# Patient Record
Sex: Female | Born: 1950
Health system: Southern US, Community
[De-identification: ages and names within clinical notes are randomized; demographics above are authoritative.]

## PROBLEM LIST (undated history)

## (undated) DIAGNOSIS — B029 Zoster without complications: Secondary | ICD-10-CM

## (undated) DIAGNOSIS — Z9889 Other specified postprocedural states: Secondary | ICD-10-CM

## (undated) DIAGNOSIS — K2210 Ulcer of esophagus without bleeding: Secondary | ICD-10-CM

## (undated) DIAGNOSIS — E785 Hyperlipidemia, unspecified: Secondary | ICD-10-CM

## (undated) DIAGNOSIS — I10 Essential (primary) hypertension: Secondary | ICD-10-CM

## (undated) DIAGNOSIS — E039 Hypothyroidism, unspecified: Secondary | ICD-10-CM

## (undated) DIAGNOSIS — I5189 Other ill-defined heart diseases: Secondary | ICD-10-CM

## (undated) DIAGNOSIS — C4490 Unspecified malignant neoplasm of skin, unspecified: Secondary | ICD-10-CM

## (undated) DIAGNOSIS — I2510 Atherosclerotic heart disease of native coronary artery without angina pectoris: Secondary | ICD-10-CM

## (undated) DIAGNOSIS — E119 Type 2 diabetes mellitus without complications: Secondary | ICD-10-CM

## (undated) DIAGNOSIS — M1990 Unspecified osteoarthritis, unspecified site: Secondary | ICD-10-CM

## (undated) DIAGNOSIS — T8859XA Other complications of anesthesia, initial encounter: Secondary | ICD-10-CM

## (undated) DIAGNOSIS — I700 Atherosclerosis of aorta: Secondary | ICD-10-CM

## (undated) HISTORY — DX: Ulcer of esophagus without bleeding: K22.10

## (undated) HISTORY — DX: Zoster without complications: B02.9

## (undated) HISTORY — DX: Essential (primary) hypertension: I10

## (undated) HISTORY — DX: Unspecified malignant neoplasm of skin, unspecified: C44.90

## (undated) HISTORY — PX: ABDOMINAL HYSTERECTOMY: SHX81

## (undated) HISTORY — DX: Hyperlipidemia, unspecified: E78.5

---

## 1998-07-28 DIAGNOSIS — K2210 Ulcer of esophagus without bleeding: Secondary | ICD-10-CM

## 1998-07-28 HISTORY — DX: Ulcer of esophagus without bleeding: K22.10

## 1999-07-29 DIAGNOSIS — B029 Zoster without complications: Secondary | ICD-10-CM

## 1999-07-29 HISTORY — DX: Zoster without complications: B02.9

## 2005-01-23 ENCOUNTER — Ambulatory Visit: Payer: Self-pay | Admitting: Unknown Physician Specialty

## 2006-01-01 ENCOUNTER — Ambulatory Visit: Payer: Self-pay | Admitting: General Surgery

## 2006-01-21 ENCOUNTER — Ambulatory Visit: Payer: Self-pay | Admitting: Unknown Physician Specialty

## 2007-03-18 ENCOUNTER — Ambulatory Visit: Payer: Self-pay | Admitting: Unknown Physician Specialty

## 2008-03-30 ENCOUNTER — Ambulatory Visit: Payer: Self-pay | Admitting: Unknown Physician Specialty

## 2009-05-09 ENCOUNTER — Ambulatory Visit: Payer: Self-pay | Admitting: Unknown Physician Specialty

## 2010-07-12 ENCOUNTER — Ambulatory Visit: Payer: Self-pay | Admitting: Unknown Physician Specialty

## 2010-07-17 ENCOUNTER — Ambulatory Visit: Payer: Self-pay | Admitting: Unknown Physician Specialty

## 2010-07-23 ENCOUNTER — Ambulatory Visit: Payer: Self-pay | Admitting: Unknown Physician Specialty

## 2011-01-28 ENCOUNTER — Ambulatory Visit: Payer: Self-pay | Admitting: Internal Medicine

## 2011-05-15 ENCOUNTER — Other Ambulatory Visit: Payer: Self-pay | Admitting: Internal Medicine

## 2011-05-15 MED ORDER — LISINOPRIL 10 MG PO TABS: 10.0000 mg | ORAL_TABLET | Freq: Three times a day (TID) | ORAL | Status: DC

## 2011-05-15 NOTE — Telephone Encounter (Signed)
Patient has signed medical release at [facility], she states [facility] has sent refill request

## 2011-05-16 ENCOUNTER — Other Ambulatory Visit: Payer: Self-pay | Admitting: Internal Medicine

## 2011-05-16 MED ORDER — LISINOPRIL 30 MG PO TABS: 30.0000 mg | ORAL_TABLET | Freq: Every day | ORAL | Status: DC

## 2011-08-01 ENCOUNTER — Encounter: Payer: Self-pay | Admitting: Internal Medicine

## 2011-08-05 ENCOUNTER — Encounter: Payer: Self-pay | Admitting: Internal Medicine

## 2011-08-15 ENCOUNTER — Ambulatory Visit (INDEPENDENT_AMBULATORY_CARE_PROVIDER_SITE_OTHER): Payer: BC Managed Care – PPO | Admitting: Internal Medicine

## 2011-08-15 ENCOUNTER — Other Ambulatory Visit (HOSPITAL_COMMUNITY)
Admission: RE | Admit: 2011-08-15 | Discharge: 2011-08-15 | Disposition: A | Discharge status: Home / Self Care | Payer: BC Managed Care – PPO | Source: Ambulatory Visit | Attending: Internal Medicine | Admitting: Internal Medicine

## 2011-08-15 ENCOUNTER — Encounter: Payer: Self-pay | Admitting: Internal Medicine

## 2011-08-15 DIAGNOSIS — K649 Unspecified hemorrhoids: Secondary | ICD-10-CM

## 2011-08-15 DIAGNOSIS — I10 Essential (primary) hypertension: Secondary | ICD-10-CM | POA: Insufficient documentation

## 2011-08-15 DIAGNOSIS — Z0000 Encounter for general adult medical examination without abnormal findings: Secondary | ICD-10-CM | POA: Insufficient documentation

## 2011-08-15 DIAGNOSIS — Z01419 Encounter for gynecological examination (general) (routine) without abnormal findings: Secondary | ICD-10-CM | POA: Insufficient documentation

## 2011-08-15 DIAGNOSIS — R87810 Cervical high risk human papillomavirus (HPV) DNA test positive: Secondary | ICD-10-CM | POA: Insufficient documentation

## 2011-08-15 DIAGNOSIS — A630 Anogenital (venereal) warts: Secondary | ICD-10-CM | POA: Insufficient documentation

## 2011-08-15 DIAGNOSIS — Z23 Encounter for immunization: Secondary | ICD-10-CM

## 2011-08-15 LAB — COMPREHENSIVE METABOLIC PANEL
AST: 19 U/L (ref 0–37)
Alkaline Phosphatase: 52 U/L (ref 39–117)
BUN: 17 mg/dL (ref 6–23)
Glucose, Bld: 106 mg/dL — ABNORMAL HIGH (ref 70–99)
Potassium: 3.8 mEq/L (ref 3.5–5.3)
Sodium: 139 mEq/L (ref 135–145)
Total Bilirubin: 0.3 mg/dL (ref 0.3–1.2)

## 2011-08-15 MED ORDER — IMIQUIMOD 5 % EX CREA: TOPICAL_CREAM | CUTANEOUS | Status: DC

## 2011-08-15 MED ORDER — LISINOPRIL 30 MG PO TABS: 30.0000 mg | ORAL_TABLET | Freq: Every day | ORAL | Status: DC

## 2011-08-15 NOTE — Progress Notes (Signed)
Subjective:    Patient ID: Alicia Ramos, female    DOB: May 24, 1951, 61 y.o.   MRN: [id number]  HPI 61 year old female with a history of hypertension presents for her annual exam. She reports that she's been doing well. She notes that over the last several months there were 2 instances in which her blood pressure was slightly elevated. In the first instance, she had fallen and injured her back and was in significant pain. She notes that during that time her blood pressure was elevated greater than 140/90. She also notes an instance in which she developed a painful hemorrhoid. She also reports that her blood pressure was elevated during that event. Aside from this, her blood pressure has been well-controlled. She denies any chest pain, palpitations, headache, or shortness of breath. She reports full compliance with her medication.  Last year, she underwent laser ablation of some genital warts. She notes a new lesion in her right groin which she is concerned may be a recurrent wart. This lesion is not painful.  She also notes that a couple of months ago she developed an area to the left of her rectum which was bulging. She assumed that this area was hemorrhoid and started applying Vaseline to the area. She reports significant improvement in the pain from this lesion with the use of Vaseline. She reports that the lesion has gotten gradually smaller. She denies any rectal bleeding. She denies any constipation or diarrhea. She denies any abdominal pain.  Outpatient Encounter Prescriptions as of 08/15/2011  Medication Sig Dispense Refill  . cholecalciferol (VITAMIN D) 1000 UNITS tablet Take 2,000 Units by mouth daily.      Marland Kitchen lisinopril (PRINIVIL,ZESTRIL) 30 MG tablet Take 1 tablet (30 mg total) by mouth daily.  30 tablet  11  . Multiple Vitamins-Minerals (MULTIVITAMIN WITH MINERALS) tablet Take 1 tablet by mouth daily.      . imiquimod (ALDARA) 5 % cream Apply topically 3 (three) times a week.  12 each  0     Review of Systems  Constitutional: Negative for fever, chills, appetite change, fatigue and unexpected weight change.  HENT: Negative for ear pain, congestion, sore throat, trouble swallowing, neck pain, voice change and sinus pressure.   Eyes: Negative for visual disturbance.  Respiratory: Negative for cough, shortness of breath, wheezing and stridor.   Cardiovascular: Negative for chest pain, palpitations and leg swelling.  Gastrointestinal: Positive for rectal pain. Negative for nausea, vomiting, abdominal pain, diarrhea, constipation, blood in stool, abdominal distention and anal bleeding.  Genitourinary: Positive for genital sores. Negative for dysuria and flank pain.  Musculoskeletal: Negative for myalgias, arthralgias and gait problem.  Skin: Negative for color change and rash.  Neurological: Negative for dizziness and headaches.  Hematological: Negative for adenopathy. Does not bruise/bleed easily.  Psychiatric/Behavioral: Negative for suicidal ideas, sleep disturbance and dysphoric mood. The patient is not nervous/anxious.    BP 138/72  Pulse 99  Temp(Src) 97.9 F (36.6 C) (Oral)  Ht 5\' 9"  (1.753 m)  Wt 195 lb (88.451 kg)  BMI 28.80 kg/m2  SpO2 99%    Objective:   Physical Exam  Constitutional: She is oriented to person, place, and time. She appears well-developed and well-nourished. No distress.  HENT:  Head: Normocephalic and atraumatic.  Right Ear: External ear normal.  Left Ear: External ear normal.  Nose: Nose normal.  Mouth/Throat: Oropharynx is clear and moist. No oropharyngeal exudate.  Eyes: Conjunctivae are normal. Pupils are equal, round, and reactive to light. Right eye exhibits no  discharge. Left eye exhibits no discharge. No scleral icterus.  Neck: Normal range of motion. Neck supple. No tracheal deviation present. No thyromegaly present.  Cardiovascular: Normal rate, regular rhythm, normal heart sounds and intact distal pulses.  Exam reveals no gallop  and no friction rub.   No murmur heard. Pulmonary/Chest: Effort normal and breath sounds normal. No respiratory distress. She has no wheezes. She has no rales. She exhibits no tenderness. Right breast exhibits no inverted nipple, no mass, no nipple discharge, no skin change and no tenderness. Left breast exhibits no inverted nipple, no mass, no nipple discharge, no skin change and no tenderness. Breasts are symmetrical.  Abdominal: Soft. Bowel sounds are normal. She exhibits no distension and no mass. There is no tenderness. There is no rebound and no guarding.  Genitourinary: Rectum normal and vagina normal.    No breast swelling, tenderness, discharge or bleeding. Pelvic exam was performed with patient prone. There is no rash, tenderness or lesion on the right labia. There is no rash, tenderness or lesion on the left labia. Right adnexum displays no mass, no tenderness and no fullness. Left adnexum displays no mass, no tenderness and no fullness. No erythema or tenderness around the vagina. No vaginal discharge found.       Cervix is surgically absent. There is vaginal atrophy  Musculoskeletal: Normal range of motion. She exhibits no edema and no tenderness.  Lymphadenopathy:    She has no cervical adenopathy.  Neurological: She is alert and oriented to person, place, and time. No cranial nerve deficit. She exhibits normal muscle tone. Coordination normal.  Skin: Skin is warm and dry. No rash noted. She is not diaphoretic. No erythema. No pallor.  Psychiatric: She has a normal mood and affect. Her behavior is normal. Judgment and thought content normal.          Assessment & Plan:

## 2011-08-15 NOTE — Assessment & Plan Note (Signed)
Pap sent today and is pending. Mammogram is ordered. Influenza vaccine given today.

## 2011-08-15 NOTE — Assessment & Plan Note (Signed)
Patient noted to have hemorrhoid on exam. She has been applying Vaseline to the area and reports significant improvement in her pain. We'll continue to monitor for now. We discussed potentially using Analpram if no improvement.

## 2011-08-15 NOTE — Assessment & Plan Note (Signed)
Blood pressure is well-controlled today. We discussed goal of less than 140/90 with ideal blood pressure of 120/80. Patient will continue to monitor blood pressure at home. She will continue lisinopril. We will check renal function with labs today. She will followup in 6 months.

## 2011-08-15 NOTE — Assessment & Plan Note (Signed)
Patient with a history of genital warts status post laser ablation. She has a new lesion in her right groin. Will try applying Aldara cream. We discussed using this cream 3 times weekly and leaving the cream on for 6-10 hours at a time. She will then washed the area off with warm water. She will call if any problems develop. She will also call if no improvement in the lesion over the next couple of months.

## 2011-08-19 ENCOUNTER — Ambulatory Visit: Payer: Self-pay | Admitting: Internal Medicine

## 2011-08-20 ENCOUNTER — Ambulatory Visit: Payer: Self-pay | Admitting: Internal Medicine

## 2011-08-20 ENCOUNTER — Telehealth: Payer: Self-pay | Admitting: Internal Medicine

## 2011-08-20 NOTE — Telephone Encounter (Signed)
Mammogram and [place] left breast most consistent with inflammatory lymph nodes. Recommended repeat mammogram in 01/2012.

## 2011-08-21 NOTE — Telephone Encounter (Signed)
Mychart message sent.

## 2011-08-25 ENCOUNTER — Telehealth: Payer: Self-pay | Admitting: *Deleted

## 2011-08-25 NOTE — Telephone Encounter (Signed)
Message copied by Vernie Murders on Mon Aug 25, 2011  8:14 AM ------      Message from: [place], Victorino Dike A      Created: Fri Aug 22, 2011  2:58 PM       PAP was normal but HPV was positive. I would like to repeat PAP at visit in 01/2012

## 2011-09-16 ENCOUNTER — Other Ambulatory Visit: Payer: Self-pay | Admitting: Internal Medicine

## 2011-09-30 ENCOUNTER — Encounter: Payer: Self-pay | Admitting: Internal Medicine

## 2011-10-01 ENCOUNTER — Encounter: Payer: Self-pay | Admitting: Internal Medicine

## 2011-12-15 ENCOUNTER — Other Ambulatory Visit: Payer: Self-pay | Admitting: Internal Medicine

## 2011-12-23 ENCOUNTER — Telehealth: Payer: Self-pay | Admitting: Internal Medicine

## 2011-12-23 DIAGNOSIS — R928 Other abnormal and inconclusive findings on diagnostic imaging of breast: Secondary | ICD-10-CM

## 2011-12-23 NOTE — Telephone Encounter (Signed)
Order placed in EMR/SLS

## 2011-12-23 NOTE — Telephone Encounter (Signed)
Alicia Ramos called needing verbal ok to get [name] to come in for 6 month follow up uni left diagnostic

## 2011-12-23 NOTE — Telephone Encounter (Signed)
Fine to order 

## 2012-01-15 ENCOUNTER — Other Ambulatory Visit: Payer: Self-pay | Admitting: Internal Medicine

## 2012-01-20 ENCOUNTER — Telehealth: Payer: Self-pay | Admitting: Internal Medicine

## 2012-01-20 DIAGNOSIS — R928 Other abnormal and inconclusive findings on diagnostic imaging of breast: Secondary | ICD-10-CM

## 2012-01-20 NOTE — Telephone Encounter (Signed)
Its time for pt to have her Uni left diagnostic mammogram need order

## 2012-01-22 NOTE — Telephone Encounter (Signed)
Patient has an appointment on 02/20/12 at 8:30 and she is aware of the appointment.

## 2012-02-12 ENCOUNTER — Ambulatory Visit: Payer: BC Managed Care – PPO | Admitting: Internal Medicine

## 2012-03-09 ENCOUNTER — Ambulatory Visit: Payer: Self-pay

## 2012-03-09 ENCOUNTER — Ambulatory Visit (INDEPENDENT_AMBULATORY_CARE_PROVIDER_SITE_OTHER): Payer: Self-pay | Admitting: Internal Medicine

## 2012-03-09 ENCOUNTER — Encounter: Payer: Self-pay | Admitting: Internal Medicine

## 2012-03-09 VITALS — BP 140/80 | HR 73 | Temp 98.2°F | Ht 69.0 in | Wt 195.2 lb

## 2012-03-09 DIAGNOSIS — B977 Papillomavirus as the cause of diseases classified elsewhere: Secondary | ICD-10-CM

## 2012-03-09 DIAGNOSIS — I10 Essential (primary) hypertension: Secondary | ICD-10-CM

## 2012-03-09 DIAGNOSIS — IMO0002 Reserved for concepts with insufficient information to code with codable children: Secondary | ICD-10-CM | POA: Insufficient documentation

## 2012-03-09 DIAGNOSIS — R8789 Other abnormal findings in specimens from female genital organs: Secondary | ICD-10-CM

## 2012-03-09 DIAGNOSIS — A630 Anogenital (venereal) warts: Secondary | ICD-10-CM

## 2012-03-09 LAB — COMPREHENSIVE METABOLIC PANEL
AST: 22 U/L (ref 0–37)
Albumin: 4.5 g/dL (ref 3.5–5.2)
Alkaline Phosphatase: 61 U/L (ref 39–117)
Glucose, Bld: 89 mg/dL (ref 70–99)
Potassium: 4.1 mEq/L (ref 3.5–5.1)
Sodium: 137 mEq/L (ref 135–145)
Total Protein: 7.7 g/dL (ref 6.0–8.3)

## 2012-03-09 MED ORDER — LISINOPRIL 40 MG PO TABS: 40.0000 mg | ORAL_TABLET | Freq: Every day | ORAL | Status: DC

## 2012-03-09 NOTE — Assessment & Plan Note (Signed)
Pap performed in January 2013 showed normal cytology but was HPV positive. Discussed option of repeating Pap today or waiting until followup visit in January 2014. Patient would prefer to wait. Will plan to repeat Pap at that time.

## 2012-03-09 NOTE — Assessment & Plan Note (Signed)
Blood pressure has been slightly elevated. Will increase dose of lisinopril to 40 mg daily. Patient will monitor blood pressure and call if consistently greater than 140/90. Will check renal function with labs today.

## 2012-03-09 NOTE — Assessment & Plan Note (Signed)
Persistent genital warts despite treatment with Aldara. Recommended referral to GYN for laser removal. Patient would like to hold off for now. Will readdress that visit in 6 months.

## 2012-03-09 NOTE — Progress Notes (Signed)
Subjective:    Patient ID: Alicia Ramos, female    DOB: 11-Nov-1950, 61 y.o.   MRN: [id number]  HPI 61 year old female with history of hypertension presents for followup. She reports she is generally doing well. She notes that her blood pressures at home have been mostly around 140/90. She denies any headache, chest pain, palpitations. She reports full compliance with her medication.  In regards to her history of genital warts, she reports that reports have not resolved with use of Aldara. However, she is not interested in referral for laser removal at this time.   Outpatient Encounter Prescriptions as of 03/09/2012  Medication Sig Dispense Refill  . cholecalciferol (VITAMIN D) 1000 UNITS tablet Take 2,000 Units by mouth daily.      Marland Kitchen lisinopril (PRINIVIL,ZESTRIL) 40 MG tablet Take 1 tablet (40 mg total) by mouth daily.  30 tablet  11  . Multiple Vitamins-Minerals (MULTIVITAMIN WITH MINERALS) tablet Take 1 tablet by mouth daily.      Marland Kitchen DISCONTD: lisinopril (PRINIVIL,ZESTRIL) 30 MG tablet Take 1 tablet (30 mg total) by mouth daily.  30 tablet  11  . DISCONTD: imiquimod (ALDARA) 5 % cream APPLY TOPICALLY 3 TIMES PER WEEK AS     DIRECTED  12 each  2   BP 140/80  Pulse 73  Temp 98.2 F (36.8 C) (Oral)  Ht 5\' 9"  (1.753 m)  Wt 195 lb 4 oz (88.565 kg)  BMI 28.83 kg/m2  SpO2 94%   Review of Systems  Constitutional: Negative for fever, chills, appetite change, fatigue and unexpected weight change.  HENT: Negative for ear pain, congestion, sore throat, trouble swallowing, neck pain, voice change and sinus pressure.   Eyes: Negative for visual disturbance.  Respiratory: Negative for cough, shortness of breath, wheezing and stridor.   Cardiovascular: Negative for chest pain, palpitations and leg swelling.  Gastrointestinal: Negative for nausea, vomiting, abdominal pain, diarrhea, constipation, blood in stool, abdominal distention and anal bleeding.  Genitourinary: Positive for genital sores.  Negative for dysuria and flank pain.  Musculoskeletal: Negative for myalgias, arthralgias and gait problem.  Skin: Negative for color change and rash.  Neurological: Negative for dizziness and headaches.  Hematological: Negative for adenopathy. Does not bruise/bleed easily.  Psychiatric/Behavioral: Negative for suicidal ideas, disturbed wake/sleep cycle and dysphoric mood. The patient is not nervous/anxious.        Objective:   Physical Exam  Constitutional: She is oriented to person, place, and time. She appears well-developed and well-nourished. No distress.  HENT:  Head: Normocephalic and atraumatic.  Right Ear: External ear normal.  Left Ear: External ear normal.  Nose: Nose normal.  Mouth/Throat: Oropharynx is clear and moist. No oropharyngeal exudate.  Eyes: Conjunctivae are normal. Pupils are equal, round, and reactive to light. Right eye exhibits no discharge. Left eye exhibits no discharge. No scleral icterus.  Neck: Normal range of motion. Neck supple. No tracheal deviation present. No thyromegaly present.  Cardiovascular: Normal rate, regular rhythm, normal heart sounds and intact distal pulses.  Exam reveals no gallop and no friction rub.   No murmur heard. Pulmonary/Chest: Effort normal and breath sounds normal. No respiratory distress. She has no wheezes. She has no rales. She exhibits no tenderness.  Musculoskeletal: Normal range of motion. She exhibits no edema and no tenderness.  Lymphadenopathy:    She has no cervical adenopathy.  Neurological: She is alert and oriented to person, place, and time. No cranial nerve deficit. She exhibits normal muscle tone. Coordination normal.  Skin: Skin is warm and  dry. No rash noted. She is not diaphoretic. No erythema. No pallor.  Psychiatric: She has a normal mood and affect. Her behavior is normal. Judgment and thought content normal.          Assessment & Plan:

## 2012-03-09 NOTE — Patient Instructions (Signed)
Supplements which might help with hot flashes - Black Cohash, Soy, I-cool.

## 2012-09-04 LAB — HM MAMMOGRAPHY

## 2012-09-12 ENCOUNTER — Other Ambulatory Visit: Payer: Self-pay

## 2012-09-15 ENCOUNTER — Ambulatory Visit: Payer: Self-pay

## 2013-03-04 ENCOUNTER — Ambulatory Visit (INDEPENDENT_AMBULATORY_CARE_PROVIDER_SITE_OTHER): Payer: Self-pay | Admitting: Internal Medicine

## 2013-03-04 ENCOUNTER — Encounter: Payer: Self-pay | Admitting: Internal Medicine

## 2013-03-04 VITALS — BP 120/80 | HR 73 | Temp 98.3°F | Wt 193.0 lb

## 2013-03-04 DIAGNOSIS — I10 Essential (primary) hypertension: Secondary | ICD-10-CM

## 2013-03-04 DIAGNOSIS — R8789 Other abnormal findings in specimens from female genital organs: Secondary | ICD-10-CM

## 2013-03-04 DIAGNOSIS — B977 Papillomavirus as the cause of diseases classified elsewhere: Secondary | ICD-10-CM

## 2013-03-04 DIAGNOSIS — IMO0002 Reserved for concepts with insufficient information to code with codable children: Secondary | ICD-10-CM

## 2013-03-04 LAB — MICROALBUMIN / CREATININE URINE RATIO
Creatinine,U: 117 mg/dL
Microalb Creat Ratio: 11.4 mg/g (ref 0.0–30.0)
Microalb, Ur: 13.3 mg/dL — ABNORMAL HIGH (ref 0.0–1.9)

## 2013-03-04 LAB — COMPREHENSIVE METABOLIC PANEL
AST: 25 U/L (ref 0–37)
Alkaline Phosphatase: 62 U/L (ref 39–117)
BUN: 16 mg/dL (ref 6–23)
Creatinine, Ser: 1 mg/dL (ref 0.4–1.2)
Glucose, Bld: 108 mg/dL — ABNORMAL HIGH (ref 70–99)
Potassium: 4.9 mEq/L (ref 3.5–5.1)
Total Bilirubin: 0.8 mg/dL (ref 0.3–1.2)

## 2013-03-04 MED ORDER — LISINOPRIL 40 MG PO TABS: 40.0000 mg | ORAL_TABLET | Freq: Every day | ORAL | Status: DC

## 2013-03-04 NOTE — Progress Notes (Signed)
Subjective:    Patient ID: Alicia Ramos, female    DOB: 1950/11/26, 62 y.o.   MRN: [id number]  HPI 62 year old female with history of hypertension and abnormal Pap smear presents for followup. She reports she has been lost to followup because she lost her job and did not have health insurance. She has been compliant with her lisinopril. She has not been checking her blood pressure at home. She denies any headache, chest pain, palpitations. She has been feeling well. She is exercising by walking daily. She is trying to follow a healthy diet.  In regards to Pap smear she did not followup with GYN. She reports she has been seen at [facility] as part of a [facility] care program. She underwent mammogram which was reportedly normal. She reports that they have offered to complete her Pap smear there. This has not yet been done.  Outpatient Prescriptions Prior to Visit  Medication Sig Dispense Refill  . cholecalciferol (VITAMIN D) 1000 UNITS tablet Take 2,000 Units by mouth daily.      . Multiple Vitamins-Minerals (MULTIVITAMIN WITH MINERALS) tablet Take 1 tablet by mouth daily.      Marland Kitchen lisinopril (PRINIVIL,ZESTRIL) 40 MG tablet Take 1 tablet (40 mg total) by mouth daily.  30 tablet  11   No facility-administered medications prior to visit.    Review of Systems  Constitutional: Negative for fever, chills, appetite change, fatigue and unexpected weight change.  HENT: Negative for ear pain, congestion, sore throat, trouble swallowing, neck pain, voice change and sinus pressure.   Eyes: Negative for visual disturbance.  Respiratory: Negative for cough, shortness of breath, wheezing and stridor.   Cardiovascular: Negative for chest pain, palpitations and leg swelling.  Gastrointestinal: Negative for nausea, vomiting, abdominal pain, diarrhea, constipation, blood in stool, abdominal distention and anal bleeding.  Genitourinary: Negative for dysuria and flank pain.  Musculoskeletal:  Negative for myalgias, arthralgias and gait problem.  Skin: Negative for color change and rash.  Neurological: Negative for dizziness and headaches.  Hematological: Negative for adenopathy. Does not bruise/bleed easily.  Psychiatric/Behavioral: Negative for suicidal ideas, sleep disturbance and dysphoric mood. The patient is not nervous/anxious.        Objective:   Physical Exam  Constitutional: She is oriented to person, place, and time. She appears well-developed and well-nourished. No distress.  HENT:  Head: Normocephalic and atraumatic.  Right Ear: External ear normal.  Left Ear: External ear normal.  Nose: Nose normal.  Mouth/Throat: Oropharynx is clear and moist. No oropharyngeal exudate.  Eyes: Conjunctivae are normal. Pupils are equal, round, and reactive to light. Right eye exhibits no discharge. Left eye exhibits no discharge. No scleral icterus.  Neck: Normal range of motion. Neck supple. No tracheal deviation present. No thyromegaly present.  Cardiovascular: Normal rate, regular rhythm, normal heart sounds and intact distal pulses.  Exam reveals no gallop and no friction rub.   No murmur heard. Pulmonary/Chest: Effort normal and breath sounds normal. No accessory muscle usage. Not tachypneic. No respiratory distress. She has no decreased breath sounds. She has no wheezes. She has no rhonchi. She has no rales. She exhibits no tenderness.  Musculoskeletal: Normal range of motion. She exhibits no edema and no tenderness.  Lymphadenopathy:    She has no cervical adenopathy.  Neurological: She is alert and oriented to person, place, and time. No cranial nerve deficit. She exhibits normal muscle tone. Coordination normal.  Skin: Skin is warm and dry. No rash noted. She is not diaphoretic. No erythema.  No pallor.  Psychiatric: She has a normal mood and affect. Her behavior is normal. Judgment and thought content normal.          Assessment & Plan:

## 2013-03-04 NOTE — Assessment & Plan Note (Signed)
BP Readings from Last 3 Encounters:  03/04/13 120/80  03/09/12 140/80  08/15/11 138/72   Blood pressure well-controlled on lisinopril. Will check renal function and urine micro-albumen with labs today.

## 2013-03-04 NOTE — Assessment & Plan Note (Signed)
Pap smear from January 2013 was normal but HPV was positive. We discussed the importance of repeating Pap smear to be sure that she has cleared HPV infection. She would like to hold off on this for now asshe does not have health insurance coverage. Encouraged her to either schedule Pap through the [facility] care program at [facility] or to establish [facility] care through [place]. Alternatively she could have Pap smear performed at the health department. She would prefer to set up [facility] care through [facility] and we will schedule tentative physical exam with Pap for 4 weeks from now.

## 2013-05-10 ENCOUNTER — Encounter: Payer: Self-pay | Admitting: Internal Medicine

## 2013-05-10 ENCOUNTER — Ambulatory Visit (INDEPENDENT_AMBULATORY_CARE_PROVIDER_SITE_OTHER): Payer: Self-pay | Admitting: Internal Medicine

## 2013-05-10 VITALS — BP 120/76 | HR 80 | Temp 97.8°F | Resp 12 | Wt 197.5 lb

## 2013-05-10 DIAGNOSIS — R05 Cough: Secondary | ICD-10-CM | POA: Insufficient documentation

## 2013-05-10 DIAGNOSIS — R059 Cough, unspecified: Secondary | ICD-10-CM

## 2013-05-10 MED ORDER — BENZONATATE 100 MG PO CAPS: 100.0000 mg | ORAL_CAPSULE | Freq: Three times a day (TID) | ORAL | Status: DC | PRN

## 2013-05-10 MED ORDER — ZOSTER VACCINE LIVE 19400 UNT/0.65ML ~~LOC~~ SOLR: 0.6500 mL | Freq: Once | SUBCUTANEOUS | Status: DC

## 2013-05-10 MED ORDER — PNEUMOCOCCAL 7-VAL CONJ VACC 16 MCG/0.5ML IM SUSP: 0.5000 mL | Freq: Once | INTRAMUSCULAR | Status: DC

## 2013-05-10 MED ORDER — TETANUS-DIPHTH-ACELL PERTUSSIS 5-2.5-18.5 LF-MCG/0.5 IM SUSP: 0.5000 mL | Freq: Once | INTRAMUSCULAR | Status: DC

## 2013-05-10 NOTE — Patient Instructions (Signed)
Your cough may be coming from a viral infection and  post nasal drip (PND).  PND  can be treated with benadryl,  Allegra, Zyrtec and claritin. Benadryl is the most effective  but it is also the most sedating,  So try taking it at night and use one of the others in the daytime  "DM" stands for dextromethorphan which is a cough suppressant.  The best/strongest available OTC cough suppressant is Delsym.  If the cough does not improve  With these measures you can take the tessalon cough capsule I gave you an rx for   Please call yus back in 10 days if the cough has not resolved.   Consider using simply Saline or Neil's sinus rinse to flush your sinuses twice daily  During the cold and flu season to prevent sinus infections    I recommend that you have the following vaccines this year (or at some point);  Flu,  TDaP,  Prevnar, and Shingles    I have given you prescriptions for thses because they will be cheaper at the health Dept

## 2013-05-10 NOTE — Progress Notes (Signed)
Patient ID: Alicia Ramos, female   DOB: [birth year], 62 y.o.   MRN: [id number]   Patient Active Problem List   Diagnosis Date Noted  . Cough 05/10/2013  . Abnormal cervical Pap smear with positive HPV DNA test 03/09/2012  . Hypertension 08/15/2011  . Genital warts 08/15/2011  . Hemorrhoid 08/15/2011    Subjective:  CC:   Chief Complaint  Patient presents with  . Cough    x 2 weeks    HPI:   Alicia Hawkinsis a 62 y.o. female who presents with cough x 2 weeks started  with a tickle,  Then sore throat,  Then lost her voice.   Hasn't felt bad,  just aggravating.  Last night was the first night she slept without coughing.  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 She is not insured currently.    Using Ricola cough drops and hot tea    Past Medical History  Diagnosis Date  . HTN (hypertension)   . Hyperlipidemia     Tried statin but had severe myalgia  . Esophageal ulcer 2000    Required blood transfusion  . Shingles 2001    Left chest  . Vaginal delivery     x1    Past Surgical History  Procedure Laterality Date  . Abdominal hysterectomy         The following portions of the patient's history were reviewed and updated as appropriate: Allergies, current medications, and problem list.    Review of Systems:   12 Pt  review of systems was negative except those addressed in the HPI,     History   Social History  . Marital Status: Single    Spouse Name: N/A    Number of Children: N/A  . Years of Education: N/A   Occupational History  . Not on file.   Social History Main Topics  . Smoking status: Former Smoker    Quit date: 08/01/2003  . Smokeless tobacco: Not on file  . Alcohol Use: Yes     Comment: Occasional  . Drug Use: Not on file  . Sexual Activity: Not Currently   Other Topics Concern  . Not on file   Social History Narrative   Regular Exercise -  Yes   Lives in  [place] w/son          Objective:  Filed Vitals:   05/10/13 0756  BP: 120/76  Pulse: 80  Temp: 97.8 F (36.6 C)  Resp: 12     General appearance: alert, cooperative and appears stated age Ears: normal TM's and external ear canals both ears Throat: lips, mucosa, and tongue normal; teeth and gums normal Neck: no adenopathy, no carotid bruit, supple, symmetrical, trachea midline and thyroid not enlarged, symmetric, no tenderness/mass/nodules Back: symmetric, no curvature. ROM normal. No CVA tenderness. Lungs: clear to auscultation bilaterally Heart: regular rate and rhythm, S1, S2 normal, no murmur, click, rub or gallop Abdomen: soft, non-tender; bowel sounds normal; no masses,  no organomegaly Pulses: 2+ and symmetric Skin: Skin color, texture, turgor normal. No rashes or lesions Lymph nodes: Cervical, supraclavicular, and axillary nodes normal.  Assessment and Plan:  Cough Discussed the possible etiologies of persistent cough including but not limited to post nasal drip,  Allergic rhinitis,  Asthma,  GERD and chronic cyclic cough due to persistent irritation.  She is taking an ACE Inhibitor.  Suggested treating for allergic rhinitis,  PND  and GERD to break cycle and reevaluation in a few weeks.    Updated Medication List Outpatient Encounter Prescriptions as of 05/10/2013  Medication Sig Dispense Refill  . cholecalciferol (VITAMIN D) 1000 UNITS tablet Take 2,000 Units by mouth daily.      Marland Kitchen lisinopril (PRINIVIL,ZESTRIL) 40 MG tablet Take 1 tablet (40 mg total) by mouth daily.  30 tablet  11  . Multiple Vitamins-Minerals (MULTIVITAMIN WITH MINERALS) tablet Take 1 tablet by mouth daily.      . benzonatate (TESSALON) 100 MG capsule Take 1 capsule (100 mg total) by mouth 3 (three) times daily as needed for cough.  30 capsule  0  . pneumococcal 7-valent conjugate (PREVNAR) 16 MCG/0.5ML injection Inject 0.5 mLs into the muscle once.  0.5 mL  0  . TDaP (BOOSTRIX) 5-2.5-18.5  LF-MCG/0.5 injection Inject 0.5 mLs into the muscle once.  0.5 mL  0  . zoster vaccine live, PF, (ZOSTAVAX) 16109 UNT/0.65ML injection Inject 19,400 Units into the skin once.  1 each  0   No facility-administered encounter medications on file as of 05/10/2013.     No orders of the defined types were placed in this encounter.    No Follow-up on file.      Marland Kitchen

## 2013-05-11 ENCOUNTER — Encounter: Payer: Self-pay | Admitting: Internal Medicine

## 2013-05-11 NOTE — Assessment & Plan Note (Signed)
Discussed the possible etiologies of persistent cough including but not limited to post nasal drip,  Allergic rhinitis,  Asthma,  GERD and chronic cyclic cough due to persistent irritation.  She is taking an ACE Inhibitor.  Suggested treating for allergic rhinitis,  PND and GERD to break cycle and reevaluation in a few weeks.

## 2013-06-02 ENCOUNTER — Other Ambulatory Visit: Payer: Self-pay

## 2013-07-14 ENCOUNTER — Encounter: Payer: Self-pay | Admitting: Internal Medicine

## 2013-07-14 ENCOUNTER — Ambulatory Visit (INDEPENDENT_AMBULATORY_CARE_PROVIDER_SITE_OTHER): Payer: Self-pay | Admitting: Internal Medicine

## 2013-07-14 ENCOUNTER — Encounter: Payer: Self-pay | Admitting: *Deleted

## 2013-07-14 ENCOUNTER — Other Ambulatory Visit (HOSPITAL_COMMUNITY)
Admission: RE | Admit: 2013-07-14 | Discharge: 2013-07-14 | Disposition: A | Discharge status: Home / Self Care | Payer: Self-pay | Source: Ambulatory Visit | Attending: Internal Medicine | Admitting: Internal Medicine

## 2013-07-14 VITALS — BP 124/80 | HR 75 | Temp 97.8°F | Ht 68.0 in | Wt 198.0 lb

## 2013-07-14 DIAGNOSIS — Z0000 Encounter for general adult medical examination without abnormal findings: Secondary | ICD-10-CM

## 2013-07-14 DIAGNOSIS — Z01419 Encounter for gynecological examination (general) (routine) without abnormal findings: Secondary | ICD-10-CM | POA: Insufficient documentation

## 2013-07-14 DIAGNOSIS — I10 Essential (primary) hypertension: Secondary | ICD-10-CM

## 2013-07-14 DIAGNOSIS — Z1151 Encounter for screening for human papillomavirus (HPV): Secondary | ICD-10-CM | POA: Insufficient documentation

## 2013-07-14 LAB — COMPREHENSIVE METABOLIC PANEL
ALT: 20 U/L (ref 0–35)
AST: 18 U/L (ref 0–37)
Albumin: 4.5 g/dL (ref 3.5–5.2)
Alkaline Phosphatase: 62 U/L (ref 39–117)
Calcium: 9.5 mg/dL (ref 8.4–10.5)
Chloride: 101 mEq/L (ref 96–112)
Potassium: 4.4 mEq/L (ref 3.5–5.1)
Sodium: 136 mEq/L (ref 135–145)

## 2013-07-14 NOTE — Progress Notes (Signed)
Pre-visit discussion using our clinic review tool. No additional management support is needed unless otherwise documented below in the visit note.  

## 2013-07-15 DIAGNOSIS — Z0000 Encounter for general adult medical examination without abnormal findings: Secondary | ICD-10-CM | POA: Insufficient documentation

## 2013-07-15 NOTE — Progress Notes (Signed)
Subjective:    Patient ID: Alicia Ramos, female    DOB: 02/23/1951, 62 y.o.   MRN: [id number]  HPI 62 year old female with history of hypertension and hyperlipidemia presents for annual exam. She reports that she is generally feeling well. She continues to be without health insurance. She is planning to wait until she turns 64 to start Medicare. For this reason, she has delayed routine lab work and repeat Pap smear. She did have a mammogram performed earlier this year through a charity program at [facility] and reports this was normal.  Outpatient Encounter Prescriptions as of 07/14/2013  Medication Sig  . cholecalciferol (VITAMIN D) 1000 UNITS tablet Take 2,000 Units by mouth daily.  Marland Kitchen lisinopril (PRINIVIL,ZESTRIL) 40 MG tablet Take 1 tablet (40 mg total) by mouth daily.  . Multiple Vitamins-Minerals (MULTIVITAMIN WITH MINERALS) tablet Take 1 tablet by mouth daily.  . Omega-3 Fatty Acids (FISH OIL) 1000 MG CAPS Take by mouth.   BP 124/80  Pulse 75  Temp(Src) 97.8 F (36.6 C) (Oral)  Ht 5\' 8"  (1.727 m)  Wt 198 lb (89.812 kg)  BMI 30.11 kg/m2  SpO2 99%  Review of Systems  Constitutional: Negative for fever, chills, appetite change, fatigue and unexpected weight change.  HENT: Negative for congestion, ear pain, sinus pressure, sore throat, trouble swallowing and voice change.   Eyes: Negative for visual disturbance.  Respiratory: Negative for cough, shortness of breath, wheezing and stridor.   Cardiovascular: Negative for chest pain, palpitations and leg swelling.  Gastrointestinal: Negative for nausea, vomiting, abdominal pain, diarrhea, constipation, blood in stool, abdominal distention and anal bleeding.  Genitourinary: Negative for dysuria and flank pain.  Musculoskeletal: Negative for arthralgias, gait problem, myalgias and neck pain.  Skin: Negative for color change and rash.  Neurological: Negative for dizziness and headaches.  Hematological: Negative  for adenopathy. Does not bruise/bleed easily.  Psychiatric/Behavioral: Negative for suicidal ideas, sleep disturbance and dysphoric mood. The patient is not nervous/anxious.        Objective:   Physical Exam  Constitutional: She is oriented to person, place, and time. She appears well-developed and well-nourished. No distress.  HENT:  Head: Normocephalic and atraumatic.  Right Ear: External ear normal.  Left Ear: External ear normal.  Nose: Nose normal.  Mouth/Throat: Oropharynx is clear and moist. No oropharyngeal exudate.  Eyes: Conjunctivae are normal. Pupils are equal, round, and reactive to light. Right eye exhibits no discharge. Left eye exhibits no discharge. No scleral icterus.  Neck: Normal range of motion. Neck supple. No tracheal deviation present. No thyromegaly present.  Cardiovascular: Normal rate, regular rhythm, normal heart sounds and intact distal pulses.  Exam reveals no gallop and no friction rub.   No murmur heard. Pulmonary/Chest: Effort normal and breath sounds normal. No respiratory distress. She has no wheezes. She has no rales. She exhibits no tenderness.  Abdominal: Soft. Bowel sounds are normal. She exhibits no distension and no mass. There is no tenderness. There is no rebound and no guarding.  Genitourinary: Rectum normal, vagina normal and uterus normal. No breast swelling, tenderness, discharge or bleeding. Pelvic exam was performed with patient supine. There is no rash, tenderness or lesion on the right labia. There is no rash, tenderness or lesion on the left labia. Uterus is not enlarged and not tender. Cervix exhibits no motion tenderness, no discharge and no friability. Right adnexum displays no mass, no tenderness and no fullness. Left adnexum displays no mass, no tenderness and no fullness. No erythema or  tenderness around the vagina. No vaginal discharge found.  Musculoskeletal: Normal range of motion. She exhibits no edema and no tenderness.    Lymphadenopathy:    She has no cervical adenopathy.  Neurological: She is alert and oriented to person, place, and time. No cranial nerve deficit. She exhibits normal muscle tone. Coordination normal.  Skin: Skin is warm and dry. No rash noted. She is not diaphoretic. No erythema. No pallor.  Psychiatric: She has a normal mood and affect. Her behavior is normal. Judgment and thought content normal.          Assessment & Plan:

## 2013-07-15 NOTE — Assessment & Plan Note (Signed)
General medical exam including breast and pelvic exam normal today. Pap is pending. Will request recent mammogram report. Colonoscopy is up-to-date. Immunizations are up-to-date. Kidney, liver function normal on labs today. Holding off on additional lab testing given that patient does not have insurance coverage. Encouraged her to apply for [facility] care. Encouraged healthy diet and regular exercise. Follow up 6-12 months and prn.

## 2013-07-15 NOTE — Assessment & Plan Note (Signed)
BP Readings from Last 3 Encounters:  07/14/13 124/80  05/10/13 120/76  03/04/13 120/80   BP well controlled on lisinopril. Renal function normal on labs today. Continue current medication.

## 2013-07-19 ENCOUNTER — Encounter: Payer: Self-pay | Admitting: *Deleted

## 2013-07-25 LAB — HM PAP SMEAR: HM Pap smear: NEGATIVE

## 2013-09-05 ENCOUNTER — Ambulatory Visit: Payer: Self-pay

## 2014-03-08 ENCOUNTER — Other Ambulatory Visit: Payer: Self-pay | Admitting: Internal Medicine

## 2014-03-15 ENCOUNTER — Telehealth: Payer: Self-pay | Admitting: Internal Medicine

## 2014-03-15 ENCOUNTER — Ambulatory Visit (INDEPENDENT_AMBULATORY_CARE_PROVIDER_SITE_OTHER): Payer: BC Managed Care – PPO | Admitting: Internal Medicine

## 2014-03-15 ENCOUNTER — Encounter: Payer: Self-pay | Admitting: Internal Medicine

## 2014-03-15 VITALS — BP 112/64 | HR 77 | Temp 97.9°F | Ht 68.0 in | Wt 201.8 lb

## 2014-03-15 DIAGNOSIS — E669 Obesity, unspecified: Secondary | ICD-10-CM

## 2014-03-15 DIAGNOSIS — I10 Essential (primary) hypertension: Secondary | ICD-10-CM

## 2014-03-15 MED ORDER — LOSARTAN POTASSIUM 50 MG PO TABS: 50.0000 mg | ORAL_TABLET | Freq: Every day | ORAL | Status: DC

## 2014-03-15 NOTE — Progress Notes (Signed)
Subjective:    Patient ID: Alicia Ramos, female    DOB: 08/19/1950, 63 y.o.   MRN: [id number]  HPI 63YO female presents for follow up.  HTN - Compliant with medication.Continues to have annoying cough. Questions if related to lisinopril. Cough is dry annoying.  Concerned about weight gain. Not following any exercise or diet program, but has started swimming.  Review of Systems  Constitutional: Negative for fever, chills, appetite change, fatigue and unexpected weight change.  Eyes: Negative for visual disturbance.  Respiratory: Positive for cough. Negative for shortness of breath.   Cardiovascular: Negative for chest pain and leg swelling.  Gastrointestinal: Negative for abdominal pain.  Skin: Negative for color change and rash.  Hematological: Negative for adenopathy. Does not bruise/bleed easily.  Psychiatric/Behavioral: Negative for dysphoric mood. The patient is not nervous/anxious.        Objective:    BP 112/64  Pulse 77  Temp(Src) 97.9 F (36.6 C) (Oral)  Ht _0  (1.727 m)  Wt 201 lb 12 oz (91.513 kg)  BMI 30.68 kg/m2  SpO2 98% Physical Exam  Constitutional: She is oriented to person, place, and time. She appears well-developed and well-nourished. No distress.  HENT:  Head: Normocephalic and atraumatic.  Right Ear: External ear normal.  Left Ear: External ear normal.  Nose: Nose normal.  Mouth/Throat: Oropharynx is clear and moist. No oropharyngeal exudate.  Eyes: Conjunctivae are normal. Pupils are equal, round, and reactive to light. Right eye exhibits no discharge. Left eye exhibits no discharge. No scleral icterus.  Neck: Normal range of motion. Neck supple. No tracheal deviation present. No thyromegaly present.  Cardiovascular: Normal rate, regular rhythm, normal heart sounds and intact distal pulses.  Exam reveals no gallop and no friction rub.   No murmur heard. Pulmonary/Chest: Effort normal and breath sounds normal. No accessory muscle usage. Not  tachypneic. No respiratory distress. She has no decreased breath sounds. She has no wheezes. She has no rhonchi. She has no rales. She exhibits no tenderness.  Musculoskeletal: Normal range of motion. She exhibits no edema and no tenderness.  Lymphadenopathy:    She has no cervical adenopathy.  Neurological: She is alert and oriented to person, place, and time. No cranial nerve deficit. She exhibits normal muscle tone. Coordination normal.  Skin: Skin is warm and dry. No rash noted. She is not diaphoretic. No erythema. No pallor.  Psychiatric: She has a normal mood and affect. Her behavior is normal. Judgment and thought content normal.          Assessment & Plan:   Problem List Items Addressed This Visit     Unprioritized   Hypertension - Primary      BP Readings from Last 3 Encounters:  03/15/14 112/64  07/14/13 124/80  05/10/13 120/76   BP well controlled, however having cough with Lisinopril. Will change to Losartan 76m daily. Check renal function with labs next week.    Relevant Medications      losartan (COZAAR) tablet   Other Relevant Orders      Comp Met (CMET)   Obesity (BMI 30-39.9)      Wt Readings from Last 3 Encounters:  03/15/14 201 lb 12 oz (91.513 kg)  07/14/13 198 lb (89.812 kg)  05/10/13 197 lb 8 oz (89.585 kg)   Body mass index is 30.68 kg/(m^2). Encouraged healthy diet and regular exercise with goal of weight loss.        Return in about 6 months (around 09/15/2014) for Physical.

## 2014-03-15 NOTE — Patient Instructions (Signed)
Stop Lisinopril.  Start Losartan 50mg  daily.  Labs next week to check potassium and kidney function.  Follow up in 4 weeks.

## 2014-03-15 NOTE — Telephone Encounter (Signed)
Relevant patient education assigned to patient using Emmi. ° °

## 2014-03-15 NOTE — Assessment & Plan Note (Signed)
BP Readings from Last 3 Encounters:  03/15/14 112/64  07/14/13 124/80  05/10/13 120/76   BP well controlled, however having cough with Lisinopril. Will change to Losartan 50mg  daily. Check renal function with labs next week.

## 2014-03-15 NOTE — Progress Notes (Signed)
Pre visit review using our clinic review tool, if applicable. No additional management support is needed unless otherwise documented below in the visit note. 

## 2014-03-15 NOTE — Assessment & Plan Note (Signed)
Wt Readings from Last 3 Encounters:  03/15/14 201 lb 12 oz (91.513 kg)  07/14/13 198 lb (89.812 kg)  05/10/13 197 lb 8 oz (89.585 kg)   Body mass index is 30.68 kg/(m^2). Encouraged healthy diet and regular exercise with goal of weight loss.

## 2014-03-22 ENCOUNTER — Other Ambulatory Visit: Payer: Self-pay | Admitting: *Deleted

## 2014-03-22 ENCOUNTER — Other Ambulatory Visit (INDEPENDENT_AMBULATORY_CARE_PROVIDER_SITE_OTHER): Payer: BC Managed Care – PPO

## 2014-03-22 DIAGNOSIS — I10 Essential (primary) hypertension: Secondary | ICD-10-CM

## 2014-03-22 DIAGNOSIS — R899 Unspecified abnormal finding in specimens from other organs, systems and tissues: Secondary | ICD-10-CM

## 2014-03-22 LAB — COMPREHENSIVE METABOLIC PANEL
ALK PHOS: 63 U/L (ref 39–117)
ALT: 19 U/L (ref 0–35)
AST: 18 U/L (ref 0–37)
Albumin: 4.3 g/dL (ref 3.5–5.2)
BILIRUBIN TOTAL: 0.4 mg/dL (ref 0.2–1.2)
BUN: 16 mg/dL (ref 6–23)
CO2: 29 mEq/L (ref 19–32)
CREATININE: 0.8 mg/dL (ref 0.4–1.2)
Calcium: 9.4 mg/dL (ref 8.4–10.5)
Chloride: 98 mEq/L (ref 96–112)
GFR: 75.6 mL/min (ref >60.00)
GLUCOSE: 98 mg/dL (ref 70–99)
Potassium: 5.1 mEq/L (ref 3.5–5.1)
Sodium: 133 mEq/L — ABNORMAL LOW (ref 135–145)
TOTAL PROTEIN: 7.6 g/dL (ref 6.0–8.3)

## 2014-03-24 ENCOUNTER — Other Ambulatory Visit (INDEPENDENT_AMBULATORY_CARE_PROVIDER_SITE_OTHER): Payer: BC Managed Care – PPO

## 2014-03-24 DIAGNOSIS — R6889 Other general symptoms and signs: Secondary | ICD-10-CM

## 2014-03-24 DIAGNOSIS — R899 Unspecified abnormal finding in specimens from other organs, systems and tissues: Secondary | ICD-10-CM

## 2014-03-24 LAB — BASIC METABOLIC PANEL
BUN: 15 mg/dL (ref 6–23)
CALCIUM: 9.8 mg/dL (ref 8.4–10.5)
CHLORIDE: 99 meq/L (ref 96–112)
CO2: 27 meq/L (ref 19–32)
Creatinine, Ser: 0.9 mg/dL (ref 0.4–1.2)
GFR: 66.1 mL/min (ref >60.00)
Glucose, Bld: 103 mg/dL — ABNORMAL HIGH (ref 70–99)
POTASSIUM: 5.2 meq/L — AB (ref 3.5–5.1)
SODIUM: 136 meq/L (ref 135–145)

## 2014-03-28 ENCOUNTER — Telehealth: Payer: Self-pay | Admitting: *Deleted

## 2014-03-28 NOTE — Telephone Encounter (Signed)
We cannot change back to Lisinopril if she had a cough with this medication. We should consider increasing dose of Losartan if BP poorly controlled. What has her BP been running?

## 2014-03-28 NOTE — Telephone Encounter (Signed)
Let's set up a follow up appointment to decide about changing medication versus trying a higher dose of Losartan

## 2014-03-28 NOTE — Telephone Encounter (Signed)
Pt states that her BP today at lunch was 134/84.  Pt states that "blood pressure is not as good on the losartan as it was on the lisinopril, it's not super high, but the constant headaches is what I can not deal with".  Pt states that she did not have headaches when she was on the higher dose of losartan but she had to reduce the dose due to labs.

## 2014-03-28 NOTE — Telephone Encounter (Signed)
Pt states that Losartan is not controlling her BP, she would rather deal with the cough that she had with Lisinopril. Pt is requesting to be changed back to Lisinopril.

## 2014-03-29 NOTE — Telephone Encounter (Signed)
Notified pt. 

## 2014-03-30 ENCOUNTER — Ambulatory Visit (INDEPENDENT_AMBULATORY_CARE_PROVIDER_SITE_OTHER): Payer: BC Managed Care – PPO | Admitting: Internal Medicine

## 2014-03-30 ENCOUNTER — Ambulatory Visit: Payer: BC Managed Care – PPO | Admitting: Internal Medicine

## 2014-03-30 ENCOUNTER — Encounter: Payer: Self-pay | Admitting: Internal Medicine

## 2014-03-30 VITALS — BP 130/76 | HR 71 | Temp 97.7°F | Ht 68.0 in | Wt 198.2 lb

## 2014-03-30 DIAGNOSIS — I10 Essential (primary) hypertension: Secondary | ICD-10-CM

## 2014-03-30 MED ORDER — METOPROLOL SUCCINATE ER 25 MG PO TB24: 25.0000 mg | ORAL_TABLET | Freq: Every day | ORAL | Status: DC

## 2014-03-30 NOTE — Patient Instructions (Addendum)
Start Metoprolol 25mg  daily.  Email or call with blood pressure readings.  Follow up in 4 weeks or sooner as needed.

## 2014-03-30 NOTE — Assessment & Plan Note (Signed)
BP Readings from Last 3 Encounters:  03/30/14 130/76  03/15/14 112/64  07/14/13 124/80   BP well controlled on Losartan but having hyperkalemia and headache on this medication. Will change to Metoprolol 25mg  daily. She will email with BP readings and follow up in 4 weeks for recheck.

## 2014-03-30 NOTE — Progress Notes (Signed)
Pre visit review using our clinic review tool, if applicable. No additional management support is needed unless otherwise documented below in the visit note. 

## 2014-03-30 NOTE — Progress Notes (Signed)
   Subjective:    Patient ID: Alicia Ramos, female    DOB: [birth year], 63 y.o.   MRN: [id number]  HPI 63YO female presents for follow up. Feeling well in general. Notes almost daily mild headaches since starting her Losartan. Not taking anything for headache pain. Had to decrease dose on Losartan because of hyperkalemia. Cough has completely resolved after stopping Lisinopril. Started exercise program, walking 3 miles daily and going to water aerobics. Also starting Yoga.   Review of Systems  Constitutional: Negative for fever, chills, appetite change, fatigue and unexpected weight change.  Eyes: Negative for visual disturbance.  Respiratory: Negative for shortness of breath.   Cardiovascular: Negative for chest pain and leg swelling.  Gastrointestinal: Negative for abdominal pain.  Skin: Negative for color change and rash.  Neurological: Positive for headaches.  Hematological: Negative for adenopathy. Does not bruise/bleed easily.  Psychiatric/Behavioral: Negative for dysphoric mood. The patient is not nervous/anxious.        Objective:    BP 130/76  Pulse 71  Temp(Src) 97.7 F (36.5 C) (Oral)  Ht 5\' 8"  (1.727 m)  Wt 198 lb 4 oz (89.926 kg)  BMI 30.15 kg/m2  SpO2 97% Physical Exam  Constitutional: She is oriented to person, place, and time. She appears well-developed and well-nourished. No distress.  HENT:  Head: Normocephalic and atraumatic.  Right Ear: External ear normal.  Left Ear: External ear normal.  Nose: Nose normal.  Mouth/Throat: Oropharynx is clear and moist.  Eyes: Conjunctivae are normal. Pupils are equal, round, and reactive to light. Right eye exhibits no discharge. Left eye exhibits no discharge. No scleral icterus.  Neck: Normal range of motion. Neck supple. No tracheal deviation present. No thyromegaly present.  Cardiovascular: Normal rate, regular rhythm, normal heart sounds and intact distal pulses.  Exam reveals no gallop and no friction rub.   No  murmur heard. Pulmonary/Chest: Effort normal and breath sounds normal. No accessory muscle usage. Not tachypneic. No respiratory distress. She has no decreased breath sounds. She has no wheezes. She has no rhonchi. She has no rales. She exhibits no tenderness.  Musculoskeletal: Normal range of motion. She exhibits no edema and no tenderness.  Lymphadenopathy:    She has no cervical adenopathy.  Neurological: She is alert and oriented to person, place, and time. No cranial nerve deficit. She exhibits normal muscle tone. Coordination normal.  Skin: Skin is warm and dry. No rash noted. She is not diaphoretic. No erythema. No pallor.  Psychiatric: She has a normal mood and affect. Her behavior is normal. Judgment and thought content normal.          Assessment & Plan:   Problem List Items Addressed This Visit     Unprioritized   Hypertension - Primary      BP Readings from Last 3 Encounters:  03/30/14 130/76  03/15/14 112/64  07/14/13 124/80   BP well controlled on Losartan but having hyperkalemia and headache on this medication. Will change to Metoprolol 25mg  daily. She will email with BP readings and follow up in 4 weeks for recheck.    Relevant Medications      metoprolol succinate (TOPROL-XL) 24 hr tablet       Return in about 4 weeks (around 04/27/2014) for Recheck of Blood Pressure.

## 2014-03-31 ENCOUNTER — Other Ambulatory Visit: Payer: BC Managed Care – PPO

## 2014-04-12 ENCOUNTER — Ambulatory Visit: Payer: BC Managed Care – PPO | Admitting: Internal Medicine

## 2014-05-04 ENCOUNTER — Ambulatory Visit (INDEPENDENT_AMBULATORY_CARE_PROVIDER_SITE_OTHER): Payer: BC Managed Care – PPO | Admitting: Internal Medicine

## 2014-05-04 ENCOUNTER — Encounter: Payer: Self-pay | Admitting: Internal Medicine

## 2014-05-04 VITALS — BP 120/78 | HR 76 | Temp 97.5°F | Ht 68.0 in | Wt 196.2 lb

## 2014-05-04 DIAGNOSIS — E669 Obesity, unspecified: Secondary | ICD-10-CM

## 2014-05-04 DIAGNOSIS — I10 Essential (primary) hypertension: Secondary | ICD-10-CM

## 2014-05-04 NOTE — Assessment & Plan Note (Signed)
Wt Readings from Last 3 Encounters:  05/04/14 196 lb 4 oz (89.018 kg)  03/30/14 198 lb 4 oz (89.926 kg)  03/15/14 201 lb 12 oz (91.513 kg)   Body mass index is 29.85 kg/(m^2). Encouraged continued effort at healthy diet and exercise with goal of weight loss.

## 2014-05-04 NOTE — Assessment & Plan Note (Signed)
BP Readings from Last 3 Encounters:  05/04/14 120/78  03/30/14 130/76  03/15/14 112/64   BP well controlled on Metoprolol. Will continue. Follow up in 08/2014 as scheduled.

## 2014-05-04 NOTE — Patient Instructions (Signed)
Continue to monitor blood pressure at home.  Please email or call if blood pressure consistently over 140/90.

## 2014-05-04 NOTE — Progress Notes (Signed)
   Subjective:    Patient ID: Alicia Ramos, female    DOB: May 22, 1951, 63 y.o.   MRN: [id number]  HPI 63YO female presents for follow up.  HTN - BP running mostly 120-130s/60-70s. Tolerating medication well. No further headaches. Feeling well.  Has been exercising by walking and doing yoga. Lost 2lb. Following healthier diet with less sugar recently.  Review of Systems  Constitutional: Negative for fever, chills, appetite change, fatigue and unexpected weight change.  Eyes: Negative for visual disturbance.  Respiratory: Negative for shortness of breath.   Cardiovascular: Negative for chest pain and leg swelling.  Gastrointestinal: Negative for abdominal pain.  Skin: Negative for color change and rash.  Neurological: Negative for headaches.  Hematological: Negative for adenopathy. Does not bruise/bleed easily.  Psychiatric/Behavioral: Negative for dysphoric mood. The patient is not nervous/anxious.        Objective:    BP 120/78  Pulse 76  Temp(Src) 97.5 F (36.4 C) (Oral)  Ht 5\' 8"  (1.727 m)  Wt 196 lb 4 oz (89.018 kg)  BMI 29.85 kg/m2  SpO2 97% Physical Exam  Constitutional: She is oriented to person, place, and time. She appears well-developed and well-nourished. No distress.  HENT:  Head: Normocephalic and atraumatic.  Right Ear: External ear normal.  Left Ear: External ear normal.  Nose: Nose normal.  Mouth/Throat: Oropharynx is clear and moist. No oropharyngeal exudate.  Eyes: Conjunctivae are normal. Pupils are equal, round, and reactive to light. Right eye exhibits no discharge. Left eye exhibits no discharge. No scleral icterus.  Neck: Normal range of motion. Neck supple. No tracheal deviation present. No thyromegaly present.  Cardiovascular: Normal rate, regular rhythm, normal heart sounds and intact distal pulses.  Exam reveals no gallop and no friction rub.   No murmur heard. Pulmonary/Chest: Effort normal and breath sounds normal. No accessory muscle usage.  Not tachypneic. No respiratory distress. She has no decreased breath sounds. She has no wheezes. She has no rhonchi. She has no rales. She exhibits no tenderness.  Musculoskeletal: Normal range of motion. She exhibits no edema and no tenderness.  Lymphadenopathy:    She has no cervical adenopathy.  Neurological: She is alert and oriented to person, place, and time. No cranial nerve deficit. She exhibits normal muscle tone. Coordination normal.  Skin: Skin is warm and dry. No rash noted. She is not diaphoretic. No erythema. No pallor.  Psychiatric: She has a normal mood and affect. Her behavior is normal. Judgment and thought content normal.          Assessment & Plan:   Problem List Items Addressed This Visit     Unprioritized   Hypertension - Primary      BP Readings from Last 3 Encounters:  05/04/14 120/78  03/30/14 130/76  03/15/14 112/64   BP well controlled on Metoprolol. Will continue. Follow up in 08/2014 as scheduled.    Obesity (BMI 30-39.9)      Wt Readings from Last 3 Encounters:  05/04/14 196 lb 4 oz (89.018 kg)  03/30/14 198 lb 4 oz (89.926 kg)  03/15/14 201 lb 12 oz (91.513 kg)   Body mass index is 29.85 kg/(m^2). Encouraged continued effort at healthy diet and exercise with goal of weight loss.        Return if symptoms worsen or fail to improve.

## 2014-05-04 NOTE — Progress Notes (Signed)
Pre visit review using our clinic review tool, if applicable. No additional management support is needed unless otherwise documented below in the visit note. 

## 2014-06-26 ENCOUNTER — Telehealth: Payer: Self-pay

## 2014-06-26 NOTE — Telephone Encounter (Signed)
Lets have her come to the office for a nurse BP recheck as this is higher than previous.

## 2014-06-26 NOTE — Telephone Encounter (Signed)
Left vm for pt to return my call.  

## 2014-06-26 NOTE — Telephone Encounter (Signed)
Please see below phone note  

## 2014-06-26 NOTE — Telephone Encounter (Signed)
The patient called and wanted to report her blood pressure is 155/93   Callback - [phone]

## 2014-06-27 ENCOUNTER — Ambulatory Visit (INDEPENDENT_AMBULATORY_CARE_PROVIDER_SITE_OTHER): Payer: BC Managed Care – PPO | Admitting: *Deleted

## 2014-06-27 VITALS — BP 136/70 | HR 76

## 2014-06-27 DIAGNOSIS — I10 Essential (primary) hypertension: Secondary | ICD-10-CM

## 2014-06-27 NOTE — Progress Notes (Signed)
I would recommend she increase her Metoprolol to 50mg  daily for better BP control. We should recheck BP in a visit in 1-2 weeks.

## 2014-06-27 NOTE — Progress Notes (Signed)
Pt presents for BP check. Brought readings with her, placed in Walker's yellow folder for review. BP here 136/70. Pt states has occasional headaches when BP elevated. Verified taking Metoprolol 25 mg daily. Dr. Gilford Rile notified.

## 2014-06-28 ENCOUNTER — Other Ambulatory Visit: Payer: Self-pay | Admitting: *Deleted

## 2014-06-28 MED ORDER — METOPROLOL SUCCINATE ER 50 MG PO TB24: 50.0000 mg | ORAL_TABLET | Freq: Every day | ORAL | Status: DC

## 2014-07-11 ENCOUNTER — Ambulatory Visit (INDEPENDENT_AMBULATORY_CARE_PROVIDER_SITE_OTHER): Payer: BC Managed Care – PPO | Admitting: Internal Medicine

## 2014-07-11 ENCOUNTER — Encounter: Payer: Self-pay | Admitting: Internal Medicine

## 2014-07-11 VITALS — BP 130/77 | HR 72 | Temp 97.9°F | Ht 68.0 in | Wt 198.0 lb

## 2014-07-11 DIAGNOSIS — I10 Essential (primary) hypertension: Secondary | ICD-10-CM

## 2014-07-11 MED ORDER — METOPROLOL SUCCINATE ER 50 MG PO TB24: 50.0000 mg | ORAL_TABLET | Freq: Every day | ORAL | Status: DC

## 2014-07-11 NOTE — Progress Notes (Signed)
Pre visit review using our clinic review tool, if applicable. No additional management support is needed unless otherwise documented below in the visit note. 

## 2014-07-11 NOTE — Progress Notes (Signed)
   Subjective:    Patient ID: Alicia Ramos, female    DOB: 1950/12/15, 63 y.o.   MRN: [id number]  HPI 63YO female presents for follow up.  HTN - BP at home typically 120-140s/80s. Compliant with medications. No headache, chest pain, palpitations.  Past medical, surgical, family and social history per today's encounter.  Review of Systems  Constitutional: Negative for fever, chills, appetite change, fatigue and unexpected weight change.  Eyes: Negative for visual disturbance.  Respiratory: Negative for shortness of breath.   Cardiovascular: Negative for chest pain, palpitations and leg swelling.  Gastrointestinal: Negative for abdominal pain.  Skin: Negative for color change and rash.  Neurological: Negative for headaches.  Hematological: Negative for adenopathy. Does not bruise/bleed easily.  Psychiatric/Behavioral: Negative for dysphoric mood. The patient is not nervous/anxious.        Objective:    BP 130/77 mmHg  Pulse 72  Temp(Src) 97.9 F (36.6 C) (Oral)  Ht 5\' 8"  (1.727 m)  Wt 198 lb (89.812 kg)  BMI 30.11 kg/m2  SpO2 96% Physical Exam  Constitutional: She is oriented to person, place, and time. She appears well-developed and well-nourished. No distress.  HENT:  Head: Normocephalic and atraumatic.  Right Ear: External ear normal.  Left Ear: External ear normal.  Nose: Nose normal.  Mouth/Throat: Oropharynx is clear and moist. No oropharyngeal exudate.  Eyes: Conjunctivae are normal. Pupils are equal, round, and reactive to light. Right eye exhibits no discharge. Left eye exhibits no discharge. No scleral icterus.  Neck: Normal range of motion. Neck supple. No tracheal deviation present. No thyromegaly present.  Cardiovascular: Normal rate, regular rhythm, normal heart sounds and intact distal pulses.  Exam reveals no gallop and no friction rub.   No murmur heard. Pulmonary/Chest: Effort normal and breath sounds normal. No accessory muscle usage. No tachypnea. No  respiratory distress. She has no decreased breath sounds. She has no wheezes. She has no rhonchi. She has no rales. She exhibits no tenderness.  Musculoskeletal: Normal range of motion. She exhibits no edema or tenderness.  Lymphadenopathy:    She has no cervical adenopathy.  Neurological: She is alert and oriented to person, place, and time. No cranial nerve deficit. She exhibits normal muscle tone. Coordination normal.  Skin: Skin is warm and dry. No rash noted. She is not diaphoretic. No erythema. No pallor.  Psychiatric: She has a normal mood and affect. Her behavior is normal. Judgment and thought content normal.          Assessment & Plan:   Problem List Items Addressed This Visit      Unprioritized   Hypertension - Primary    BP Readings from Last 3 Encounters:  07/11/14 130/77  06/27/14 136/70  05/04/14 120/78   BP well controlled on current medication. Will continue. Follow up in 08/2014 as scheduled for physical    Relevant Medications      metoprolol succinate (TOPROL-XL) 24 hr tablet       No Follow-up on file.

## 2014-07-11 NOTE — Assessment & Plan Note (Signed)
BP Readings from Last 3 Encounters:  07/11/14 130/77  06/27/14 136/70  05/04/14 120/78   BP well controlled on current medication. Will continue. Follow up in 08/2014 as scheduled for physical

## 2014-07-11 NOTE — Patient Instructions (Signed)
Follow up for physical in February.

## 2014-09-18 ENCOUNTER — Encounter: Payer: Self-pay | Admitting: Internal Medicine

## 2014-09-18 ENCOUNTER — Ambulatory Visit (INDEPENDENT_AMBULATORY_CARE_PROVIDER_SITE_OTHER): Payer: 59 | Admitting: Internal Medicine

## 2014-09-18 VITALS — BP 143/82 | HR 81 | Temp 98.2°F | Ht 67.75 in | Wt 201.1 lb

## 2014-09-18 DIAGNOSIS — I10 Essential (primary) hypertension: Secondary | ICD-10-CM

## 2014-09-18 DIAGNOSIS — Z0000 Encounter for general adult medical examination without abnormal findings: Secondary | ICD-10-CM

## 2014-09-18 DIAGNOSIS — B977 Papillomavirus as the cause of diseases classified elsewhere: Secondary | ICD-10-CM

## 2014-09-18 DIAGNOSIS — R8789 Other abnormal findings in specimens from female genital organs: Secondary | ICD-10-CM

## 2014-09-18 DIAGNOSIS — E669 Obesity, unspecified: Secondary | ICD-10-CM | POA: Diagnosis not present

## 2014-09-18 DIAGNOSIS — IMO0002 Reserved for concepts with insufficient information to code with codable children: Secondary | ICD-10-CM

## 2014-09-18 DIAGNOSIS — A630 Anogenital (venereal) warts: Secondary | ICD-10-CM

## 2014-09-18 LAB — MICROALBUMIN / CREATININE URINE RATIO
CREATININE, U: 52.6 mg/dL
MICROALB/CREAT RATIO: 1.3 mg/g (ref 0.0–30.0)

## 2014-09-18 LAB — COMPREHENSIVE METABOLIC PANEL
ALBUMIN: 4.6 g/dL (ref 3.5–5.2)
ALK PHOS: 70 U/L (ref 39–117)
ALT: 20 U/L (ref 0–35)
AST: 18 U/L (ref 0–37)
BILIRUBIN TOTAL: 0.4 mg/dL (ref 0.2–1.2)
BUN: 16 mg/dL (ref 6–23)
CO2: 26 mEq/L (ref 19–32)
Calcium: 10 mg/dL (ref 8.4–10.5)
Chloride: 101 mEq/L (ref 96–112)
Creatinine, Ser: 0.88 mg/dL (ref 0.40–1.20)
GFR: 68.6 mL/min (ref >60.00)
GLUCOSE: 101 mg/dL — AB (ref 70–99)
Potassium: 4.9 mEq/L (ref 3.5–5.1)
Sodium: 139 mEq/L (ref 135–145)
Total Protein: 7.8 g/dL (ref 6.0–8.3)

## 2014-09-18 LAB — CBC WITH DIFFERENTIAL/PLATELET
BASOS ABS: 0 10*3/uL (ref 0.0–0.1)
BASOS PCT: 0.4 % (ref 0.0–3.0)
EOS PCT: 1.3 % (ref 0.0–5.0)
Eosinophils Absolute: 0.1 10*3/uL (ref 0.0–0.7)
HEMATOCRIT: 41.3 % (ref 36.0–46.0)
HEMOGLOBIN: 14.1 g/dL (ref 12.0–15.0)
LYMPHS ABS: 2 10*3/uL (ref 0.7–4.0)
Lymphocytes Relative: 33.7 % (ref 12.0–46.0)
MCHC: 34.1 g/dL (ref 30.0–36.0)
MCV: 89.3 fl (ref 78.0–100.0)
MONO ABS: 0.4 10*3/uL (ref 0.1–1.0)
Monocytes Relative: 6.5 % (ref 3.0–12.0)
NEUTROS ABS: 3.5 10*3/uL (ref 1.4–7.7)
NEUTROS PCT: 58.1 % (ref 43.0–77.0)
Platelets: 248 10*3/uL (ref 150.0–400.0)
RBC: 4.63 Mil/uL (ref 3.87–5.11)
RDW: 13.1 % (ref 11.5–15.5)
WBC: 5.9 10*3/uL (ref 4.0–10.5)

## 2014-09-18 LAB — LIPID PANEL
CHOLESTEROL: 275 mg/dL — AB (ref 0–200)
HDL: 54.2 mg/dL (ref >39.00)
NONHDL: 220.8
TRIGLYCERIDES: 375 mg/dL — AB (ref 0.0–149.0)
Total CHOL/HDL Ratio: 5
VLDL: 75 mg/dL — ABNORMAL HIGH (ref 0.0–40.0)

## 2014-09-18 LAB — LDL CHOLESTEROL, DIRECT: Direct LDL: 173 mg/dL

## 2014-09-18 LAB — VITAMIN D 25 HYDROXY (VIT D DEFICIENCY, FRACTURES): VITD: 32.94 ng/mL (ref 30.00–100.00)

## 2014-09-18 LAB — HEMOGLOBIN A1C: HEMOGLOBIN A1C: 5.9 % (ref 4.6–6.5)

## 2014-09-18 LAB — TSH: TSH: 4.53 u[IU]/mL — ABNORMAL HIGH (ref 0.35–4.50)

## 2014-09-18 NOTE — Assessment & Plan Note (Signed)
She has had genital warts removed several times in the past and would like to establish care with a GYN to complete this process again.

## 2014-09-18 NOTE — Assessment & Plan Note (Signed)
Will request recent PAP report from [facility].

## 2014-09-18 NOTE — Assessment & Plan Note (Signed)
Wt Readings from Last 3 Encounters:  09/18/14 201 lb 2 oz (91.23 kg)  07/11/14 198 lb (89.812 kg)  05/04/14 196 lb 4 oz (89.018 kg)   Body mass index is 30.8 kg/(m^2). The patient is asked to make an attempt to improve diet and exercise patterns to aid in medical management of this problem.

## 2014-09-18 NOTE — Patient Instructions (Signed)

## 2014-09-18 NOTE — Progress Notes (Signed)
Pre visit review using our clinic review tool, if applicable. No additional management support is needed unless otherwise documented below in the visit note. 

## 2014-09-18 NOTE — Progress Notes (Signed)
Subjective:    Patient ID: Alicia Ramos, female    DOB: 06/06/1951, 64 y.o.   MRN: [id number]  HPI  64YO female presents for annual exam.  Due for colonoscopy. Would like to schedule after on Medicare. Due for mammogram. Would like to scheduled. Had PAP smear last year through [facility].  Generally feeling well. Has not been following any diet. Not exercising.   Wt Readings from Last 3 Encounters:  09/18/14 201 lb 2 oz (91.23 kg)  07/11/14 198 lb (89.812 kg)  05/04/14 196 lb 4 oz (89.018 kg)     Past medical, surgical, family and social history per today's encounter.  Review of Systems  Constitutional: Negative for fever, chills, appetite change, fatigue and unexpected weight change.  Eyes: Negative for visual disturbance.  Respiratory: Negative for shortness of breath.   Cardiovascular: Negative for chest pain and leg swelling.  Gastrointestinal: Negative for nausea, vomiting, abdominal pain, diarrhea, constipation and blood in stool.  Musculoskeletal: Negative for myalgias and arthralgias.  Skin: Negative for color change and rash.  Hematological: Negative for adenopathy. Does not bruise/bleed easily.  Psychiatric/Behavioral: Negative for dysphoric mood. The patient is not nervous/anxious.        Objective:    BP 143/82 mmHg  Pulse 81  Temp(Src) 98.2 F (36.8 C) (Oral)  Ht 5' 7.75" (1.721 m)  Wt 201 lb 2 oz (91.23 kg)  BMI 30.80 kg/m2  SpO2 97% Physical Exam  Constitutional: She is oriented to person, place, and time. She appears well-developed and well-nourished. No distress.  HENT:  Head: Normocephalic and atraumatic.  Right Ear: External ear normal.  Left Ear: External ear normal.  Nose: Nose normal.  Mouth/Throat: Oropharynx is clear and moist. No oropharyngeal exudate.  Eyes: Conjunctivae are normal. Pupils are equal, round, and reactive to light. Right eye exhibits no discharge. Left eye exhibits no discharge. No scleral icterus.  Neck: Normal range of  motion. Neck supple. No tracheal deviation present. No thyromegaly present.  Cardiovascular: Normal rate, regular rhythm, normal heart sounds and intact distal pulses.  Exam reveals no gallop and no friction rub.   No murmur heard. Pulmonary/Chest: Effort normal and breath sounds normal. No accessory muscle usage. No tachypnea. No respiratory distress. She has no decreased breath sounds. She has no wheezes. She has no rales. She exhibits no tenderness. Right breast exhibits no inverted nipple, no mass, no nipple discharge, no skin change and no tenderness. Left breast exhibits no inverted nipple, no mass, no nipple discharge, no skin change and no tenderness. Breasts are symmetrical.  Abdominal: Soft. Bowel sounds are normal. She exhibits no distension and no mass. There is no tenderness. There is no rebound and no guarding.  Musculoskeletal: Normal range of motion. She exhibits no edema or tenderness.  Lymphadenopathy:    She has no cervical adenopathy.  Neurological: She is alert and oriented to person, place, and time. No cranial nerve deficit. She exhibits normal muscle tone. Coordination normal.  Skin: Skin is warm and dry. No rash noted. She is not diaphoretic. No erythema. No pallor.  Psychiatric: She has a normal mood and affect. Her behavior is normal. Judgment and thought content normal.          Assessment & Plan:   Problem List Items Addressed This Visit      Unprioritized   Abnormal cervical Pap smear with positive HPV DNA test    Will request recent PAP report from [facility].      Genital warts  She has had genital warts removed several times in the past and would like to establish care with a GYN to complete this process again.      Relevant Orders   Ambulatory referral to Gynecology   Hypertension    BP Readings from Last 3 Encounters:  09/18/14 143/82  07/11/14 130/77  06/27/14 136/70   BP slightly elevated today. Generally, however, has been well controlled. Will  check renal function with labs. Continue Metoprolol.      Obesity (BMI 30-39.9)    Wt Readings from Last 3 Encounters:  09/18/14 201 lb 2 oz (91.23 kg)  07/11/14 198 lb (89.812 kg)  05/04/14 196 lb 4 oz (89.018 kg)   Body mass index is 30.8 kg/(m^2). The patient is asked to make an attempt to improve diet and exercise patterns to aid in medical management of this problem.       Routine general medical examination at a health care facility - Primary    General medical exam including breast exam normal today. Will request PAP results from [facility]. Will request recent mammogram report. Colonoscopy due, and pt prefers to hold off on ordering until she is on Medicare in May. Immunizations are up-to-date.Labs today including CBC, CMP, lipids, TSH, A1c. Encouraged healthy diet and regular exercise. Follow up 6-12 months and prn.        Relevant Orders   MM Digital Screening   TSH   Hemoglobin A1c   CBC with Differential/Platelet   Comprehensive metabolic panel   Lipid panel   Microalbumin / creatinine urine ratio   Vit D  25 hydroxy (rtn osteoporosis monitoring)       Return in about 6 months (around 03/19/2015) for Recheck.

## 2014-09-18 NOTE — Assessment & Plan Note (Signed)
General medical exam including breast exam normal today. Will request PAP results from [facility]. Will request recent mammogram report. Colonoscopy due, and pt prefers to hold off on ordering until she is on Medicare in May. Immunizations are up-to-date.Labs today including CBC, CMP, lipids, TSH, A1c. Encouraged healthy diet and regular exercise. Follow up 6-12 months and prn.

## 2014-09-18 NOTE — Assessment & Plan Note (Signed)
BP Readings from Last 3 Encounters:  09/18/14 143/82  07/11/14 130/77  06/27/14 136/70   BP slightly elevated today. Generally, however, has been well controlled. Will check renal function with labs. Continue Metoprolol.

## 2014-09-20 ENCOUNTER — Other Ambulatory Visit (INDEPENDENT_AMBULATORY_CARE_PROVIDER_SITE_OTHER): Payer: 59

## 2014-09-20 DIAGNOSIS — Z0000 Encounter for general adult medical examination without abnormal findings: Secondary | ICD-10-CM

## 2014-09-20 DIAGNOSIS — I10 Essential (primary) hypertension: Secondary | ICD-10-CM

## 2014-09-20 LAB — T4, FREE: FREE T4: 0.74 ng/dL (ref 0.60–1.60)

## 2014-09-25 ENCOUNTER — Ambulatory Visit: Payer: Self-pay | Admitting: Internal Medicine

## 2014-09-25 LAB — HM MAMMOGRAPHY: HM Mammogram: NEGATIVE

## 2014-09-27 ENCOUNTER — Other Ambulatory Visit: Payer: Self-pay | Admitting: *Deleted

## 2014-09-27 MED ORDER — ATORVASTATIN CALCIUM 10 MG PO TABS: 10.0000 mg | ORAL_TABLET | Freq: Every day | ORAL | Status: DC

## 2014-09-27 NOTE — Telephone Encounter (Signed)
Error

## 2014-10-24 ENCOUNTER — Other Ambulatory Visit (HOSPITAL_COMMUNITY)
Admission: RE | Admit: 2014-10-24 | Discharge: 2014-10-24 | Disposition: A | Discharge status: Home / Self Care | Payer: 59 | Source: Ambulatory Visit | Attending: Internal Medicine | Admitting: Internal Medicine

## 2014-10-24 ENCOUNTER — Ambulatory Visit (INDEPENDENT_AMBULATORY_CARE_PROVIDER_SITE_OTHER): Payer: 59 | Admitting: Internal Medicine

## 2014-10-24 ENCOUNTER — Encounter: Payer: Self-pay | Admitting: Internal Medicine

## 2014-10-24 VITALS — BP 132/82 | HR 71 | Temp 97.7°F | Ht 67.75 in | Wt 201.2 lb

## 2014-10-24 DIAGNOSIS — Z1151 Encounter for screening for human papillomavirus (HPV): Secondary | ICD-10-CM | POA: Diagnosis present

## 2014-10-24 DIAGNOSIS — R8789 Other abnormal findings in specimens from female genital organs: Secondary | ICD-10-CM

## 2014-10-24 DIAGNOSIS — B977 Papillomavirus as the cause of diseases classified elsewhere: Secondary | ICD-10-CM | POA: Diagnosis not present

## 2014-10-24 DIAGNOSIS — N760 Acute vaginitis: Secondary | ICD-10-CM

## 2014-10-24 DIAGNOSIS — Z01419 Encounter for gynecological examination (general) (routine) without abnormal findings: Secondary | ICD-10-CM | POA: Diagnosis present

## 2014-10-24 DIAGNOSIS — IMO0002 Reserved for concepts with insufficient information to code with codable children: Secondary | ICD-10-CM

## 2014-10-24 MED ORDER — FLUCONAZOLE 150 MG PO TABS: 150.0000 mg | ORAL_TABLET | Freq: Once | ORAL | Status: DC

## 2014-10-24 NOTE — Assessment & Plan Note (Addendum)
Repeat PAP sent today. She is s/p hysterectomy, but does have cervix in place.

## 2014-10-24 NOTE — Progress Notes (Signed)
Pre visit review using our clinic review tool, if applicable. No additional management support is needed unless otherwise documented below in the visit note. 

## 2014-10-24 NOTE — Assessment & Plan Note (Signed)
White discharge most c/w candidiasis. Will treat with Diflucan. Follow up prn.

## 2014-10-24 NOTE — Progress Notes (Signed)
   Subjective:    Patient ID: Alicia Ramos, female    DOB: [birth year], 64 y.o.   MRN: [id number]  HPI  64YO female presents for PAP.   Last PAP 06/2013 was normal, HPV neg, however PAP in 2013 was HPV pos.  Feeling well. Notes some white vaginal discharge over last few weeks. Questions whether this may be yeast infection or irritation from toilet paper. No pelvic pain, fever. No urinary symptoms.   Past medical, surgical, family and social history per today's encounter.  Review of Systems  Constitutional: Negative for fever, chills, appetite change, fatigue and unexpected weight change.  Eyes: Negative for visual disturbance.  Respiratory: Negative for shortness of breath.   Cardiovascular: Negative for chest pain and leg swelling.  Gastrointestinal: Negative for nausea, vomiting, abdominal pain, diarrhea and constipation.  Genitourinary: Positive for vaginal discharge. Negative for dysuria, urgency, frequency, vaginal bleeding, vaginal pain and pelvic pain.  Skin: Negative for color change and rash.  Hematological: Negative for adenopathy. Does not bruise/bleed easily.  Psychiatric/Behavioral: Negative for dysphoric mood. The patient is not nervous/anxious.        Objective:    BP 132/82 mmHg  Pulse 71  Temp(Src) 97.7 F (36.5 C) (Oral)  Ht 5' 7.75" (1.721 m)  Wt 201 lb 4 oz (91.286 kg)  BMI 30.82 kg/m2  SpO2 99% Physical Exam  Constitutional: She is oriented to person, place, and time. She appears well-developed and well-nourished. No distress.  HENT:  Head: Normocephalic and atraumatic.  Right Ear: External ear normal.  Left Ear: External ear normal.  Nose: Nose normal.  Mouth/Throat: Oropharynx is clear and moist.  Eyes: Conjunctivae are normal. Pupils are equal, round, and reactive to light. Right eye exhibits no discharge. Left eye exhibits no discharge. No scleral icterus.  Neck: Normal range of motion. Neck supple. No tracheal deviation present. No thyromegaly  present.  Cardiovascular: Normal rate, regular rhythm, normal heart sounds and intact distal pulses.  Exam reveals no gallop and no friction rub.   No murmur heard. Pulmonary/Chest: Effort normal and breath sounds normal. No respiratory distress. She has no wheezes. She has no rales. She exhibits no tenderness.  Genitourinary: Rectum normal. Cervix exhibits discharge (white). Cervix exhibits no motion tenderness and no friability. Right adnexum displays no mass and no fullness. Left adnexum displays no mass, no tenderness and no fullness. No erythema, tenderness or bleeding in the vagina. No signs of injury around the vagina. Vaginal discharge (white) found.  Musculoskeletal: Normal range of motion. She exhibits no edema or tenderness.  Lymphadenopathy:    She has no cervical adenopathy.  Neurological: She is alert and oriented to person, place, and time. No cranial nerve deficit. She exhibits normal muscle tone. Coordination normal.  Skin: Skin is warm and dry. No rash noted. She is not diaphoretic. No erythema. No pallor.  Psychiatric: She has a normal mood and affect. Her behavior is normal. Judgment and thought content normal.          Assessment & Plan:   Problem List Items Addressed This Visit      Unprioritized   Abnormal cervical Pap smear with positive HPV DNA test - Primary    Repeat PAP sent today.       Vaginitis and vulvovaginitis    White discharge most c/w candidiasis. Will treat with Diflucan. Follow up prn.          Return if symptoms worsen or fail to improve.

## 2014-10-24 NOTE — Addendum Note (Signed)
Addended by: Karlene Einstein D on: 10/24/2014 02:36 PM   Modules accepted: Orders

## 2014-10-24 NOTE — Patient Instructions (Signed)
Take Diflucan 150mg  once to treat yeast infection.  Follow up as needed.

## 2014-10-25 LAB — CYTOLOGY - PAP

## 2014-11-29 ENCOUNTER — Other Ambulatory Visit: Payer: Self-pay | Admitting: Internal Medicine

## 2014-12-28 ENCOUNTER — Other Ambulatory Visit (INDEPENDENT_AMBULATORY_CARE_PROVIDER_SITE_OTHER): Payer: PPO

## 2014-12-28 ENCOUNTER — Telehealth: Payer: Self-pay | Admitting: *Deleted

## 2014-12-28 ENCOUNTER — Encounter (INDEPENDENT_AMBULATORY_CARE_PROVIDER_SITE_OTHER): Payer: Self-pay

## 2014-12-28 DIAGNOSIS — E785 Hyperlipidemia, unspecified: Secondary | ICD-10-CM | POA: Diagnosis not present

## 2014-12-28 LAB — COMPREHENSIVE METABOLIC PANEL
ALBUMIN: 4.7 g/dL (ref 3.5–5.2)
ALT: 23 U/L (ref 0–35)
AST: 23 U/L (ref 0–37)
Alkaline Phosphatase: 87 U/L (ref 39–117)
BUN: 14 mg/dL (ref 6–23)
CHLORIDE: 100 meq/L (ref 96–112)
CO2: 31 mEq/L (ref 19–32)
Calcium: 10 mg/dL (ref 8.4–10.5)
Creatinine, Ser: 0.83 mg/dL (ref 0.40–1.20)
GFR: 73.33 mL/min (ref >60.00)
Glucose, Bld: 104 mg/dL — ABNORMAL HIGH (ref 70–99)
Potassium: 5.1 mEq/L (ref 3.5–5.1)
Sodium: 137 mEq/L (ref 135–145)
Total Bilirubin: 0.5 mg/dL (ref 0.2–1.2)
Total Protein: 7.8 g/dL (ref 6.0–8.3)

## 2014-12-28 LAB — LIPID PANEL
CHOL/HDL RATIO: 4
Cholesterol: 211 mg/dL — ABNORMAL HIGH (ref 0–200)
HDL: 56.4 mg/dL (ref >39.00)
NonHDL: 154.6
Triglycerides: 230 mg/dL — ABNORMAL HIGH (ref 0.0–149.0)
VLDL: 46 mg/dL — AB (ref 0.0–40.0)

## 2014-12-28 LAB — LDL CHOLESTEROL, DIRECT: Direct LDL: 119 mg/dL

## 2014-12-28 NOTE — Telephone Encounter (Signed)
CMP and lipids for hyperlipidemia

## 2014-12-28 NOTE — Telephone Encounter (Signed)
Labs and dX?  

## 2015-01-01 ENCOUNTER — Other Ambulatory Visit: Payer: Self-pay | Admitting: *Deleted

## 2015-01-01 MED ORDER — ATORVASTATIN CALCIUM 20 MG PO TABS: 20.0000 mg | ORAL_TABLET | Freq: Every day | ORAL | Status: DC

## 2015-01-17 ENCOUNTER — Telehealth: Payer: Self-pay | Admitting: *Deleted

## 2015-01-17 NOTE — Telephone Encounter (Signed)
OK. Please have her stop the Lipitor. Then, we should make a follow up visit to discuss alternative options. Thanks

## 2015-01-17 NOTE — Telephone Encounter (Signed)
Pt called states the Lipitor is causing joint pain and cramping.  Please advise

## 2015-01-17 NOTE — Telephone Encounter (Signed)
Spoke with pt, advised of MDs message and scheduled f/u appoint.

## 2015-01-22 ENCOUNTER — Ambulatory Visit (INDEPENDENT_AMBULATORY_CARE_PROVIDER_SITE_OTHER): Payer: PPO | Admitting: Internal Medicine

## 2015-01-22 ENCOUNTER — Encounter: Payer: Self-pay | Admitting: Internal Medicine

## 2015-01-22 VITALS — BP 132/78 | HR 88 | Temp 97.7°F | Ht 67.75 in | Wt 207.0 lb

## 2015-01-22 DIAGNOSIS — E785 Hyperlipidemia, unspecified: Secondary | ICD-10-CM | POA: Insufficient documentation

## 2015-01-22 NOTE — Assessment & Plan Note (Signed)
Unable to tolerate Atorvastatin because of severe myalgia. Discussed options of starting Crestor or Zetia as a trial. Also discussed Mediterranean style diet and exercise. Will hold off on medication for now. Repeat lipids in 2 months.

## 2015-01-22 NOTE — Patient Instructions (Addendum)
We will plan to repeat cholesterol levels in August.     Why follow it? Research shows. . Those who follow the Mediterranean diet have a reduced risk of heart disease  . The diet is associated with a reduced incidence of Parkinson's and Alzheimer's diseases . People following the diet may have longer life expectancies and lower rates of chronic diseases  . The Dietary Guidelines for Americans recommends the Mediterranean diet as an eating plan to promote health and prevent disease  What Is the Mediterranean Diet?  . Healthy eating plan based on typical foods and recipes of Mediterranean-style cooking . The diet is primarily a plant based diet; these foods should make up a majority of meals   Starches - Plant based foods should make up a majority of meals - They are an important sources of vitamins, minerals, energy, antioxidants, and fiber - Choose whole grains, foods high in fiber and minimally processed items  - Typical grain sources include wheat, oats, barley, corn, brown rice, bulgar, farro, millet, polenta, couscous  - Various types of beans include chickpeas, lentils, fava beans, black beans, white beans   Fruits  Veggies - Large quantities of antioxidant rich fruits & veggies; 6 or more servings  - Vegetables can be eaten raw or lightly drizzled with oil and cooked  - Vegetables common to the traditional Mediterranean Diet include: artichokes, arugula, beets, broccoli, brussel sprouts, cabbage, carrots, celery, collard greens, cucumbers, eggplant, kale, leeks, lemons, lettuce, mushrooms, okra, onions, peas, peppers, potatoes, pumpkin, radishes, rutabaga, shallots, spinach, sweet potatoes, turnips, zucchini - Fruits common to the Mediterranean Diet include: apples, apricots, avocados, cherries, clementines, dates, figs, grapefruits, grapes, melons, nectarines, oranges, peaches, pears, pomegranates, strawberries, tangerines  Fats - Replace butter and margarine with healthy oils, such as  olive oil, canola oil, and tahini  - Limit nuts to no more than a handful a day  - Nuts include walnuts, almonds, pecans, pistachios, pine nuts  - Limit or avoid candied, honey roasted or heavily salted nuts - Olives are central to the [employer] - can be eaten whole or used in a variety of dishes   Meats Protein - Limiting red meat: no more than a few times a month - When eating red meat: choose lean cuts and keep the portion to the size of deck of cards - Eggs: approx. 0 to 4 times a week  - Fish and lean poultry: at least 2 a week  - Healthy protein sources include, chicken, [place], lean beef, lamb - Increase intake of seafood such as tuna, salmon, trout, mackerel, shrimp, scallops - Avoid or limit high fat processed meats such as sausage and bacon  Dairy - Include moderate amounts of low fat dairy products  - Focus on healthy dairy such as fat free yogurt, skim milk, low or reduced fat cheese - Limit dairy products higher in fat such as whole or 2% milk, cheese, ice cream  Alcohol - Moderate amounts of red wine is ok  - No more than 5 oz daily for women (all ages) and men older than age 45  - No more than 10 oz of wine daily for men younger than 22  Other - Limit sweets and other desserts  - Use herbs and spices instead of salt to flavor foods  - Herbs and spices common to the traditional Mediterranean Diet include: basil, bay leaves, chives, cloves, cumin, fennel, garlic, lavender, marjoram, mint, oregano, parsley, pepper, rosemary, sage, savory, sumac, tarragon, thyme   It's  not just a diet, it's a lifestyle:  . The Mediterranean diet includes lifestyle factors typical of those in the region  . Foods, drinks and meals are best eaten with others and savored . Daily physical activity is important for overall good health . This could be strenuous exercise like running and aerobics . This could also be more leisurely activities such as walking, housework, yard-work, or taking the  stairs . Moderation is the key; a balanced and healthy diet accommodates most foods and drinks . Consider portion sizes and frequency of consumption of certain foods   Meal Ideas & Options:  . Breakfast:  o Whole wheat toast or whole wheat English muffins with peanut butter & hard boiled egg o Steel cut oats topped with apples & cinnamon and skim milk  o Fresh fruit: banana, strawberries, melon, berries, peaches  o Smoothies: strawberries, bananas, greek yogurt, peanut butter o Low fat greek yogurt with blueberries and granola  o Egg white omelet with spinach and mushrooms o Breakfast couscous: whole wheat couscous, apricots, skim milk, cranberries  . Sandwiches:  o Hummus and grilled vegetables (peppers, zucchini, squash) on whole wheat bread   o Grilled chicken on whole wheat pita with lettuce, tomatoes, cucumbers or tzatziki  o Tuna salad on whole wheat bread: tuna salad made with greek yogurt, olives, red peppers, capers, green onions o Garlic rosemary lamb pita: lamb sauted with garlic, rosemary, salt & pepper; add lettuce, cucumber, greek yogurt to pita - flavor with lemon juice and black pepper  . Seafood:  o Mediterranean grilled salmon, seasoned with garlic, basil, parsley, lemon juice and black pepper o Shrimp, lemon, and spinach whole-grain pasta salad made with low fat greek yogurt  o Seared scallops with lemon orzo  o Seared tuna steaks seasoned salt, pepper, coriander topped with tomato mixture of olives, tomatoes, olive oil, minced garlic, parsley, green onions and cappers  . Meats:  o Herbed greek chicken salad with kalamata olives, cucumber, feta  o Red bell peppers stuffed with spinach, bulgur, lean ground beef (or lentils) & topped with feta   o Kebabs: skewers of chicken, tomatoes, onions, zucchini, squash  o [place] burgers: made with red onions, mint, dill, lemon juice, feta cheese topped with roasted red peppers . Vegetarian o Cucumber salad: cucumbers, artichoke  hearts, celery, red onion, feta cheese, tossed in olive oil & lemon juice  o Hummus and whole grain pita points with a greek salad (lettuce, tomato, feta, olives, cucumbers, red onion) o Lentil soup with celery, carrots made with vegetable broth, garlic, salt and pepper  o Tabouli salad: parsley, bulgur, mint, scallions, cucumbers, tomato, radishes, lemon juice, olive oil, salt and pepper.

## 2015-01-22 NOTE — Progress Notes (Signed)
Pre visit review using our clinic review tool, if applicable. No additional management support is needed unless otherwise documented below in the visit note. 

## 2015-01-22 NOTE — Progress Notes (Signed)
   Subjective:    Patient ID: Alicia Ramos, female    DOB: 1950-08-26, 64 y.o.   MRN: [id number]  HPI  64YO female presents for follow up.  Recently stopped Atorvastatin because of joint pain and muscle cramping. Symptoms described as severe with difficulty walking because of pain. Symptoms have resolved after stopping the medication.  Last LDL was 119 in 12/2014. LDL was 175 in 08/2014 prior to starting Lipitor.  Trying to follow a healthy diet, limiting dairy. Trying to exercise on a regular basis.  Wt Readings from Last 3 Encounters:  01/22/15 207 lb (93.895 kg)  10/24/14 201 lb 4 oz (91.286 kg)  09/18/14 201 lb 2 oz (91.23 kg)      Past medical, surgical, family and social history per today's encounter.  Review of Systems  Constitutional: Negative for fever, chills, appetite change, fatigue and unexpected weight change.  Eyes: Negative for visual disturbance.  Respiratory: Negative for shortness of breath.   Cardiovascular: Negative for chest pain and leg swelling.  Gastrointestinal: Negative for abdominal pain.  Musculoskeletal: Positive for myalgias and arthralgias.  Skin: Negative for color change and rash.  Hematological: Negative for adenopathy. Does not bruise/bleed easily.  Psychiatric/Behavioral: Negative for dysphoric mood. The patient is not nervous/anxious.        Objective:    BP 132/78 mmHg  Pulse 88  Temp(Src) 97.7 F (36.5 C) (Oral)  Ht 5' 7.75" (1.721 m)  Wt 207 lb (93.895 kg)  BMI 31.70 kg/m2  SpO2 97% Physical Exam  Constitutional: She is oriented to person, place, and time. She appears well-developed and well-nourished. No distress.  HENT:  Head: Normocephalic and atraumatic.  Right Ear: External ear normal.  Left Ear: External ear normal.  Nose: Nose normal.  Mouth/Throat: Oropharynx is clear and moist. No oropharyngeal exudate.  Eyes: Conjunctivae are normal. Pupils are equal, round, and reactive to light. Right eye exhibits no  discharge. Left eye exhibits no discharge. No scleral icterus.  Neck: Normal range of motion. Neck supple. No tracheal deviation present. No thyromegaly present.  Cardiovascular: Normal rate, regular rhythm, normal heart sounds and intact distal pulses.  Exam reveals no gallop and no friction rub.   No murmur heard. Pulmonary/Chest: Effort normal and breath sounds normal. No respiratory distress. She has no wheezes. She has no rales. She exhibits no tenderness.  Musculoskeletal: Normal range of motion. She exhibits no edema or tenderness.  Lymphadenopathy:    She has no cervical adenopathy.  Neurological: She is alert and oriented to person, place, and time. No cranial nerve deficit. She exhibits normal muscle tone. Coordination normal.  Skin: Skin is warm and dry. No rash noted. She is not diaphoretic. No erythema. No pallor.  Psychiatric: She has a normal mood and affect. Her behavior is normal. Judgment and thought content normal.          Assessment & Plan:   Problem List Items Addressed This Visit      Unprioritized   Hyperlipemia - Primary    Unable to tolerate Atorvastatin because of severe myalgia. Discussed options of starting Crestor or Zetia as a trial. Also discussed Mediterranean style diet and exercise. Will hold off on medication for now. Repeat lipids in 2 months.           Return if symptoms worsen or fail to improve.

## 2015-03-28 ENCOUNTER — Ambulatory Visit (INDEPENDENT_AMBULATORY_CARE_PROVIDER_SITE_OTHER): Payer: PPO | Admitting: Internal Medicine

## 2015-03-28 ENCOUNTER — Encounter: Payer: Self-pay | Admitting: Internal Medicine

## 2015-03-28 VITALS — BP 135/79 | HR 100 | Temp 97.8°F | Ht 67.75 in | Wt 205.4 lb

## 2015-03-28 DIAGNOSIS — I10 Essential (primary) hypertension: Secondary | ICD-10-CM

## 2015-03-28 DIAGNOSIS — E785 Hyperlipidemia, unspecified: Secondary | ICD-10-CM | POA: Diagnosis not present

## 2015-03-28 DIAGNOSIS — Z23 Encounter for immunization: Secondary | ICD-10-CM

## 2015-03-28 DIAGNOSIS — E669 Obesity, unspecified: Secondary | ICD-10-CM

## 2015-03-28 MED ORDER — METOPROLOL SUCCINATE ER 50 MG PO TB24: 50.0000 mg | ORAL_TABLET | Freq: Every day | ORAL | Status: DC

## 2015-03-28 NOTE — Progress Notes (Signed)
Subjective:    Patient ID: Alicia Ramos, female    DOB: [birth year], 64 y.o.   MRN: [id number]  HPI  64YO female presents for follow up.  Feeling well. No concerns today. Was not able to tolerate Lipitor because of severe muscle pain. Stopped the medication. Not interested in trying an alternative statin at this time. Following a Mediterranean style diet and exercising with yoga and walking.   Wt Readings from Last 3 Encounters:  03/28/15 205 lb 6 oz (93.157 kg)  01/22/15 207 lb (93.895 kg)  10/24/14 201 lb 4 oz (91.286 kg)   BP Readings from Last 3 Encounters:  03/28/15 135/79  01/22/15 132/78  10/24/14 132/82     Past Medical History  Diagnosis Date  . HTN (hypertension)   . Hyperlipidemia     Tried statin but had severe myalgia  . Esophageal ulcer 2000    Required blood transfusion  . Shingles 2001    Left chest  . Vaginal delivery     x1   Family History  Problem Relation Age of Onset  . Hypertension Mother   . Lung cancer Father   . Hypertension Sister    Past Surgical History  Procedure Laterality Date  . Abdominal hysterectomy     Social History   Social History  . Marital Status: Divorced    Spouse Name: N/A  . Number of Children: N/A  . Years of Education: N/A   Social History Main Topics  . Smoking status: Former Smoker    Quit date: 08/01/2003  . Smokeless tobacco: None  . Alcohol Use: Yes     Comment: Occasional  . Drug Use: None  . Sexual Activity: Not Currently   Other Topics Concern  . None   Social History Narrative   Regular Exercise -  Yes   Lives in [place] w/son          Review of Systems  Constitutional: Negative for fever, chills, appetite change, fatigue and unexpected weight change.  Eyes: Negative for visual disturbance.  Respiratory: Negative for shortness of breath.   Cardiovascular: Negative for chest pain and leg swelling.  Gastrointestinal: Negative for nausea, vomiting, abdominal pain, diarrhea and  constipation.  Musculoskeletal: Negative for myalgias and arthralgias.  Skin: Negative for color change and rash.  Neurological: Negative for weakness.  Hematological: Negative for adenopathy. Does not bruise/bleed easily.  Psychiatric/Behavioral: Negative for sleep disturbance and dysphoric mood. The patient is not nervous/anxious.        Objective:    BP 135/79 mmHg  Pulse 100  Temp(Src) 97.8 F (36.6 C) (Oral)  Ht 5' 7.75" (1.721 m)  Wt 205 lb 6 oz (93.157 kg)  BMI 31.45 kg/m2  SpO2 96% Physical Exam  Constitutional: She is oriented to person, place, and time. She appears well-developed and well-nourished. No distress.  HENT:  Head: Normocephalic and atraumatic.  Right Ear: External ear normal.  Left Ear: External ear normal.  Nose: Nose normal.  Mouth/Throat: Oropharynx is clear and moist. No oropharyngeal exudate.  Eyes: Conjunctivae are normal. Pupils are equal, round, and reactive to light. Right eye exhibits no discharge. Left eye exhibits no discharge. No scleral icterus.  Neck: Normal range of motion. Neck supple. No tracheal deviation present. No thyromegaly present.  Cardiovascular: Normal rate, regular rhythm, normal heart sounds and intact distal pulses.  Exam reveals no gallop and no friction rub.   No murmur heard. Pulmonary/Chest: Effort normal and breath sounds normal. No respiratory distress. She has no  wheezes. She has no rales. She exhibits no tenderness.  Musculoskeletal: Normal range of motion. She exhibits no edema or tenderness.  Lymphadenopathy:    She has no cervical adenopathy.  Neurological: She is alert and oriented to person, place, and time. No cranial nerve deficit. She exhibits normal muscle tone. Coordination normal.  Skin: Skin is warm and dry. No rash noted. She is not diaphoretic. No erythema. No pallor.  Psychiatric: She has a normal mood and affect. Her behavior is normal. Judgment and thought content normal.          Assessment &  Plan:  Over 79min of which >50% spent in face-to-face contact with patient discussing plan of care  Problem List Items Addressed This Visit      Unprioritized   Hyperlipemia    Intolerant of Atorvastatin because of myalgia. Discussed potential change to Crestor, however she would like to hold off for now. Will continue Mediterranean style diet and exercise. Repeat lipids in 08/2015.      Relevant Medications   metoprolol succinate (TOPROL-XL) 50 MG 24 hr tablet   Hypertension - Primary    BP Readings from Last 3 Encounters:  03/28/15 135/79  01/22/15 132/78  10/24/14 132/82   BP well controlled on Metoprolol. Recent renal function normal. Follow up in 6 months.      Relevant Medications   metoprolol succinate (TOPROL-XL) 50 MG 24 hr tablet   Obesity (BMI 30-39.9)    Wt Readings from Last 3 Encounters:  03/28/15 205 lb 6 oz (93.157 kg)  01/22/15 207 lb (93.895 kg)  10/24/14 201 lb 4 oz (91.286 kg)   Body mass index is 31.45 kg/(m^2). Encouraged healthy diet and exercise.          Return in about 6 months (around 09/25/2015) for Physical.

## 2015-03-28 NOTE — Assessment & Plan Note (Signed)
Wt Readings from Last 3 Encounters:  03/28/15 205 lb 6 oz (93.157 kg)  01/22/15 207 lb (93.895 kg)  10/24/14 201 lb 4 oz (91.286 kg)   Body mass index is 31.45 kg/(m^2). Encouraged healthy diet and exercise.

## 2015-03-28 NOTE — Addendum Note (Signed)
Addended by: Vernetta Honey on: 03/28/2015 09:54 AM   Modules accepted: Orders

## 2015-03-28 NOTE — Patient Instructions (Signed)
Continue working on [name] and exercise.  Repeat labs next spring.

## 2015-03-28 NOTE — Assessment & Plan Note (Signed)
BP Readings from Last 3 Encounters:  03/28/15 135/79  01/22/15 132/78  10/24/14 132/82   BP well controlled on Metoprolol. Recent renal function normal. Follow up in 6 months.

## 2015-03-28 NOTE — Progress Notes (Signed)
Pre visit review using our clinic review tool, if applicable. No additional management support is needed unless otherwise documented below in the visit note. 

## 2015-03-28 NOTE — Assessment & Plan Note (Signed)
Intolerant of Atorvastatin because of myalgia. Discussed potential change to Crestor, however she would like to hold off for now. Will continue Mediterranean style diet and exercise. Repeat lipids in 08/2015.

## 2015-06-26 ENCOUNTER — Telehealth: Payer: Self-pay | Admitting: Internal Medicine

## 2015-06-26 NOTE — Telephone Encounter (Signed)
Left msg to call office to schedule AWV/msn °

## 2015-08-16 ENCOUNTER — Telehealth: Payer: Self-pay | Admitting: Internal Medicine

## 2015-08-16 NOTE — Telephone Encounter (Signed)
Left msg to call office to schedule AWV/msn °

## 2015-08-27 ENCOUNTER — Telehealth: Payer: Self-pay | Admitting: Internal Medicine

## 2015-08-27 NOTE — Telephone Encounter (Signed)
Left msg to call office to reschedule AWV. appt needs to be Dr. Tonna Boehringer

## 2015-08-30 ENCOUNTER — Ambulatory Visit: Payer: PPO

## 2015-09-27 ENCOUNTER — Ambulatory Visit (INDEPENDENT_AMBULATORY_CARE_PROVIDER_SITE_OTHER): Payer: PPO | Admitting: Internal Medicine

## 2015-09-27 ENCOUNTER — Ambulatory Visit: Payer: PPO

## 2015-09-27 ENCOUNTER — Encounter: Payer: Self-pay | Admitting: Internal Medicine

## 2015-09-27 VITALS — BP 138/88 | HR 80 | Temp 98.0°F | Ht 67.5 in | Wt 205.0 lb

## 2015-09-27 DIAGNOSIS — Z0000 Encounter for general adult medical examination without abnormal findings: Secondary | ICD-10-CM | POA: Diagnosis not present

## 2015-09-27 DIAGNOSIS — Z1239 Encounter for other screening for malignant neoplasm of breast: Secondary | ICD-10-CM

## 2015-09-27 DIAGNOSIS — R7989 Other specified abnormal findings of blood chemistry: Secondary | ICD-10-CM

## 2015-09-27 DIAGNOSIS — Z1159 Encounter for screening for other viral diseases: Secondary | ICD-10-CM

## 2015-09-27 DIAGNOSIS — E669 Obesity, unspecified: Secondary | ICD-10-CM | POA: Diagnosis not present

## 2015-09-27 LAB — CBC WITH DIFFERENTIAL/PLATELET
BASOS PCT: 0.7 % (ref 0.0–3.0)
Basophils Absolute: 0 10*3/uL (ref 0.0–0.1)
EOS ABS: 0.1 10*3/uL (ref 0.0–0.7)
Eosinophils Relative: 1.4 % (ref 0.0–5.0)
HCT: 40.1 % (ref 36.0–46.0)
HEMOGLOBIN: 13.6 g/dL (ref 12.0–15.0)
LYMPHS PCT: 39.9 % (ref 12.0–46.0)
Lymphs Abs: 1.9 10*3/uL (ref 0.7–4.0)
MCHC: 33.8 g/dL (ref 30.0–36.0)
MCV: 89.7 fl (ref 78.0–100.0)
Monocytes Absolute: 0.4 10*3/uL (ref 0.1–1.0)
Monocytes Relative: 7.3 % (ref 3.0–12.0)
Neutro Abs: 2.4 10*3/uL (ref 1.4–7.7)
Neutrophils Relative %: 50.7 % (ref 43.0–77.0)
Platelets: 272 10*3/uL (ref 150.0–400.0)
RBC: 4.47 Mil/uL (ref 3.87–5.11)
RDW: 13.4 % (ref 11.5–15.5)
WBC: 4.8 10*3/uL (ref 4.0–10.5)

## 2015-09-27 LAB — LIPID PANEL
CHOL/HDL RATIO: 5
CHOLESTEROL: 263 mg/dL — AB (ref 0–200)
HDL: 50.3 mg/dL (ref >39.00)
NONHDL: 213
TRIGLYCERIDES: 210 mg/dL — AB (ref 0.0–149.0)
VLDL: 42 mg/dL — AB (ref 0.0–40.0)

## 2015-09-27 LAB — COMPREHENSIVE METABOLIC PANEL
ALBUMIN: 4.5 g/dL (ref 3.5–5.2)
ALT: 19 U/L (ref 0–35)
AST: 21 U/L (ref 0–37)
Alkaline Phosphatase: 59 U/L (ref 39–117)
BUN: 17 mg/dL (ref 6–23)
CO2: 28 meq/L (ref 19–32)
CREATININE: 0.8 mg/dL (ref 0.40–1.20)
Calcium: 9.9 mg/dL (ref 8.4–10.5)
Chloride: 103 mEq/L (ref 96–112)
GFR: 76.33 mL/min (ref >60.00)
Glucose, Bld: 107 mg/dL — ABNORMAL HIGH (ref 70–99)
Potassium: 3.9 mEq/L (ref 3.5–5.1)
Sodium: 138 mEq/L (ref 135–145)
Total Bilirubin: 0.4 mg/dL (ref 0.2–1.2)
Total Protein: 7.4 g/dL (ref 6.0–8.3)

## 2015-09-27 LAB — HEMOGLOBIN A1C: Hgb A1c MFr Bld: 5.9 % (ref 4.6–6.5)

## 2015-09-27 LAB — HEPATITIS C ANTIBODY: HCV AB: NEGATIVE

## 2015-09-27 LAB — LDL CHOLESTEROL, DIRECT: Direct LDL: 184 mg/dL

## 2015-09-27 LAB — TSH: TSH: 2.82 u[IU]/mL (ref 0.35–4.50)

## 2015-09-27 NOTE — Progress Notes (Signed)
Pre visit review using our clinic review tool, if applicable. No additional management support is needed unless otherwise documented below in the visit note. 

## 2015-09-27 NOTE — Addendum Note (Signed)
Addended by: Ronette Deter A on: 09/27/2015 08:32 AM   Modules accepted: Orders

## 2015-09-27 NOTE — Assessment & Plan Note (Addendum)
General medical exam including breast exam normal today. PAP and pelvic deferred as PAP normal, HPV neg in 2016. Mammogram ordered. Labs ordered. Encouraged healthy diet and exercise. Encouraged her to set up HCPOA and living will, gave paperwork on this. Follow up in 6 months and prn. Declines flu vaccine. Plan for Pneumovax in 02/2016

## 2015-09-27 NOTE — Patient Instructions (Signed)
Health Maintenance, Female Adopting a healthy lifestyle and getting preventive care can go a long way to promote health and wellness. Talk with your health care provider about what schedule of regular examinations is right for you. This is a good chance for you to check in with your provider about disease prevention and staying healthy. In between checkups, there are plenty of things you can do on your own. Experts have done a lot of research about which lifestyle changes and preventive measures are most likely to keep you healthy. Ask your health care provider for more information. WEIGHT AND DIET  Eat a healthy diet  Be sure to include plenty of vegetables, fruits, low-fat dairy products, and lean protein.  Do not eat a lot of foods high in solid fats, added sugars, or salt.  Get regular exercise. This is one of the most important things you can do for your health.  Most adults should exercise for at least 150 minutes each week. The exercise should increase your heart rate and make you sweat (moderate-intensity exercise).  Most adults should also do strengthening exercises at least twice a week. This is in addition to the moderate-intensity exercise.  Maintain a healthy weight  Body mass index (BMI) is a measurement that can be used to identify possible weight problems. It estimates body fat based on height and weight. Your health care provider can help determine your BMI and help you achieve or maintain a healthy weight.  For females 20 years of age and older:   A BMI below 18.5 is considered underweight.  A BMI of 18.5 to 24.9 is normal.  A BMI of 25 to 29.9 is considered overweight.  A BMI of 30 and above is considered obese.  Watch levels of cholesterol and blood lipids  You should start having your blood tested for lipids and cholesterol at 66 years of age, then have this test every 5 years.  You may need to have your cholesterol levels checked more often if:  Your lipid  or cholesterol levels are high.  You are older than 66 years of age.  You are at high risk for heart disease.  CANCER SCREENING   Lung Cancer  Lung cancer screening is recommended for adults 55-80 years old who are at high risk for lung cancer because of a history of smoking.  A yearly low-dose CT scan of the lungs is recommended for people who:  Currently smoke.  Have quit within the past 15 years.  Have at least a 30-pack-year history of smoking. A pack year is smoking an average of one pack of cigarettes a day for 1 year.  Yearly screening should continue until it has been 15 years since you quit.  Yearly screening should stop if you develop a health problem that would prevent you from having lung cancer treatment.  Breast Cancer  Practice breast self-awareness. This means understanding how your breasts normally appear and feel.  It also means doing regular breast self-exams. Let your health care provider know about any changes, no matter how small.  If you are in your 20s or 30s, you should have a clinical breast exam (CBE) by a health care provider every 1-3 years as part of a regular health exam.  If you are 40 or older, have a CBE every year. Also consider having a breast X-ray (mammogram) every year.  If you have a family history of breast cancer, talk to your health care provider about genetic screening.  If you   are at high risk for breast cancer, talk to your health care provider about having an MRI and a mammogram every year.  Breast cancer gene (BRCA) assessment is recommended for women who have family members with BRCA-related cancers. BRCA-related cancers include:  Breast.  Ovarian.  Tubal.  Peritoneal cancers.  Results of the assessment will determine the need for genetic counseling and BRCA1 and BRCA2 testing. Cervical Cancer Your health care provider may recommend that you be screened regularly for cancer of the pelvic organs (ovaries, uterus, and  vagina). This screening involves a pelvic examination, including checking for microscopic changes to the surface of your cervix (Pap test). You may be encouraged to have this screening done every 3 years, beginning at age 21.  For women ages 30-65, health care providers may recommend pelvic exams and Pap testing every 3 years, or they may recommend the Pap and pelvic exam, combined with testing for human papilloma virus (HPV), every 5 years. Some types of HPV increase your risk of cervical cancer. Testing for HPV may also be done on women of any age with unclear Pap test results.  Other health care providers may not recommend any screening for nonpregnant women who are considered low risk for pelvic cancer and who do not have symptoms. Ask your health care provider if a screening pelvic exam is right for you.  If you have had past treatment for cervical cancer or a condition that could lead to cancer, you need Pap tests and screening for cancer for at least 20 years after your treatment. If Pap tests have been discontinued, your risk factors (such as having a new sexual partner) need to be reassessed to determine if screening should resume. Some women have medical problems that increase the chance of getting cervical cancer. In these cases, your health care provider may recommend more frequent screening and Pap tests. Colorectal Cancer  This type of cancer can be detected and often prevented.  Routine colorectal cancer screening usually begins at 66 years of age and continues through 66 years of age.  Your health care provider may recommend screening at an earlier age if you have risk factors for colon cancer.  Your health care provider may also recommend using home test kits to check for hidden blood in the stool.  A small camera at the end of a tube can be used to examine your colon directly (sigmoidoscopy or colonoscopy). This is done to check for the earliest forms of colorectal  cancer.  Routine screening usually begins at age 50.  Direct examination of the colon should be repeated every 5-10 years through 66 years of age. However, you may need to be screened more often if early forms of precancerous polyps or small growths are found. Skin Cancer  Check your skin from head to toe regularly.  Tell your health care provider about any new moles or changes in moles, especially if there is a change in a mole's shape or color.  Also tell your health care provider if you have a mole that is larger than the size of a pencil eraser.  Always use sunscreen. Apply sunscreen liberally and repeatedly throughout the day.  Protect yourself by wearing long sleeves, pants, a wide-brimmed hat, and sunglasses whenever you are outside. HEART DISEASE, DIABETES, AND HIGH BLOOD PRESSURE   High blood pressure causes heart disease and increases the risk of stroke. High blood pressure is more likely to develop in:  People who have blood pressure in the high end   of the normal range (130-139/85-89 mm Hg).  People who are overweight or obese.  People who are African American.  If you are 38-23 years of age, have your blood pressure checked every 3-5 years. If you are 61 years of age or older, have your blood pressure checked every year. You should have your blood pressure measured twice--once when you are at a hospital or clinic, and once when you are not at a hospital or clinic. Record the average of the two measurements. To check your blood pressure when you are not at a hospital or clinic, you can use:  An automated blood pressure machine at a pharmacy.  A home blood pressure monitor.  If you are between 45 years and 39 years old, ask your health care provider if you should take aspirin to prevent strokes.  Have regular diabetes screenings. This involves taking a blood sample to check your fasting blood sugar level.  If you are at a normal weight and have a low risk for diabetes,  have this test once every three years after 66 years of age.  If you are overweight and have a high risk for diabetes, consider being tested at a younger age or more often. PREVENTING INFECTION  Hepatitis B  If you have a higher risk for hepatitis B, you should be screened for this virus. You are considered at high risk for hepatitis B if:  You were born in a country where hepatitis B is common. Ask your health care provider which countries are considered high risk.  Your parents were born in a high-risk country, and you have not been immunized against hepatitis B (hepatitis B vaccine).  You have HIV or AIDS.  You use needles to inject street drugs.  You live with someone who has hepatitis B.  You have had sex with someone who has hepatitis B.  You get hemodialysis treatment.  You take certain medicines for conditions, including cancer, organ transplantation, and autoimmune conditions. Hepatitis C  Blood testing is recommended for:  Everyone born from [birth year] through 1966.  Anyone with known risk factors for hepatitis C. Sexually transmitted infections (STIs)  You should be screened for sexually transmitted infections (STIs) including gonorrhea and chlamydia if:  You are sexually active and are younger than 66 years of age.  You are older than 66 years of age and your health care provider tells you that you are at risk for this type of infection.  Your sexual activity has changed since you were last screened and you are at an increased risk for chlamydia or gonorrhea. Ask your health care provider if you are at risk.  If you do not have HIV, but are at risk, it may be recommended that you take a prescription medicine daily to prevent HIV infection. This is called pre-exposure prophylaxis (PrEP). You are considered at risk if:  You are sexually active and do not regularly use condoms or know the HIV status of your partner(s).  You take drugs by injection.  You are sexually  active with a partner who has HIV. Talk with your health care provider about whether you are at high risk of being infected with HIV. If you choose to begin PrEP, you should first be tested for HIV. You should then be tested every 3 months for as long as you are taking PrEP.  PREGNANCY   If you are premenopausal and you may become pregnant, ask your health care provider about preconception counseling.  If you may  become pregnant, take 400 to 800 micrograms (mcg) of folic acid every day.  If you want to prevent pregnancy, talk to your health care provider about birth control (contraception). OSTEOPOROSIS AND MENOPAUSE   Osteoporosis is a disease in which the bones lose minerals and strength with aging. This can result in serious bone fractures. Your risk for osteoporosis can be identified using a bone density scan.  If you are 61 years of age or older, or if you are at risk for osteoporosis and fractures, ask your health care provider if you should be screened.  Ask your health care provider whether you should take a calcium or vitamin D supplement to lower your risk for osteoporosis.  Menopause may have certain physical symptoms and risks.  Hormone replacement therapy may reduce some of these symptoms and risks. Talk to your health care provider about whether hormone replacement therapy is right for you.  HOME CARE INSTRUCTIONS   Schedule regular health, dental, and eye exams.  Stay current with your immunizations.   Do not use any tobacco products including cigarettes, chewing tobacco, or electronic cigarettes.  If you are pregnant, do not drink alcohol.  If you are breastfeeding, limit how much and how often you drink alcohol.  Limit alcohol intake to no more than 1 drink per day for nonpregnant women. One drink equals 12 ounces of beer, 5 ounces of wine, or 1 ounces of hard liquor.  Do not use street drugs.  Do not share needles.  Ask your health care provider for help if  you need support or information about quitting drugs.  Tell your health care provider if you often feel depressed.  Tell your health care provider if you have ever been abused or do not feel safe at home.   This information is not intended to replace advice given to you by your health care provider. Make sure you discuss any questions you have with your health care provider.   Document Released: 01/27/2011 Document Revised: 08/04/2014 Document Reviewed: 06/15/2013 Elsevier Interactive Patient Education [employer].

## 2015-09-27 NOTE — Progress Notes (Deleted)
Patient ID: Alicia Ramos, female   DOB: August 18, 1950, 65 y.o.   MRN: [id number]

## 2015-09-27 NOTE — Progress Notes (Signed)
The patient is here for annual Medicare Wellness Examination and management of other chronic and acute problems.   The risk factors are reflected in the history.  The roster of all physicians providing medical care to patient - is listed in the Snapshot section of the chart.  Activities of daily living:   The patient is 100% independent in all ADLs: dressing, toileting, feeding as well as independent mobility. Patient lives in a home with her son, 66YO. One story home with hard wood floors.  Home safety :  The patient has smoke detectors in the home.  They wear seatbelts in their car. There are no firearms at home.  There is no violence in the home. They feel safe where they live.  Infectious Risks: There is no risks for hepatitis, STDs or HIV.  There is a history of blood transfusion in 2000 for bleeding ulcer. Had 6 units of PRBC. They have no travel history to infectious disease endemic areas of the world.  Additional Health Care Providers: The patient has seen their dentist in the last six months. Dentist - Dr. Carman Ching They have seen their eye doctor in the last year. Opthalmologist - [facility] They deny hearing issues. They have deferred audiologic testing in the last year.   They do not  have excessive sun exposure. Discussed the need for sun protection: hats,long sleeves and use of sunscreen if there is significant sun exposure.  Dermatologist - Dr. Evorn Gong  Diet: the importance of a healthy diet is discussed. They do have a healthy diet.  The benefits of regular aerobic exercise were discussed. Patient exercises with aquatic aerobics and yoga twice weekly. Walks 3 x per week for 1 hr.  Depression screen: there are no signs or vegative symptoms of depression- irritability, change in appetite, anhedonia, sadness/tearfullness.  Cognitive assessment: the patient manages all their financial and personal affairs and is actively engaged. They could relate day,date,year and  events.  HCPOA - considering this Living Will - considering this  The following portions of the patient's history were reviewed and updated as appropriate: allergies, current medications, past family history, past medical history,  past surgical history, past social history and problem list.  Visual acuity was not assessed per patient preference as they have regular follow up with their ophthalmologist. Hearing and body mass index were assessed and reviewed.   During the course of the visit the patient was educated and counseled about appropriate screening and preventive services including : fall prevention , diabetes screening, nutrition counseling, colorectal cancer screening, and recommended immunizations.    Past Medical History  Diagnosis Date  . HTN (hypertension)   . Hyperlipidemia     Tried statin but had severe myalgia  . Esophageal ulcer 2000    Required blood transfusion  . Shingles 2001    Left chest  . Vaginal delivery     x1   Family History  Problem Relation Age of Onset  . Hypertension Mother   . Lung cancer Father   . Hypertension Sister    Social History   Social History  . Marital Status: Divorced    Spouse Name: N/A  . Number of Children: N/A  . Years of Education: N/A   Social History Main Topics  . Smoking status: Former Smoker    Quit date: 08/01/2003  . Smokeless tobacco: None  . Alcohol Use: Yes     Comment: Occasional  . Drug Use: None  . Sexual Activity: Not Currently   Other  Topics Concern  . None   Social History Narrative   Regular Exercise -  Yes   Lives in [place] w/son         Past Surgical History  Procedure Laterality Date  . Abdominal hysterectomy      Review of Systems  Constitutional: Negative for fever, chills, appetite change, fatigue and unexpected weight change.  Eyes: Negative for visual disturbance.  Respiratory: Negative for shortness of breath.   Cardiovascular: Negative for chest pain and leg swelling.   Gastrointestinal: Negative for nausea, vomiting, abdominal pain, diarrhea and constipation.  Musculoskeletal: Negative for myalgias and arthralgias.  Skin: Negative for color change and rash.  Hematological: Negative for adenopathy. Does not bruise/bleed easily.  Psychiatric/Behavioral: Negative for sleep disturbance and dysphoric mood. The patient is not nervous/anxious.        Objective:    BP 138/88 mmHg  Pulse 80  Temp(Src) 98 F (36.7 C) (Oral)  Ht 5' 7.5" (1.715 m)  Wt 205 lb (92.987 kg)  BMI 31.62 kg/m2  SpO2 97% Physical Exam  Constitutional: She is oriented to person, place, and time. She appears well-developed and well-nourished. No distress.  HENT:  Head: Normocephalic and atraumatic.  Right Ear: External ear normal.  Left Ear: External ear normal.  Nose: Nose normal.  Mouth/Throat: Oropharynx is clear and moist. No oropharyngeal exudate.  Eyes: Conjunctivae are normal. Pupils are equal, round, and reactive to light. Right eye exhibits no discharge. Left eye exhibits no discharge. No scleral icterus.  Neck: Normal range of motion. Neck supple. No tracheal deviation present. No thyromegaly present.  Cardiovascular: Normal rate, regular rhythm, normal heart sounds and intact distal pulses.  Exam reveals no gallop and no friction rub.   No murmur heard. Pulmonary/Chest: Effort normal and breath sounds normal. No accessory muscle usage. No tachypnea. No respiratory distress. She has no decreased breath sounds. She has no wheezes. She has no rales. She exhibits no tenderness. Right breast exhibits no inverted nipple, no mass, no nipple discharge, no skin change and no tenderness. Left breast exhibits no inverted nipple, no mass, no nipple discharge, no skin change and no tenderness. Breasts are symmetrical.  Abdominal: Soft. Bowel sounds are normal. She exhibits no distension and no mass. There is no tenderness. There is no rebound and no guarding.  Musculoskeletal: Normal  range of motion. She exhibits no edema or tenderness.  Lymphadenopathy:    She has no cervical adenopathy.  Neurological: She is alert and oriented to person, place, and time. No cranial nerve deficit. She exhibits normal muscle tone. Coordination normal.  Skin: Skin is warm and dry. No rash noted. She is not diaphoretic. No erythema. No pallor.  Psychiatric: She has a normal mood and affect. Her behavior is normal. Judgment and thought content normal.          Assessment & Plan:   Problem List Items Addressed This Visit      Unprioritized   Medicare annual wellness visit, initial - Primary    General medical exam including breast exam normal today. PAP and pelvic deferred as PAP normal, HPV neg in 2016. Mammogram ordered. Labs ordered. Encouraged healthy diet and exercise. Encouraged her to set up HCPOA and living will, gave paperwork on this. Follow up in 6 months and prn. Declines flu vaccine. Plan for Pneumovax in 02/2016      Relevant Orders   CBC with Differential/Platelet   Comprehensive metabolic panel   Lipid panel   Obesity (BMI 30-39.9)   Relevant  Orders   TSH   Hemoglobin A1c    Other Visit Diagnoses    Screening for breast cancer        Relevant Orders    MM Digital Screening        Return in about 6 months (around 03/29/2016) for Recheck.

## 2015-10-03 ENCOUNTER — Ambulatory Visit
Admission: RE | Admit: 2015-10-03 | Discharge: 2015-10-03 | Disposition: A | Discharge status: Home / Self Care | Payer: PPO | Source: Ambulatory Visit | Attending: Internal Medicine | Admitting: Internal Medicine

## 2015-10-03 DIAGNOSIS — Z1239 Encounter for other screening for malignant neoplasm of breast: Secondary | ICD-10-CM

## 2015-10-03 DIAGNOSIS — Z1231 Encounter for screening mammogram for malignant neoplasm of breast: Secondary | ICD-10-CM | POA: Insufficient documentation

## 2016-01-02 DIAGNOSIS — L308 Other specified dermatitis: Secondary | ICD-10-CM | POA: Diagnosis not present

## 2016-01-02 DIAGNOSIS — D2271 Melanocytic nevi of right lower limb, including hip: Secondary | ICD-10-CM | POA: Diagnosis not present

## 2016-01-02 DIAGNOSIS — D225 Melanocytic nevi of trunk: Secondary | ICD-10-CM | POA: Diagnosis not present

## 2016-01-02 DIAGNOSIS — L820 Inflamed seborrheic keratosis: Secondary | ICD-10-CM | POA: Diagnosis not present

## 2016-01-02 DIAGNOSIS — D2262 Melanocytic nevi of left upper limb, including shoulder: Secondary | ICD-10-CM | POA: Diagnosis not present

## 2016-01-02 DIAGNOSIS — D485 Neoplasm of uncertain behavior of skin: Secondary | ICD-10-CM | POA: Diagnosis not present

## 2016-04-01 ENCOUNTER — Ambulatory Visit: Payer: PPO | Admitting: Internal Medicine

## 2016-05-12 ENCOUNTER — Encounter: Payer: Self-pay | Admitting: Family Medicine

## 2016-05-12 ENCOUNTER — Ambulatory Visit (INDEPENDENT_AMBULATORY_CARE_PROVIDER_SITE_OTHER): Payer: PPO | Admitting: Family Medicine

## 2016-05-12 VITALS — BP 148/88 | HR 86 | Temp 97.4°F | Wt 209.5 lb

## 2016-05-12 DIAGNOSIS — E785 Hyperlipidemia, unspecified: Secondary | ICD-10-CM

## 2016-05-12 DIAGNOSIS — I10 Essential (primary) hypertension: Secondary | ICD-10-CM | POA: Diagnosis not present

## 2016-05-12 DIAGNOSIS — Z130 Encounter for screening for diseases of the blood and blood-forming organs and certain disorders involving the immune mechanism: Secondary | ICD-10-CM

## 2016-05-12 LAB — COMPREHENSIVE METABOLIC PANEL
ALT: 19 U/L (ref 0–35)
AST: 17 U/L (ref 0–37)
Albumin: 4.9 g/dL (ref 3.5–5.2)
Alkaline Phosphatase: 65 U/L (ref 39–117)
BILIRUBIN TOTAL: 0.3 mg/dL (ref 0.2–1.2)
BUN: 18 mg/dL (ref 6–23)
CHLORIDE: 102 meq/L (ref 96–112)
CO2: 27 meq/L (ref 19–32)
CREATININE: 0.9 mg/dL (ref 0.40–1.20)
Calcium: 9.7 mg/dL (ref 8.4–10.5)
GFR: 66.5 mL/min (ref >60.00)
Glucose, Bld: 114 mg/dL — ABNORMAL HIGH (ref 70–99)
Potassium: 4.5 mEq/L (ref 3.5–5.1)
SODIUM: 138 meq/L (ref 135–145)
Total Protein: 7.8 g/dL (ref 6.0–8.3)

## 2016-05-12 MED ORDER — METOPROLOL SUCCINATE ER 50 MG PO TB24: 50.0000 mg | ORAL_TABLET | Freq: Every day | ORAL | 3 refills | Status: DC

## 2016-05-12 MED ORDER — EZETIMIBE 10 MG PO TABS: 10.0000 mg | ORAL_TABLET | Freq: Every day | ORAL | 3 refills | Status: DC

## 2016-05-12 NOTE — Assessment & Plan Note (Signed)
Stable. Continue metoprolol. Refilled today.

## 2016-05-12 NOTE — Assessment & Plan Note (Signed)
Uncontrolled/worsening. Starting Zetia. Rx sent.

## 2016-05-12 NOTE — Progress Notes (Signed)
Subjective:  Patient ID: Alicia Ramos, female    DOB: 1951/01/03  Age: 65 y.o. MRN: [id number]  CC: Follow up  HPI:  65 year old female with hypertension and hyperlipidemia presents for follow-up.  HTN  Stable on Metoprolol.  In need of refill.  HLD  Uncontrolled and worsening based on prior labs.  She has not tolerated statins in the past.  She states she is taking fish oil.  Cholesterol markedly elevated.  Will discuss other treatment options today.  Social Hx   Social History   Social History  . Marital status: Divorced    Spouse name: N/A  . Number of children: N/A  . Years of education: N/A   Social History Main Topics  . Smoking status: Former Smoker    Quit date: 08/01/2003  . Smokeless tobacco: None  . Alcohol use Yes     Comment: Occasional  . Drug use: Unknown  . Sexual activity: Not Currently   Other Topics Concern  . None   Social History Narrative   Regular Exercise -  Yes   Lives in [place] w/son         Review of Systems  Constitutional: Negative.   Respiratory: Negative.   Cardiovascular: Negative.    Objective:  BP (!) 148/88 (BP Location: Right Arm, Patient Position: Sitting, Cuff Size: Normal)   Pulse 86   Temp 97.4 F (36.3 C) (Oral)   Wt 209 lb 8 oz (95 kg)   SpO2 96%   BMI 32.33 kg/m   BP/Weight 05/12/2016 09/27/2015 11/03/8117  Systolic BP 147 829 562  Diastolic BP 88 88 79  Wt. (Lbs) 209.5 205 205.38  BMI 32.33 31.62 31.45   Physical Exam  Constitutional: She is oriented to person, place, and time. She appears well-developed. No distress.  Cardiovascular: Normal rate and regular rhythm.   2/6 systolic murmur.  Pulmonary/Chest: Effort normal. She has no wheezes. She has no rales.  Abdominal: Soft. She exhibits no distension. There is no tenderness. There is no rebound and no guarding.  Neurological: She is alert and oriented to person, place, and time.  Psychiatric: She has a normal mood and affect.  Vitals  reviewed.  Lab Results  Component Value Date   WBC 4.8 09/27/2015   HGB 13.6 09/27/2015   HCT 40.1 09/27/2015   PLT 272.0 09/27/2015   GLUCOSE 107 (H) 09/27/2015   CHOL 263 (H) 09/27/2015   TRIG 210.0 (H) 09/27/2015   HDL 50.30 09/27/2015   LDLDIRECT 184.0 09/27/2015   ALT 19 09/27/2015   AST 21 09/27/2015   NA 138 09/27/2015   K 3.9 09/27/2015   CL 103 09/27/2015   CREATININE 0.80 09/27/2015   BUN 17 09/27/2015   CO2 28 09/27/2015   TSH 2.82 09/27/2015   HGBA1C 5.9 09/27/2015   MICROALBUR <0.7 09/18/2014    Assessment & Plan:   Problem List Items Addressed This Visit    Hypertension - Primary    Stable. Continue metoprolol. Refilled today.      Relevant Medications   ezetimibe (ZETIA) 10 MG tablet   metoprolol succinate (TOPROL-XL) 50 MG 24 hr tablet   Other Relevant Orders   Comp Met (CMET)   Hyperlipemia    Uncontrolled/worsening. Starting Zetia. Rx sent.      Relevant Medications   ezetimibe (ZETIA) 10 MG tablet   metoprolol succinate (TOPROL-XL) 50 MG 24 hr tablet    Other Visit Diagnoses   None.    Meds ordered this encounter  Medications  .  ezetimibe (ZETIA) 10 MG tablet    Sig: Take 1 tablet (10 mg total) by mouth daily.    Dispense:  90 tablet    Refill:  3  . metoprolol succinate (TOPROL-XL) 50 MG 24 hr tablet    Sig: Take 1 tablet (50 mg total) by mouth daily. Take with or immediately following a meal.    Dispense:  90 tablet    Refill:  3    Follow-up: Next year (2018).  [place]

## 2016-05-12 NOTE — Patient Instructions (Signed)
Continue the metoprolol. I refilled this today.  Try the Zetia (let me know if its too expensive).  Follow up next year.  Take care  Dr. Lacinda Axon

## 2016-06-30 ENCOUNTER — Ambulatory Visit (INDEPENDENT_AMBULATORY_CARE_PROVIDER_SITE_OTHER): Payer: PPO | Admitting: Family

## 2016-06-30 ENCOUNTER — Encounter: Payer: Self-pay | Admitting: Family

## 2016-06-30 VITALS — BP 140/88 | HR 86 | Temp 98.2°F | Resp 16 | Wt 211.2 lb

## 2016-06-30 DIAGNOSIS — J209 Acute bronchitis, unspecified: Secondary | ICD-10-CM | POA: Diagnosis not present

## 2016-06-30 MED ORDER — PROMETHAZINE-DM 6.25-15 MG/5ML PO SYRP: 5.0000 mL | ORAL_SOLUTION | Freq: Every evening | ORAL | 1 refills | Status: DC | PRN

## 2016-06-30 MED ORDER — AZITHROMYCIN 250 MG PO TABS: ORAL_TABLET | ORAL | 0 refills | Status: DC

## 2016-06-30 NOTE — Patient Instructions (Signed)
Please take cough medication at night only as needed. As we discussed, I do not recommend dosing throughout the day as coughing is a protective mechanism . It also helps to break up thick mucous.  Do not take cough suppressants with alcohol as can lead to trouble breathing. Advise caution if taking cough suppressant and operating machinery ( i.e driving a car) as you may feel very tired.    Increase intake of clear fluids. Congestion is best treated by hydration, when mucus is wetter, it is thinner, less sticky, and easier to expel from the body, either through coughing up drainage, or by blowing your nose.   Get plenty of rest.   Use saline nasal drops and blow your nose frequently. Run a humidifier at night and elevate the head of the bed. Vicks Vapor rub will help with congestion and cough. Steam showers and sinus massage for congestion.   Use Acetaminophen or Ibuprofen as needed for fever or pain. Avoid second hand smoke. Even the smallest exposure will worsen symptoms.   Over the counter medications you can try include Delsym for cough, a decongestant for congestion, and Mucinex or Robitussin as an expectorant. Be sure to just get the plain Mucinex or Robitussin that just has one medication (Guaifenesen). We don't recommend the combination products. Note, be sure to drink two glasses of water with each dose of Mucinex as the medication will not work well without adequate hydration.   You can also try a teaspoon of honey to see if this will help reduce cough. Throat lozenges can sometimes be beneficial as well.    This illness will typically last 7 - 10 days.   Please follow up with our clinic if you develop a fever greater than 101 F, symptoms worsen, or do not resolve in the next week.    

## 2016-06-30 NOTE — Progress Notes (Signed)
Pre visit review using our clinic review tool, if applicable. No additional management support is needed unless otherwise documented below in the visit note. 

## 2016-06-30 NOTE — Progress Notes (Signed)
Subjective:    Patient ID: Alicia Ramos, female    DOB: May 21, 1951, 65 y.o.   MRN: [id number]  CC: Alicia Ramos is a 65 y.o. female who presents today for an acute visit.    HPI: CC: cough, congestion x 2 weeks, worsening. Endorses sneezing, ear pressure, sore throat. No fever, chills, wheezing, sob. H/o PNA years ago. Has been taking ASA for HA ( which has 'eased off'). No other medications.      HISTORY:  Past Medical History:  Diagnosis Date  . Esophageal ulcer 2000   Required blood transfusion  . HTN (hypertension)   . Hyperlipidemia    Tried statin but had severe myalgia  . Shingles 2001   Left chest  . Vaginal delivery    x1   Past Surgical History:  Procedure Laterality Date  . ABDOMINAL HYSTERECTOMY     Family History  Problem Relation Age of Onset  . Hypertension Mother   . Lung cancer Father   . Hypertension Sister   . Breast cancer Neg Hx     Allergies: Ace inhibitors; Losartan; and Statins Current Outpatient Prescriptions on File Prior to Visit  Medication Sig Dispense Refill  . cholecalciferol (VITAMIN D) 1000 UNITS tablet Take 2,000 Units by mouth daily.    Marland Kitchen ezetimibe (ZETIA) 10 MG tablet Take 1 tablet (10 mg total) by mouth daily. 90 tablet 3  . metoprolol succinate (TOPROL-XL) 50 MG 24 hr tablet Take 1 tablet (50 mg total) by mouth daily. Take with or immediately following a meal. 90 tablet 3  . Multiple Vitamins-Minerals (MULTIVITAMIN WITH MINERALS) tablet Take 1 tablet by mouth daily.    . Omega-3 Fatty Acids (FISH OIL) 1000 MG CAPS Take by mouth.     No current facility-administered medications on file prior to visit.     Social History  Substance Use Topics  . Smoking status: Former Smoker    Quit date: 08/01/2003  . Smokeless tobacco: Not on file  . Alcohol use Yes     Comment: Occasional    Review of Systems  Constitutional: Negative for chills and fever.  HENT: Positive for congestion, ear pain, sinus pressure, sneezing, sore  throat and voice change. Negative for ear discharge.   Respiratory: Positive for cough. Negative for shortness of breath and wheezing.   Cardiovascular: Negative for chest pain and palpitations.  Gastrointestinal: Negative for nausea and vomiting.      Objective:    BP 140/88 (BP Location: Left Arm, Patient Position: Sitting, Cuff Size: Normal)   Pulse 86   Temp 98.2 F (36.8 C) (Oral)   Resp 16   Wt 211 lb 4 oz (95.8 kg)   SpO2 98%   BMI 32.60 kg/m    Physical Exam  Constitutional: She appears well-developed and well-nourished.  HENT:  Head: Normocephalic and atraumatic.  Right Ear: Hearing, tympanic membrane, external ear and ear canal normal. No drainage, swelling or tenderness. No foreign bodies. Tympanic membrane is not erythematous and not bulging. No middle ear effusion. No decreased hearing is noted.  Left Ear: Hearing, tympanic membrane, external ear and ear canal normal. No drainage, swelling or tenderness. No foreign bodies. Tympanic membrane is not erythematous and not bulging.  No middle ear effusion. No decreased hearing is noted.  Nose: Nose normal. No rhinorrhea. Right sinus exhibits no maxillary sinus tenderness and no frontal sinus tenderness. Left sinus exhibits no maxillary sinus tenderness and no frontal sinus tenderness.  Mouth/Throat: Uvula is midline, oropharynx is clear  and moist and mucous membranes are normal. No oropharyngeal exudate, posterior oropharyngeal edema, posterior oropharyngeal erythema or tonsillar abscesses.  Eyes: Conjunctivae are normal.  Cardiovascular: Regular rhythm, normal heart sounds and normal pulses.   Pulmonary/Chest: Effort normal and breath sounds normal. She has no wheezes. She has no rhonchi. She has no rales.  Lymphadenopathy:       Head (right side): No submental, no submandibular, no tonsillar, no preauricular, no posterior auricular and no occipital adenopathy present.       Head (left side): No submental, no submandibular,  no tonsillar, no preauricular, no posterior auricular and no occipital adenopathy present.    She has no cervical adenopathy.  Neurological: She is alert.  Skin: Skin is warm and dry.  Psychiatric: She has a normal mood and affect. Her speech is normal and behavior is normal. Thought content normal.  Vitals reviewed.      Assessment & Plan:   1. Acute bronchitis, unspecified organism Based on duration of symptoms, patient and I agreed to treat with antibiotic. Return precautions given. - azithromycin (ZITHROMAX) 250 MG tablet; Take 2 tablets ( total 500 mg) PO on day 1, then take 1 tablet ( total 250 mg) by mouth q24h x 4 days.  Dispense: 6 tablet; Refill: 0 - promethazine-dextromethorphan (PROMETHAZINE-DM) 6.25-15 MG/5ML syrup; Take 5 mLs by mouth at bedtime as needed for cough.  Dispense: 40 mL; Refill: 1    I am having Ms. Schoch start on azithromycin and promethazine-dextromethorphan. I am also having her maintain her cholecalciferol, multivitamin with minerals, Fish Oil, ezetimibe, and metoprolol succinate.   Meds ordered this encounter  Medications  . azithromycin (ZITHROMAX) 250 MG tablet    Sig: Take 2 tablets ( total 500 mg) PO on day 1, then take 1 tablet ( total 250 mg) by mouth q24h x 4 days.    Dispense:  6 tablet    Refill:  0    Order Specific Question:   Supervising Provider    Answer:   Derrel Nip, TERESA L [[id number]]  . promethazine-dextromethorphan (PROMETHAZINE-DM) 6.25-15 MG/5ML syrup    Sig: Take 5 mLs by mouth at bedtime as needed for cough.    Dispense:  40 mL    Refill:  1    Order Specific Question:   Supervising Provider    Answer:   Crecencio Mc [[id number]]    Return precautions given.   Risks, benefits, and alternatives of the medications and treatment plan prescribed today were discussed, and patient expressed understanding.   Education regarding symptom management and diagnosis given to patient on AVS.  Continue to follow with [name], FNP for  routine health maintenance.   Alicia Ramos and I agreed with plan.   [name], FNP

## 2016-07-28 DIAGNOSIS — C4490 Unspecified malignant neoplasm of skin, unspecified: Secondary | ICD-10-CM

## 2016-07-28 HISTORY — DX: Unspecified malignant neoplasm of skin, unspecified: C44.90

## 2016-08-21 ENCOUNTER — Other Ambulatory Visit: Payer: Self-pay | Admitting: Family

## 2016-08-21 DIAGNOSIS — Z1231 Encounter for screening mammogram for malignant neoplasm of breast: Secondary | ICD-10-CM

## 2016-10-07 ENCOUNTER — Ambulatory Visit
Admission: RE | Admit: 2016-10-07 | Discharge: 2016-10-07 | Disposition: A | Discharge status: Home / Self Care | Payer: PPO | Source: Ambulatory Visit | Attending: Family | Admitting: Family

## 2016-10-07 DIAGNOSIS — Z1231 Encounter for screening mammogram for malignant neoplasm of breast: Secondary | ICD-10-CM

## 2016-11-11 NOTE — Progress Notes (Signed)
Subjective:    Patient ID: Alicia Ramos, female    DOB: March 31, 1951, 66 y.o.   MRN: [id number]  CC: Alicia Ramos is a 66 y.o. female who presents today for follow up.   HPI: HTN- Compliant with medication. Averages at home 130/85. Denies exertional chest pain or pressure, numbness or tingling radiating to left arm or jaw, palpitations, dizziness, frequent headaches, changes in vision, or shortness of breath.    HLD- not on anything until speaks with insurance in terms what is covered for statins.     Will return for cpe; would like cpe labs today.        HISTORY:  Past Medical History:  Diagnosis Date  . Esophageal ulcer 2000   Required blood transfusion  . HTN (hypertension)   . Hyperlipidemia    Tried statin but had severe myalgia  . Shingles 2001   Left chest  . Vaginal delivery    x1   Past Surgical History:  Procedure Laterality Date  . ABDOMINAL HYSTERECTOMY     Family History  Problem Relation Age of Onset  . Hypertension Mother   . Lung cancer Father   . Hypertension Sister   . Breast cancer Paternal Aunt     Allergies: Ace inhibitors; Losartan; and Statins Current Outpatient Prescriptions on File Prior to Visit  Medication Sig Dispense Refill  . azithromycin (ZITHROMAX) 250 MG tablet Take 2 tablets ( total 500 mg) PO on day 1, then take 1 tablet ( total 250 mg) by mouth q24h x 4 days. 6 tablet 0  . cholecalciferol (VITAMIN D) 1000 UNITS tablet Take 2,000 Units by mouth daily.    . metoprolol succinate (TOPROL-XL) 50 MG 24 hr tablet Take 1 tablet (50 mg total) by mouth daily. Take with or immediately following a meal. 90 tablet 3  . Multiple Vitamins-Minerals (MULTIVITAMIN WITH MINERALS) tablet Take 1 tablet by mouth daily.    . Omega-3 Fatty Acids (FISH OIL) 1000 MG CAPS Take by mouth.    . promethazine-dextromethorphan (PROMETHAZINE-DM) 6.25-15 MG/5ML syrup Take 5 mLs by mouth at bedtime as needed for cough. 40 mL 1   No current  facility-administered medications on file prior to visit.     Social History  Substance Use Topics  . Smoking status: Former Smoker    Quit date: 08/01/2003  . Smokeless tobacco: Never Used  . Alcohol use Yes     Comment: Occasional    Review of Systems  Constitutional: Negative for chills and fever.  Eyes: Negative for visual disturbance.  Respiratory: Negative for cough.   Cardiovascular: Negative for chest pain and palpitations.  Gastrointestinal: Negative for nausea and vomiting.  Neurological: Negative for headaches.      Objective:    BP (!) 138/96   Pulse 85   Temp 98.1 F (36.7 C) (Oral)   Ht 5' 7.75" (1.721 m)   Wt 207 lb (93.9 kg)   SpO2 95%   BMI 31.71 kg/m  BP Readings from Last 3 Encounters:  11/12/16 (!) 138/96  06/30/16 140/88  05/12/16 (!) 148/88   Wt Readings from Last 3 Encounters:  11/12/16 207 lb (93.9 kg)  06/30/16 211 lb 4 oz (95.8 kg)  05/12/16 209 lb 8 oz (95 kg)    Physical Exam  Constitutional: She appears well-developed and well-nourished.  Eyes: Conjunctivae are normal.  Cardiovascular: Normal rate, regular rhythm, normal heart sounds and normal pulses.   Pulmonary/Chest: Effort normal and breath sounds normal. She has no wheezes. She  has no rhonchi. She has no rales.  Neurological: She is alert.  Skin: Skin is warm and dry.  Psychiatric: She has a normal mood and affect. Her speech is normal and behavior is normal. Thought content normal.  Vitals reviewed.      Assessment & Plan:   Problem List Items Addressed This Visit      Cardiovascular and Mediastinum   Hypertension - Primary    DBP increased. Trial start amlodipine.       Relevant Medications   amLODipine (NORVASC) 2.5 MG tablet   Other Relevant Orders   CBC with Differential/Platelet   Comprehensive metabolic panel   Hemoglobin A1c   Lipid panel   TSH   VITAMIN D 25 Hydroxy (Vit-D Deficiency, Fractures)     Other   Hyperlipemia    Awaiting insurance list of  medications.       Relevant Medications   amLODipine (NORVASC) 2.5 MG tablet       I have discontinued Ms. Cartwright's ezetimibe. I am also having her start on amLODipine. Additionally, I am having her maintain her cholecalciferol, multivitamin with minerals, Fish Oil, metoprolol succinate, azithromycin, and promethazine-dextromethorphan.   Meds ordered this encounter  Medications  . amLODipine (NORVASC) 2.5 MG tablet    Sig: Take 1 tablet (2.5 mg total) by mouth daily.    Dispense:  90 tablet    Refill:  3    Order Specific Question:   Supervising Provider    Answer:   Crecencio Mc [[id number]]    Return precautions given.   Risks, benefits, and alternatives of the medications and treatment plan prescribed today were discussed, and patient expressed understanding.   Education regarding symptom management and diagnosis given to patient on AVS.  Continue to follow with [name], FNP for routine health maintenance.   Alicia Ramos and I agreed with plan.   [name], FNP

## 2016-11-12 ENCOUNTER — Ambulatory Visit (INDEPENDENT_AMBULATORY_CARE_PROVIDER_SITE_OTHER): Payer: PPO | Admitting: Family

## 2016-11-12 ENCOUNTER — Encounter: Payer: Self-pay | Admitting: Family

## 2016-11-12 VITALS — BP 138/96 | HR 85 | Temp 98.1°F | Ht 67.75 in | Wt 207.0 lb

## 2016-11-12 DIAGNOSIS — E785 Hyperlipidemia, unspecified: Secondary | ICD-10-CM | POA: Diagnosis not present

## 2016-11-12 DIAGNOSIS — I10 Essential (primary) hypertension: Secondary | ICD-10-CM

## 2016-11-12 LAB — CBC WITH DIFFERENTIAL/PLATELET
BASOS PCT: 0.8 % (ref 0.0–3.0)
Basophils Absolute: 0 10*3/uL (ref 0.0–0.1)
EOS PCT: 1.1 % (ref 0.0–5.0)
Eosinophils Absolute: 0.1 10*3/uL (ref 0.0–0.7)
HEMATOCRIT: 43.4 % (ref 36.0–46.0)
Hemoglobin: 14.4 g/dL (ref 12.0–15.0)
LYMPHS ABS: 2.1 10*3/uL (ref 0.7–4.0)
Lymphocytes Relative: 35.9 % (ref 12.0–46.0)
MCHC: 33.2 g/dL (ref 30.0–36.0)
MCV: 92.1 fl (ref 78.0–100.0)
MONOS PCT: 6.7 % (ref 3.0–12.0)
Monocytes Absolute: 0.4 10*3/uL (ref 0.1–1.0)
NEUTROS ABS: 3.3 10*3/uL (ref 1.4–7.7)
NEUTROS PCT: 55.5 % (ref 43.0–77.0)
Platelets: 286 10*3/uL (ref 150.0–400.0)
RBC: 4.71 Mil/uL (ref 3.87–5.11)
RDW: 13.5 % (ref 11.5–15.5)
WBC: 5.9 10*3/uL (ref 4.0–10.5)

## 2016-11-12 LAB — COMPREHENSIVE METABOLIC PANEL
ALT: 22 U/L (ref 0–35)
AST: 21 U/L (ref 0–37)
Albumin: 4.9 g/dL (ref 3.5–5.2)
Alkaline Phosphatase: 73 U/L (ref 39–117)
BUN: 11 mg/dL (ref 6–23)
CALCIUM: 10.4 mg/dL (ref 8.4–10.5)
CO2: 28 mEq/L (ref 19–32)
Chloride: 101 mEq/L (ref 96–112)
Creatinine, Ser: 0.89 mg/dL (ref 0.40–1.20)
GFR: 67.26 mL/min (ref >60.00)
GLUCOSE: 106 mg/dL — AB (ref 70–99)
POTASSIUM: 5 meq/L (ref 3.5–5.1)
Sodium: 137 mEq/L (ref 135–145)
Total Bilirubin: 0.4 mg/dL (ref 0.2–1.2)
Total Protein: 8.1 g/dL (ref 6.0–8.3)

## 2016-11-12 LAB — LIPID PANEL
CHOLESTEROL: 280 mg/dL — AB (ref 0–200)
HDL: 54.8 mg/dL (ref >39.00)
NONHDL: 225.43
TRIGLYCERIDES: 387 mg/dL — AB (ref 0.0–149.0)
Total CHOL/HDL Ratio: 5
VLDL: 77.4 mg/dL — ABNORMAL HIGH (ref 0.0–40.0)

## 2016-11-12 LAB — TSH: TSH: 4.2 u[IU]/mL (ref 0.35–4.50)

## 2016-11-12 LAB — HEMOGLOBIN A1C: HEMOGLOBIN A1C: 6.1 % (ref 4.6–6.5)

## 2016-11-12 LAB — LDL CHOLESTEROL, DIRECT: LDL DIRECT: 174 mg/dL

## 2016-11-12 LAB — VITAMIN D 25 HYDROXY (VIT D DEFICIENCY, FRACTURES): VITD: 45.93 ng/mL (ref 30.00–100.00)

## 2016-11-12 MED ORDER — AMLODIPINE BESYLATE 2.5 MG PO TABS: 2.5000 mg | ORAL_TABLET | Freq: Every day | ORAL | 3 refills | Status: DC

## 2016-11-12 NOTE — Patient Instructions (Addendum)
Let me know result of colonoscopy so we can decide cologuard versus colonoscopy  Labs today  Pleasure seeing you  Please make physical appointment  Monitor blood pressure on new medication- goal < 140/80  Low salt diet

## 2016-11-12 NOTE — Progress Notes (Signed)
Pre visit review using our clinic review tool, if applicable. No additional management support is needed unless otherwise documented below in the visit note. 

## 2016-11-12 NOTE — Assessment & Plan Note (Signed)
DBP increased. Trial start amlodipine.

## 2016-11-12 NOTE — Progress Notes (Signed)
Patient stated she will call her insurance to see if Cologuard test is covered.  Patient would rather do this type of colon cancer screening.

## 2016-11-12 NOTE — Assessment & Plan Note (Signed)
Awaiting insurance list of medications.

## 2016-11-13 ENCOUNTER — Other Ambulatory Visit: Payer: Self-pay | Admitting: Family

## 2016-11-17 ENCOUNTER — Telehealth: Payer: Self-pay | Admitting: *Deleted

## 2016-11-17 DIAGNOSIS — E785 Hyperlipidemia, unspecified: Secondary | ICD-10-CM

## 2016-11-17 MED ORDER — PRAVASTATIN SODIUM 40 MG PO TABS: 40.0000 mg | ORAL_TABLET | Freq: Every day | ORAL | 2 refills | Status: DC

## 2016-11-17 NOTE — Telephone Encounter (Signed)
Call pt rx sent 

## 2016-11-17 NOTE — Telephone Encounter (Signed)
Please advise 

## 2016-11-17 NOTE — Telephone Encounter (Signed)
Patient was to receive a cholesterol medication , however the pharmacy has not received the Rx.  [facility]  Pt contact [phone]

## 2016-11-17 NOTE — Telephone Encounter (Signed)
Patient has been informed.

## 2017-01-01 DIAGNOSIS — D2271 Melanocytic nevi of right lower limb, including hip: Secondary | ICD-10-CM | POA: Diagnosis not present

## 2017-01-01 DIAGNOSIS — C44629 Squamous cell carcinoma of skin of left upper limb, including shoulder: Secondary | ICD-10-CM | POA: Diagnosis not present

## 2017-01-01 DIAGNOSIS — D485 Neoplasm of uncertain behavior of skin: Secondary | ICD-10-CM | POA: Diagnosis not present

## 2017-01-01 DIAGNOSIS — D2261 Melanocytic nevi of right upper limb, including shoulder: Secondary | ICD-10-CM | POA: Diagnosis not present

## 2017-01-01 DIAGNOSIS — D225 Melanocytic nevi of trunk: Secondary | ICD-10-CM | POA: Diagnosis not present

## 2017-01-01 DIAGNOSIS — L821 Other seborrheic keratosis: Secondary | ICD-10-CM | POA: Diagnosis not present

## 2017-01-14 ENCOUNTER — Other Ambulatory Visit (HOSPITAL_COMMUNITY)
Admission: RE | Admit: 2017-01-14 | Discharge: 2017-01-14 | Disposition: A | Discharge status: Home / Self Care | Payer: PPO | Source: Ambulatory Visit | Attending: Family | Admitting: Family

## 2017-01-14 ENCOUNTER — Ambulatory Visit (INDEPENDENT_AMBULATORY_CARE_PROVIDER_SITE_OTHER): Payer: PPO | Admitting: Family

## 2017-01-14 ENCOUNTER — Encounter: Payer: Self-pay | Admitting: Family

## 2017-01-14 VITALS — BP 156/90 | HR 78 | Temp 98.1°F | Ht 67.75 in | Wt 198.4 lb

## 2017-01-14 DIAGNOSIS — I10 Essential (primary) hypertension: Secondary | ICD-10-CM

## 2017-01-14 DIAGNOSIS — Z0000 Encounter for general adult medical examination without abnormal findings: Secondary | ICD-10-CM | POA: Insufficient documentation

## 2017-01-14 NOTE — Progress Notes (Signed)
Pre visit review using our clinic review tool, if applicable. No additional management support is needed unless otherwise documented below in the visit note. 

## 2017-01-14 NOTE — Progress Notes (Signed)
Subjective:    Patient ID: Alicia Ramos, female    DOB: 13-Apr-1951, 66 y.o.   MRN: [id number]  CC: Alicia Ramos is a 66 y.o. female who presents today for physical exam.    HPI: HLD- doing well on pravachol.   HTN- compliant with medications. averages at home 110/71. Denies exertional chest pain or pressure, numbness or tingling radiating to left arm or jaw, palpitations, dizziness, frequent headaches, changes in vision, or shortness of breath.      Colorectal Cancer Screening: due Breast Cancer Screening: Mammogram UTD Cervical Cancer Screening: h/o hysterectomy; pap smears showed pre-cancer cells. Unsure if she has cervix or not.  Bone Health screening/DEXA for 65+: No increased fracture risk. Defer screening at this time. Lung Cancer Screening: . quit 13 years ago, smoker 30+ years.         Tetanus - due        Pneumococcal - Candidate for; declines Labs: Screening labs done prior Exercise: Gets regular exercise.  Alcohol use: occasional Smoking/tobacco use: former smoker.  Regular dental exams: UTD Wears seat belt: Yes. Skin: has skin cancer; follows with derm  HISTORY:  Past Medical History:  Diagnosis Date  . Esophageal ulcer 2000   Required blood transfusion  . HTN (hypertension)   . Hyperlipidemia    Tried statin but had severe myalgia  . Shingles 2001   Left chest  . Skin cancer 2018   left shoulder  . Vaginal delivery    x1    Past Surgical History:  Procedure Laterality Date  . ABDOMINAL HYSTERECTOMY     no cervix seen on pelvic exam; 12/2016, Alicia Ramos   Family History  Problem Relation Age of Onset  . Hypertension Mother   . Lung cancer Father   . Hypertension Sister   . Breast cancer Paternal Aunt       ALLERGIES: Ace inhibitors; Losartan; and Statins  Current Outpatient Prescriptions on File Prior to Visit  Medication Sig Dispense Refill  . amLODipine (NORVASC) 2.5 MG tablet Take 1 tablet (2.5 mg total) by mouth daily. 90 tablet 3  .  azithromycin (ZITHROMAX) 250 MG tablet Take 2 tablets ( total 500 mg) PO on day 1, then take 1 tablet ( total 250 mg) by mouth q24h x 4 days. 6 tablet 0  . cholecalciferol (VITAMIN D) 1000 UNITS tablet Take 2,000 Units by mouth daily.    . metoprolol succinate (TOPROL-XL) 50 MG 24 hr tablet Take 1 tablet (50 mg total) by mouth daily. Take with or immediately following a meal. 90 tablet 3  . Multiple Vitamins-Minerals (MULTIVITAMIN WITH MINERALS) tablet Take 1 tablet by mouth daily.    . Omega-3 Fatty Acids (FISH OIL) 1000 MG CAPS Take by mouth.    . pravastatin (PRAVACHOL) 40 MG tablet Take 1 tablet (40 mg total) by mouth daily. 90 tablet 2  . promethazine-dextromethorphan (PROMETHAZINE-DM) 6.25-15 MG/5ML syrup Take 5 mLs by mouth at bedtime as needed for cough. 40 mL 1   No current facility-administered medications on file prior to visit.     Social History  Substance Use Topics  . Smoking status: Former Smoker    Quit date: 08/01/2003  . Smokeless tobacco: Never Used  . Alcohol use Yes     Comment: Occasional    Review of Systems  Constitutional: Negative for chills, fever and unexpected weight change.  HENT: Negative for congestion.   Respiratory: Negative for cough.   Cardiovascular: Negative for chest pain, palpitations and leg swelling.  Gastrointestinal: Negative for nausea and vomiting.  Musculoskeletal: Negative for arthralgias and myalgias.  Skin: Negative for rash.  Neurological: Negative for headaches.  Hematological: Negative for adenopathy.  Psychiatric/Behavioral: Negative for confusion.      Objective:    BP (!) 156/90   Pulse 78   Temp 98.1 F (36.7 C) (Oral)   Ht 5' 7.75" (1.721 m)   Wt 198 lb 6.4 oz (90 kg)   SpO2 98%   BMI 30.39 kg/m   BP Readings from Last 3 Encounters:  01/14/17 (!) 156/90  11/12/16 (!) 138/96  06/30/16 140/88   Wt Readings from Last 3 Encounters:  01/14/17 198 lb 6.4 oz (90 kg)  11/12/16 207 lb (93.9 kg)  06/30/16 211 lb 4 oz  (95.8 kg)    Physical Exam  Constitutional: She appears well-developed and well-nourished.  Eyes: Conjunctivae are normal.  Neck: No thyroid mass and no thyromegaly present.  Cardiovascular: Normal rate, regular rhythm, normal heart sounds and normal pulses.   Pulmonary/Chest: Effort normal and breath sounds normal. She has no wheezes. She has no rhonchi. She has no rales. Right breast exhibits no inverted nipple, no mass, no nipple discharge, no skin change and no tenderness. Left breast exhibits no inverted nipple, no mass, no nipple discharge, no skin change and no tenderness. Breasts are symmetrical.  CBE performed.   Genitourinary: Uterus is not enlarged, not fixed and not tender. Cervix exhibits no motion tenderness, no discharge and no friability. Right adnexum displays no mass, no tenderness and no fullness. Left adnexum displays no mass, no tenderness and no fullness.  Genitourinary Comments: Pap performed of vaginal wall. No cervix visualized.  Unable to appreciated ovaries.  Lymphadenopathy:       Head (right side): No submental, no submandibular, no tonsillar, no preauricular, no posterior auricular and no occipital adenopathy present.       Head (left side): No submental, no submandibular, no tonsillar, no preauricular, no posterior auricular and no occipital adenopathy present.    She has no cervical adenopathy.       Right cervical: No superficial cervical, no deep cervical and no posterior cervical adenopathy present.      Left cervical: No superficial cervical, no deep cervical and no posterior cervical adenopathy present.    She has no axillary adenopathy.       Right axillary: No pectoral and no lateral adenopathy present.       Left axillary: No pectoral and no lateral adenopathy present. Neurological: She is alert.  Skin: Skin is warm and dry.  Psychiatric: She has a normal mood and affect. Her speech is normal and behavior is normal. Thought content normal.  Vitals  reviewed.      Assessment & Plan:   Problem List Items Addressed This Visit      Cardiovascular and Mediastinum   Hypertension    Elevated. No CP, SOB. We agreed to monitor as readings at home at goal. She will return next week for bp check with provider and bring BP monitor to calibrate.         Other   Routine general medical examination at a health care facility - Primary    No cervix visualized and now notated in chart. Decided to perform Pap and vaginal wall based on patient's remote report of precancerous cells. Mammogram up-to-date. Order colonoscopy. Discussed with patient her smoking history, and stronly advised she pursues CT chest. Order placed. She politely declines pneumococcal vaccine today. Advised that she may have pneumococcal  and tetanus vaccine at her local pharmacy. Currently following with dermatology      Relevant Orders   Ambulatory referral to Gastroenterology   CT CHEST LUNG CANCER SCREENING LOW DOSE WO CONTRAST   Cytology - PAP       I am having Ms. Leighton maintain her cholecalciferol, multivitamin with minerals, Fish Oil, metoprolol succinate, azithromycin, promethazine-dextromethorphan, amLODipine, and pravastatin.   No orders of the defined types were placed in this encounter.   Return precautions given.   Risks, benefits, and alternatives of the medications and treatment plan prescribed today were discussed, and patient expressed understanding.   Education regarding symptom management and diagnosis given to patient on AVS.   Continue to follow with [name], FNP for routine health maintenance.   Alicia Ramos and I agreed with plan.   [name], FNP

## 2017-01-14 NOTE — Assessment & Plan Note (Signed)
Elevated. No CP, SOB. We agreed to monitor as readings at home at goal. She will return next week for bp check with provider and bring BP monitor to calibrate.

## 2017-01-14 NOTE — Assessment & Plan Note (Addendum)
No cervix visualized and now notated in chart. Decided to perform Pap and vaginal wall based on patient's remote report of precancerous cells. Mammogram up-to-date. Order colonoscopy. Discussed with patient her smoking history, and stronly advised she pursues CT chest. Order placed. She politely declines pneumococcal vaccine today. Advised that she may have pneumococcal and tetanus vaccine at her local pharmacy. Currently following with dermatology

## 2017-01-14 NOTE — Patient Instructions (Addendum)
Make an appt with one of my colleagues next week for blood pressure check - please bring BP cuff  CT chest lung screen- Shawn, nurse, will call you first. Ensure you speak him prior to the test to ensure covered by insurance.   Tdap ( tetanus ) vaccine at local pharmacy.   Also due for pneumovax vaccine as well -may have done here or at pharmacy.   This is  Dr. Lupita Dawn  example of a  "Low GI"  Diet:  It will allow you to lose 4 to 8  lbs  per month if you follow it carefully.  Your goal with exercise is a minimum of 30 minutes of aerobic exercise 5 days per week (Walking does not count once it becomes easy!)    All of the foods can be found at grocery stores and in bulk at [employer].  The Atkins protein bars and shakes are available in more varieties at [employer], [employer] and [employer].     7 AM Breakfast:  Choose from the following:  Low carbohydrate Protein  Shakes (I recommend the  Premier Protein chocolate shakes,  EAS AdvantEdge "Carb Control" shakes  Or the Atkins shakes all are under 3 net carbs)     a scrambled egg/bacon/cheese burrito made with Mission's "carb balance" whole wheat tortilla  (about 10 net carbs )  Regulatory affairs officer (basically a quiche without the pastry crust) that is eaten cold and very convenient way to get your eggs.  8 carbs)  If you make your own protein shakes, avoid bananas and pineapple,  And use low carb greek yogurt or original /unsweetened almond or soy milk    Avoid cereal and bananas, oatmeal and cream of wheat and grits. They are loaded with carbohydrates!   10 AM: high protein snack:  Protein bar by Atkins (the snack size, under 200 cal, usually < 6 net carbs).    A stick of cheese:  Around 1 carb,  100 cal     Dannon Light n Fit [place] Yogurt  (80 cal, 8 carbs)  Other so called "protein bars" and Greek yogurts tend to be loaded with carbohydrates.  Remember, in food advertising, the word "energy" is synonymous for "  carbohydrate."  Lunch:   A Sandwich using the bread choices listed, Can use any  Eggs,  lunchmeat, grilled meat or canned tuna), avocado, regular mayo/mustard  and cheese.  A Salad using blue cheese, ranch,  Goddess or vinagrette,  Avoid taco shells, croutons or "confetti" and no "candied nuts" but regular nuts OK.   No pretzels, nabs  or chips.  Pickles and miniature sweet peppers are a good low carb alternative that provide a "crunch"  The bread is the only source of carbohydrate in a sandwich and  can be decreased by trying some of the attached alternatives to traditional loaf bread   Avoid "Low fat dressings, as well as [place] dressings They are loaded with sugar!   3 PM/ Mid day  Snack:  Consider  1 ounce of  almonds, walnuts, pistachios, pecans, peanuts,  Macadamia nuts or a nut medley.  Avoid "granola and granola bars "  Mixed nuts are ok in moderation as long as there are no raisins,  cranberries or dried fruit.   KIND bars are OK if you get the low glycemic index variety   Try the prosciutto/mozzarella cheese sticks by [employer]  In deli /backery section   High protein  6 PM  Dinner:     Meat/fowl/fish with a green salad, and either broccoli, cauliflower, green beans, spinach, brussel sprouts or  Lima beans. DO NOT BREAD THE PROTEIN!!      There is a low carb pasta by Dreamfield's that is acceptable and tastes great: only 5 digestible carbs/serving.( All grocery stores but BJs carry it ) Several ready made meals are available low carb:   Try Michel Angelo's chicken piccata or chicken or eggplant parm over low carb pasta.(Lowes and BJs)   Marjory Lies Sanchez's "Carnitas" (pulled pork, no sauce,  0 carbs) or his beef pot roast to make a dinner burrito (at [employer])  Pesto over low carb pasta (bj's sells a good quality pesto in the center refrigerated section of the deli   Try satueeing  Cheral Marker with mushroooms as a good side   [employer] makes a mashed cauliflower  that tastes like mashed potatoes  Whole wheat pasta is still full of digestible carbs and  Not as low in glycemic index as Dreamfield's.   Brown rice is still rice,  So skip the rice and noodles if you eat [place] or [place] (or at least limit to 1/2 cup)  9 PM snack :   Breyer's "low carb" fudgsicle or  ice cream bar (Carb Smart line), or  Weight Watcher's ice cream bar , or another "no sugar added" ice cream;  a serving of fresh berries/cherries with whipped cream   Cheese or DANNON'S LlGHT N FIT GREEK YOGURT  8 ounces of [place] unsweetened almond/cococunut milk    Treat yourself to a parfait made with whipped cream blueberiies, walnuts and vanilla greek yogurt  Avoid bananas, pineapple, grapes  and watermelon on a regular basis because they are high in sugar.  THINK OF THEM AS DESSERT  Remember that snack Substitutions should be less than 10 NET carbs per serving and meals < 20 carbs. Remember to subtract fiber grams to get the "net carbs."  '@TULLOBREADPACKAGE'$ @  Health Maintenance, Female Adopting a healthy lifestyle and getting preventive care can go a long way to promote health and wellness. Talk with your health care provider about what schedule of regular examinations is right for you. This is a good chance for you to check in with your provider about disease prevention and staying healthy. In between checkups, there are plenty of things you can do on your own. Experts have done a lot of research about which lifestyle changes and preventive measures are most likely to keep you healthy. Ask your health care provider for more information. Weight and diet Eat a healthy diet  Be sure to include plenty of vegetables, fruits, low-fat dairy products, and lean protein.  Do not eat a lot of foods high in solid fats, added sugars, or salt.  Get regular exercise. This is one of the most important things you can do for your health. ? Most adults should exercise for at least 150 minutes  each week. The exercise should increase your heart rate and make you sweat (moderate-intensity exercise). ? Most adults should also do strengthening exercises at least twice a week. This is in addition to the moderate-intensity exercise.  Maintain a healthy weight  Body mass index (BMI) is a measurement that can be used to identify possible weight problems. It estimates body fat based on height and weight. Your health care provider can help determine your BMI and help you achieve or maintain a healthy weight.  For females 43 years of age  and older: ? A BMI below 18.5 is considered underweight. ? A BMI of 18.5 to 24.9 is normal. ? A BMI of 25 to 29.9 is considered overweight. ? A BMI of 30 and above is considered obese.  Watch levels of cholesterol and blood lipids  You should start having your blood tested for lipids and cholesterol at 67 years of age, then have this test every 5 years.  You may need to have your cholesterol levels checked more often if: ? Your lipid or cholesterol levels are high. ? You are older than 67 years of age. ? You are at high risk for heart disease.  Cancer screening Lung Cancer  Lung cancer screening is recommended for adults 88-57 years old who are at high risk for lung cancer because of a history of smoking.  A yearly low-dose CT scan of the lungs is recommended for people who: ? Currently smoke. ? Have quit within the past 15 years. ? Have at least a 30-pack-year history of smoking. A pack year is smoking an average of one pack of cigarettes a day for 1 year.  Yearly screening should continue until it has been 15 years since you quit.  Yearly screening should stop if you develop a health problem that would prevent you from having lung cancer treatment.  Breast Cancer  Practice breast self-awareness. This means understanding how your breasts normally appear and feel.  It also means doing regular breast self-exams. Let your health care provider  know about any changes, no matter how small.  If you are in your 20s or 30s, you should have a clinical breast exam (CBE) by a health care provider every 1-3 years as part of a regular health exam.  If you are 78 or older, have a CBE every year. Also consider having a breast X-ray (mammogram) every year.  If you have a family history of breast cancer, talk to your health care provider about genetic screening.  If you are at high risk for breast cancer, talk to your health care provider about having an MRI and a mammogram every year.  Breast cancer gene (BRCA) assessment is recommended for women who have family members with BRCA-related cancers. BRCA-related cancers include: ? Breast. ? Ovarian. ? Tubal. ? Peritoneal cancers.  Results of the assessment will determine the need for genetic counseling and BRCA1 and BRCA2 testing.  Cervical Cancer Your health care provider may recommend that you be screened regularly for cancer of the pelvic organs (ovaries, uterus, and vagina). This screening involves a pelvic examination, including checking for microscopic changes to the surface of your cervix (Pap test). You may be encouraged to have this screening done every 3 years, beginning at age 59.  For women ages 68-65, health care providers may recommend pelvic exams and Pap testing every 3 years, or they may recommend the Pap and pelvic exam, combined with testing for human papilloma virus (HPV), every 5 years. Some types of HPV increase your risk of cervical cancer. Testing for HPV may also be done on women of any age with unclear Pap test results.  Other health care providers may not recommend any screening for nonpregnant women who are considered low risk for pelvic cancer and who do not have symptoms. Ask your health care provider if a screening pelvic exam is right for you.  If you have had past treatment for cervical cancer or a condition that could lead to cancer, you need Pap tests and  screening for cancer for  at least 20 years after your treatment. If Pap tests have been discontinued, your risk factors (such as having a new sexual partner) need to be reassessed to determine if screening should resume. Some women have medical problems that increase the chance of getting cervical cancer. In these cases, your health care provider may recommend more frequent screening and Pap tests.  Colorectal Cancer  This type of cancer can be detected and often prevented.  Routine colorectal cancer screening usually begins at 67 years of age and continues through 67 years of age.  Your health care provider may recommend screening at an earlier age if you have risk factors for colon cancer.  Your health care provider may also recommend using home test kits to check for hidden blood in the stool.  A small camera at the end of a tube can be used to examine your colon directly (sigmoidoscopy or colonoscopy). This is done to check for the earliest forms of colorectal cancer.  Routine screening usually begins at age 4.  Direct examination of the colon should be repeated every 5-10 years through 67 years of age. However, you may need to be screened more often if early forms of precancerous polyps or small growths are found.  Skin Cancer  Check your skin from head to toe regularly.  Tell your health care provider about any new moles or changes in moles, especially if there is a change in a mole's shape or color.  Also tell your health care provider if you have a mole that is larger than the size of a pencil eraser.  Always use sunscreen. Apply sunscreen liberally and repeatedly throughout the day.  Protect yourself by wearing long sleeves, pants, a wide-brimmed hat, and sunglasses whenever you are outside.  Heart disease, diabetes, and high blood pressure  High blood pressure causes heart disease and increases the risk of stroke. High blood pressure is more likely to develop in: ? People  who have blood pressure in the high end of the normal range (130-139/85-89 mm Hg). ? People who are overweight or obese. ? People who are African American.  If you are 48-29 years of age, have your blood pressure checked every 3-5 years. If you are 84 years of age or older, have your blood pressure checked every year. You should have your blood pressure measured twice-once when you are at a hospital or clinic, and once when you are not at a hospital or clinic. Record the average of the two measurements. To check your blood pressure when you are not at a hospital or clinic, you can use: ? An automated blood pressure machine at a pharmacy. ? A home blood pressure monitor.  If you are between 35 years and 72 years old, ask your health care provider if you should take aspirin to prevent strokes.  Have regular diabetes screenings. This involves taking a blood sample to check your fasting blood sugar level. ? If you are at a normal weight and have a low risk for diabetes, have this test once every three years after 67 years of age. ? If you are overweight and have a high risk for diabetes, consider being tested at a younger age or more often. Preventing infection Hepatitis B  If you have a higher risk for hepatitis B, you should be screened for this virus. You are considered at high risk for hepatitis B if: ? You were born in a country where hepatitis B is common. Ask your health care provider which  countries are considered high risk. ? Your parents were born in a high-risk country, and you have not been immunized against hepatitis B (hepatitis B vaccine). ? You have HIV or AIDS. ? You use needles to inject street drugs. ? You live with someone who has hepatitis B. ? You have had sex with someone who has hepatitis B. ? You get hemodialysis treatment. ? You take certain medicines for conditions, including cancer, organ transplantation, and autoimmune conditions.  Hepatitis C  Blood testing is  recommended for: ? Everyone born from [birth year] through 1966. ? Anyone with known risk factors for hepatitis C.  Sexually transmitted infections (STIs)  You should be screened for sexually transmitted infections (STIs) including gonorrhea and chlamydia if: ? You are sexually active and are younger than 67 years of age. ? You are older than 67 years of age and your health care provider tells you that you are at risk for this type of infection. ? Your sexual activity has changed since you were last screened and you are at an increased risk for chlamydia or gonorrhea. Ask your health care provider if you are at risk.  If you do not have HIV, but are at risk, it may be recommended that you take a prescription medicine daily to prevent HIV infection. This is called pre-exposure prophylaxis (PrEP). You are considered at risk if: ? You are sexually active and do not regularly use condoms or know the HIV status of your partner(s). ? You take drugs by injection. ? You are sexually active with a partner who has HIV.  Talk with your health care provider about whether you are at high risk of being infected with HIV. If you choose to begin PrEP, you should first be tested for HIV. You should then be tested every 3 months for as long as you are taking PrEP. Pregnancy  If you are premenopausal and you may become pregnant, ask your health care provider about preconception counseling.  If you may become pregnant, take 400 to 800 micrograms (mcg) of folic acid every day.  If you want to prevent pregnancy, talk to your health care provider about birth control (contraception). Osteoporosis and menopause  Osteoporosis is a disease in which the bones lose minerals and strength with aging. This can result in serious bone fractures. Your risk for osteoporosis can be identified using a bone density scan.  If you are 51 years of age or older, or if you are at risk for osteoporosis and fractures, ask your health care  provider if you should be screened.  Ask your health care provider whether you should take a calcium or vitamin D supplement to lower your risk for osteoporosis.  Menopause may have certain physical symptoms and risks.  Hormone replacement therapy may reduce some of these symptoms and risks. Talk to your health care provider about whether hormone replacement therapy is right for you. Follow these instructions at home:  Schedule regular health, dental, and eye exams.  Stay current with your immunizations.  Do not use any tobacco products including cigarettes, chewing tobacco, or electronic cigarettes.  If you are pregnant, do not drink alcohol.  If you are breastfeeding, limit how much and how often you drink alcohol.  Limit alcohol intake to no more than 1 drink per day for nonpregnant women. One drink equals 12 ounces of beer, 5 ounces of wine, or 1 ounces of hard liquor.  Do not use street drugs.  Do not share needles.  Ask your health  care provider for help if you need support or information about quitting drugs.  Tell your health care provider if you often feel depressed.  Tell your health care provider if you have ever been abused or do not feel safe at home. This information is not intended to replace advice given to you by your health care provider. Make sure you discuss any questions you have with your health care provider. Document Released: 01/27/2011 Document Revised: 12/20/2015 Document Reviewed: 04/17/2015 Elsevier Interactive Patient Education  2018 [place] Maintenance, Female Adopting a healthy lifestyle and getting preventive care can go a long way to promote health and wellness. Talk with your health care provider about what schedule of regular examinations is right for you. This is a good chance for you to check in with your provider about disease prevention and staying healthy. In between checkups, there are plenty of things you can do on your  own. Experts have done a lot of research about which lifestyle changes and preventive measures are most likely to keep you healthy. Ask your health care provider for more information. Weight and diet Eat a healthy diet  Be sure to include plenty of vegetables, fruits, low-fat dairy products, and lean protein.  Do not eat a lot of foods high in solid fats, added sugars, or salt.  Get regular exercise. This is one of the most important things you can do for your health. ? Most adults should exercise for at least 150 minutes each week. The exercise should increase your heart rate and make you sweat (moderate-intensity exercise). ? Most adults should also do strengthening exercises at least twice a week. This is in addition to the moderate-intensity exercise.  Maintain a healthy weight  Body mass index (BMI) is a measurement that can be used to identify possible weight problems. It estimates body fat based on height and weight. Your health care provider can help determine your BMI and help you achieve or maintain a healthy weight.  For females 60 years of age and older: ? A BMI below 18.5 is considered underweight. ? A BMI of 18.5 to 24.9 is normal. ? A BMI of 25 to 29.9 is considered overweight. ? A BMI of 30 and above is considered obese.  Watch levels of cholesterol and blood lipids  You should start having your blood tested for lipids and cholesterol at 67 years of age, then have this test every 5 years.  You may need to have your cholesterol levels checked more often if: ? Your lipid or cholesterol levels are high. ? You are older than 67 years of age. ? You are at high risk for heart disease.  Cancer screening Lung Cancer  Lung cancer screening is recommended for adults 49-4 years old who are at high risk for lung cancer because of a history of smoking.  A yearly low-dose CT scan of the lungs is recommended for people who: ? Currently smoke. ? Have quit within the past 15  years. ? Have at least a 30-pack-year history of smoking. A pack year is smoking an average of one pack of cigarettes a day for 1 year.  Yearly screening should continue until it has been 15 years since you quit.  Yearly screening should stop if you develop a health problem that would prevent you from having lung cancer treatment.  Breast Cancer  Practice breast self-awareness. This means understanding how your breasts normally appear and feel.  It also means doing regular breast self-exams. Let your  health care provider know about any changes, no matter how small.  If you are in your 20s or 30s, you should have a clinical breast exam (CBE) by a health care provider every 1-3 years as part of a regular health exam.  If you are 52 or older, have a CBE every year. Also consider having a breast X-ray (mammogram) every year.  If you have a family history of breast cancer, talk to your health care provider about genetic screening.  If you are at high risk for breast cancer, talk to your health care provider about having an MRI and a mammogram every year.  Breast cancer gene (BRCA) assessment is recommended for women who have family members with BRCA-related cancers. BRCA-related cancers include: ? Breast. ? Ovarian. ? Tubal. ? Peritoneal cancers.  Results of the assessment will determine the need for genetic counseling and BRCA1 and BRCA2 testing.  Cervical Cancer Your health care provider may recommend that you be screened regularly for cancer of the pelvic organs (ovaries, uterus, and vagina). This screening involves a pelvic examination, including checking for microscopic changes to the surface of your cervix (Pap test). You may be encouraged to have this screening done every 3 years, beginning at age 61.  For women ages 69-65, health care providers may recommend pelvic exams and Pap testing every 3 years, or they may recommend the Pap and pelvic exam, combined with testing for human  papilloma virus (HPV), every 5 years. Some types of HPV increase your risk of cervical cancer. Testing for HPV may also be done on women of any age with unclear Pap test results.  Other health care providers may not recommend any screening for nonpregnant women who are considered low risk for pelvic cancer and who do not have symptoms. Ask your health care provider if a screening pelvic exam is right for you.  If you have had past treatment for cervical cancer or a condition that could lead to cancer, you need Pap tests and screening for cancer for at least 20 years after your treatment. If Pap tests have been discontinued, your risk factors (such as having a new sexual partner) need to be reassessed to determine if screening should resume. Some women have medical problems that increase the chance of getting cervical cancer. In these cases, your health care provider may recommend more frequent screening and Pap tests.  Colorectal Cancer  This type of cancer can be detected and often prevented.  Routine colorectal cancer screening usually begins at 67 years of age and continues through 67 years of age.  Your health care provider may recommend screening at an earlier age if you have risk factors for colon cancer.  Your health care provider may also recommend using home test kits to check for hidden blood in the stool.  A small camera at the end of a tube can be used to examine your colon directly (sigmoidoscopy or colonoscopy). This is done to check for the earliest forms of colorectal cancer.  Routine screening usually begins at age 44.  Direct examination of the colon should be repeated every 5-10 years through 67 years of age. However, you may need to be screened more often if early forms of precancerous polyps or small growths are found.  Skin Cancer  Check your skin from head to toe regularly.  Tell your health care provider about any new moles or changes in moles, especially if there is  a change in a mole's shape or color.  Also  tell your health care provider if you have a mole that is larger than the size of a pencil eraser.  Always use sunscreen. Apply sunscreen liberally and repeatedly throughout the day.  Protect yourself by wearing long sleeves, pants, a wide-brimmed hat, and sunglasses whenever you are outside.  Heart disease, diabetes, and high blood pressure  High blood pressure causes heart disease and increases the risk of stroke. High blood pressure is more likely to develop in: ? People who have blood pressure in the high end of the normal range (130-139/85-89 mm Hg). ? People who are overweight or obese. ? People who are African American.  If you are 67-51 years of age, have your blood pressure checked every 3-5 years. If you are 3 years of age or older, have your blood pressure checked every year. You should have your blood pressure measured twice-once when you are at a hospital or clinic, and once when you are not at a hospital or clinic. Record the average of the two measurements. To check your blood pressure when you are not at a hospital or clinic, you can use: ? An automated blood pressure machine at a pharmacy. ? A home blood pressure monitor.  If you are between 66 years and 55 years old, ask your health care provider if you should take aspirin to prevent strokes.  Have regular diabetes screenings. This involves taking a blood sample to check your fasting blood sugar level. ? If you are at a normal weight and have a low risk for diabetes, have this test once every three years after 67 years of age. ? If you are overweight and have a high risk for diabetes, consider being tested at a younger age or more often. Preventing infection Hepatitis B  If you have a higher risk for hepatitis B, you should be screened for this virus. You are considered at high risk for hepatitis B if: ? You were born in a country where hepatitis B is common. Ask your health  care provider which countries are considered high risk. ? Your parents were born in a high-risk country, and you have not been immunized against hepatitis B (hepatitis B vaccine). ? You have HIV or AIDS. ? You use needles to inject street drugs. ? You live with someone who has hepatitis B. ? You have had sex with someone who has hepatitis B. ? You get hemodialysis treatment. ? You take certain medicines for conditions, including cancer, organ transplantation, and autoimmune conditions.  Hepatitis C  Blood testing is recommended for: ? Everyone born from [birth year] through 1965. ? Anyone with known risk factors for hepatitis C.  Sexually transmitted infections (STIs)  You should be screened for sexually transmitted infections (STIs) including gonorrhea and chlamydia if: ? You are sexually active and are younger than 67 years of age. ? You are older than 67 years of age and your health care provider tells you that you are at risk for this type of infection. ? Your sexual activity has changed since you were last screened and you are at an increased risk for chlamydia or gonorrhea. Ask your health care provider if you are at risk.  If you do not have HIV, but are at risk, it may be recommended that you take a prescription medicine daily to prevent HIV infection. This is called pre-exposure prophylaxis (PrEP). You are considered at risk if: ? You are sexually active and do not regularly use condoms or know the HIV status of your partner(s). ?  You take drugs by injection. ? You are sexually active with a partner who has HIV.  Talk with your health care provider about whether you are at high risk of being infected with HIV. If you choose to begin PrEP, you should first be tested for HIV. You should then be tested every 3 months for as long as you are taking PrEP. Pregnancy  If you are premenopausal and you may become pregnant, ask your health care provider about preconception counseling.  If you  may become pregnant, take 400 to 800 micrograms (mcg) of folic acid every day.  If you want to prevent pregnancy, talk to your health care provider about birth control (contraception). Osteoporosis and menopause  Osteoporosis is a disease in which the bones lose minerals and strength with aging. This can result in serious bone fractures. Your risk for osteoporosis can be identified using a bone density scan.  If you are 42 years of age or older, or if you are at risk for osteoporosis and fractures, ask your health care provider if you should be screened.  Ask your health care provider whether you should take a calcium or vitamin D supplement to lower your risk for osteoporosis.  Menopause may have certain physical symptoms and risks.  Hormone replacement therapy may reduce some of these symptoms and risks. Talk to your health care provider about whether hormone replacement therapy is right for you. Follow these instructions at home:  Schedule regular health, dental, and eye exams.  Stay current with your immunizations.  Do not use any tobacco products including cigarettes, chewing tobacco, or electronic cigarettes.  If you are pregnant, do not drink alcohol.  If you are breastfeeding, limit how much and how often you drink alcohol.  Limit alcohol intake to no more than 1 drink per day for nonpregnant women. One drink equals 12 ounces of beer, 5 ounces of wine, or 1 ounces of hard liquor.  Do not use street drugs.  Do not share needles.  Ask your health care provider for help if you need support or information about quitting drugs.  Tell your health care provider if you often feel depressed.  Tell your health care provider if you have ever been abused or do not feel safe at home. This information is not intended to replace advice given to you by your health care provider. Make sure you discuss any questions you have with your health care provider. Document Released: 01/27/2011  Document Revised: 12/20/2015 Document Reviewed: 04/17/2015 Elsevier Interactive Patient Education  [employer].

## 2017-01-15 LAB — CYTOLOGY - PAP
Diagnosis: NEGATIVE
HPV (WINDOPATH): NOT DETECTED

## 2017-01-20 ENCOUNTER — Ambulatory Visit: Payer: PPO | Admitting: Family

## 2017-01-21 ENCOUNTER — Telehealth: Payer: Self-pay | Admitting: *Deleted

## 2017-01-21 NOTE — Telephone Encounter (Signed)
Received referral for low dose lung cancer screening CT scan. Message left at phone number listed in EMR for patient to call me back to facilitate scheduling scan.  

## 2017-01-23 ENCOUNTER — Telehealth: Payer: Self-pay | Admitting: *Deleted

## 2017-01-23 DIAGNOSIS — Z87891 Personal history of nicotine dependence: Secondary | ICD-10-CM

## 2017-01-23 NOTE — Telephone Encounter (Signed)
Received referral for initial lung cancer screening scan. Contacted patient and obtained smoking history,(former, quit 08/01/03, 39 pack year) as well as answering questions related to screening process. Patient denies signs of lung cancer such as weight loss or hemoptysis. Patient denies comorbidity that would prevent curative treatment if lung cancer were found. Patient is scheduled for shared decision making visit and CT scan on 02/10/17.

## 2017-02-04 ENCOUNTER — Ambulatory Visit (INDEPENDENT_AMBULATORY_CARE_PROVIDER_SITE_OTHER): Payer: PPO | Admitting: Family

## 2017-02-04 ENCOUNTER — Encounter: Payer: Self-pay | Admitting: Family

## 2017-02-04 DIAGNOSIS — L905 Scar conditions and fibrosis of skin: Secondary | ICD-10-CM | POA: Diagnosis not present

## 2017-02-04 DIAGNOSIS — I10 Essential (primary) hypertension: Secondary | ICD-10-CM

## 2017-02-04 DIAGNOSIS — C44629 Squamous cell carcinoma of skin of left upper limb, including shoulder: Secondary | ICD-10-CM | POA: Diagnosis not present

## 2017-02-04 NOTE — Assessment & Plan Note (Signed)
At goal. Will continue current regimen.  

## 2017-02-04 NOTE — Progress Notes (Signed)
Subjective:    Patient ID: Alicia Ramos, female    DOB: Dec 10, 1950, 66 y.o.   MRN: [id number]  CC: Alicia Ramos is a 66 y.o. female who presents today for follow up.   HPI: Follow-up on blood pressure which is been elevated last week at physical after starting amlodipine.   HTN- Compliant with medication. Denies exertional chest pain or pressure, numbness or tingling radiating to left arm or jaw, palpitations, dizziness, frequent headaches, changes in vision, or shortness of breath.      HISTORY:  Past Medical History:  Diagnosis Date  . Esophageal ulcer 2000   Required blood transfusion  . HTN (hypertension)   . Hyperlipidemia    Tried statin but had severe myalgia  . Shingles 2001   Left chest  . Skin cancer 2018   left shoulder  . Vaginal delivery    x1   Past Surgical History:  Procedure Laterality Date  . ABDOMINAL HYSTERECTOMY     no cervix seen on pelvic exam; 12/2016, [facility]   Family History  Problem Relation Age of Onset  . Hypertension Mother   . Lung cancer Father   . Hypertension Sister   . Breast cancer Paternal Aunt     Allergies: Ace inhibitors; Losartan; and Statins Current Outpatient Prescriptions on File Prior to Visit  Medication Sig Dispense Refill  . amLODipine (NORVASC) 2.5 MG tablet Take 1 tablet (2.5 mg total) by mouth daily. 90 tablet 3  . azithromycin (ZITHROMAX) 250 MG tablet Take 2 tablets ( total 500 mg) PO on day 1, then take 1 tablet ( total 250 mg) by mouth q24h x 4 days. 6 tablet 0  . cholecalciferol (VITAMIN D) 1000 UNITS tablet Take 2,000 Units by mouth daily.    . metoprolol succinate (TOPROL-XL) 50 MG 24 hr tablet Take 1 tablet (50 mg total) by mouth daily. Take with or immediately following a meal. 90 tablet 3  . Multiple Vitamins-Minerals (MULTIVITAMIN WITH MINERALS) tablet Take 1 tablet by mouth daily.    . Omega-3 Fatty Acids (FISH OIL) 1000 MG CAPS Take by mouth.    . pravastatin (PRAVACHOL) 40 MG tablet Take 1 tablet  (40 mg total) by mouth daily. 90 tablet 2  . promethazine-dextromethorphan (PROMETHAZINE-DM) 6.25-15 MG/5ML syrup Take 5 mLs by mouth at bedtime as needed for cough. 40 mL 1   No current facility-administered medications on file prior to visit.     Social History  Substance Use Topics  . Smoking status: Former Smoker    Quit date: 08/01/2003  . Smokeless tobacco: Never Used  . Alcohol use Yes     Comment: Occasional    Review of Systems  Constitutional: Negative for chills and fever.  Respiratory: Negative for cough.   Cardiovascular: Negative for chest pain and palpitations.  Gastrointestinal: Negative for nausea and vomiting.      Objective:    BP 136/88   Pulse 77   Temp 97.8 F (36.6 C) (Oral)   Ht 5' 7.75" (1.721 m)   Wt 197 lb (89.4 kg)   SpO2 96%   BMI 30.18 kg/m  BP Readings from Last 3 Encounters:  02/04/17 136/88  01/14/17 (!) 156/90  11/12/16 (!) 138/96   Wt Readings from Last 3 Encounters:  02/04/17 197 lb (89.4 kg)  01/14/17 198 lb 6.4 oz (90 kg)  11/12/16 207 lb (93.9 kg)    Physical Exam  Constitutional: She appears well-developed and well-nourished.  Eyes: Conjunctivae are normal.  Cardiovascular: Normal  rate, regular rhythm, normal heart sounds and normal pulses.   Pulmonary/Chest: Effort normal and breath sounds normal. She has no wheezes. She has no rhonchi. She has no rales.  Neurological: She is alert.  Skin: Skin is warm and dry.  Psychiatric: She has a normal mood and affect. Her speech is normal and behavior is normal. Thought content normal.  Vitals reviewed.      Assessment & Plan:   Problem List Items Addressed This Visit      Cardiovascular and Mediastinum   Hypertension    At goal. Will continue current regimen.           I am having Ms. Touchton maintain her cholecalciferol, multivitamin with minerals, Fish Oil, metoprolol succinate, azithromycin, promethazine-dextromethorphan, amLODipine, and pravastatin.   No orders  of the defined types were placed in this encounter.   Return precautions given.   Risks, benefits, and alternatives of the medications and treatment plan prescribed today were discussed, and patient expressed understanding.   Education regarding symptom management and diagnosis given to patient on AVS.  Continue to follow with [name], FNP for routine health maintenance.   Alicia Ramos and I agreed with plan.   [name], FNP

## 2017-02-04 NOTE — Patient Instructions (Addendum)
Pleasure seeing you  Blood pressure looks great!

## 2017-02-04 NOTE — Progress Notes (Signed)
Pre visit review using our clinic review tool, if applicable. No additional management support is needed unless otherwise documented below in the visit note. 

## 2017-02-10 ENCOUNTER — Ambulatory Visit
Admission: RE | Admit: 2017-02-10 | Discharge: 2017-02-10 | Disposition: A | Discharge status: Home / Self Care | Payer: PPO | Source: Ambulatory Visit | Attending: Oncology | Admitting: Oncology

## 2017-02-10 ENCOUNTER — Encounter: Payer: Self-pay | Admitting: Oncology

## 2017-02-10 ENCOUNTER — Inpatient Hospital Stay: Payer: PPO | Attending: Oncology | Admitting: Oncology

## 2017-02-10 DIAGNOSIS — I700 Atherosclerosis of aorta: Secondary | ICD-10-CM | POA: Diagnosis not present

## 2017-02-10 DIAGNOSIS — Z122 Encounter for screening for malignant neoplasm of respiratory organs: Secondary | ICD-10-CM

## 2017-02-10 DIAGNOSIS — Z87891 Personal history of nicotine dependence: Secondary | ICD-10-CM | POA: Insufficient documentation

## 2017-02-10 DIAGNOSIS — J439 Emphysema, unspecified: Secondary | ICD-10-CM | POA: Diagnosis not present

## 2017-02-10 NOTE — Progress Notes (Signed)
In accordance with CMS guidelines, patient has met eligibility criteria including age, absence of signs or symptoms of lung cancer.  Social History  Substance Use Topics  . Smoking status: Former Smoker    Packs/day: 1.00    Years: 39.00    Quit date: 08/01/2003  . Smokeless tobacco: Never Used  . Alcohol use Yes     Comment: Occasional     A shared decision-making session was conducted prior to the performance of CT scan. This includes one or more decision aids, includes benefits and harms of screening, follow-up diagnostic testing, over-diagnosis, false positive rate, and total radiation exposure.  Counseling on the importance of adherence to annual lung cancer LDCT screening, impact of co-morbidities, and ability or willingness to undergo diagnosis and treatment is imperative for compliance of the program.  Counseling on the importance of continued smoking cessation for former smokers; the importance of smoking cessation for current smokers, and information about tobacco cessation interventions have been given to patient including [place] and [facility] quit [place] programs.  Written order for lung cancer screening with LDCT has been given to the patient and any and all questions have been answered to the best of my abilities.   Yearly follow up will be coordinated by Burgess Estelle, Thoracic Navigator.  Faythe Casa, NP 02/10/2017 1:22 PM

## 2017-02-13 ENCOUNTER — Encounter: Payer: Self-pay | Admitting: *Deleted

## 2017-04-15 DIAGNOSIS — Z1211 Encounter for screening for malignant neoplasm of colon: Secondary | ICD-10-CM | POA: Diagnosis not present

## 2017-04-15 DIAGNOSIS — I10 Essential (primary) hypertension: Secondary | ICD-10-CM | POA: Diagnosis not present

## 2017-05-08 DIAGNOSIS — D2272 Melanocytic nevi of left lower limb, including hip: Secondary | ICD-10-CM | POA: Diagnosis not present

## 2017-05-08 DIAGNOSIS — D225 Melanocytic nevi of trunk: Secondary | ICD-10-CM | POA: Diagnosis not present

## 2017-05-08 DIAGNOSIS — Z85828 Personal history of other malignant neoplasm of skin: Secondary | ICD-10-CM | POA: Diagnosis not present

## 2017-05-08 DIAGNOSIS — D2261 Melanocytic nevi of right upper limb, including shoulder: Secondary | ICD-10-CM | POA: Diagnosis not present

## 2017-05-20 ENCOUNTER — Other Ambulatory Visit: Payer: Self-pay | Admitting: Family Medicine

## 2017-07-03 DIAGNOSIS — H2513 Age-related nuclear cataract, bilateral: Secondary | ICD-10-CM | POA: Diagnosis not present

## 2017-07-15 ENCOUNTER — Ambulatory Visit: Payer: PPO | Admitting: Anesthesiology

## 2017-07-15 ENCOUNTER — Encounter: Payer: Self-pay | Admitting: *Deleted

## 2017-07-15 ENCOUNTER — Ambulatory Visit
Admission: RE | Admit: 2017-07-15 | Discharge: 2017-07-15 | Disposition: A | Discharge status: Home / Self Care | Payer: PPO | Source: Ambulatory Visit | Attending: Unknown Physician Specialty | Admitting: Unknown Physician Specialty

## 2017-07-15 ENCOUNTER — Encounter
Admission: RE | Disposition: A | Discharge status: Home / Self Care | Payer: Self-pay | Source: Ambulatory Visit | Attending: Unknown Physician Specialty

## 2017-07-15 DIAGNOSIS — D123 Benign neoplasm of transverse colon: Secondary | ICD-10-CM | POA: Insufficient documentation

## 2017-07-15 DIAGNOSIS — Z8249 Family history of ischemic heart disease and other diseases of the circulatory system: Secondary | ICD-10-CM | POA: Insufficient documentation

## 2017-07-15 DIAGNOSIS — K279 Peptic ulcer, site unspecified, unspecified as acute or chronic, without hemorrhage or perforation: Secondary | ICD-10-CM | POA: Diagnosis not present

## 2017-07-15 DIAGNOSIS — D124 Benign neoplasm of descending colon: Secondary | ICD-10-CM | POA: Insufficient documentation

## 2017-07-15 DIAGNOSIS — Z803 Family history of malignant neoplasm of breast: Secondary | ICD-10-CM | POA: Insufficient documentation

## 2017-07-15 DIAGNOSIS — Z79899 Other long term (current) drug therapy: Secondary | ICD-10-CM | POA: Insufficient documentation

## 2017-07-15 DIAGNOSIS — K5790 Diverticulosis of intestine, part unspecified, without perforation or abscess without bleeding: Secondary | ICD-10-CM | POA: Diagnosis not present

## 2017-07-15 DIAGNOSIS — D122 Benign neoplasm of ascending colon: Secondary | ICD-10-CM | POA: Diagnosis not present

## 2017-07-15 DIAGNOSIS — I10 Essential (primary) hypertension: Secondary | ICD-10-CM | POA: Insufficient documentation

## 2017-07-15 DIAGNOSIS — Z801 Family history of malignant neoplasm of trachea, bronchus and lung: Secondary | ICD-10-CM | POA: Diagnosis not present

## 2017-07-15 DIAGNOSIS — J449 Chronic obstructive pulmonary disease, unspecified: Secondary | ICD-10-CM | POA: Diagnosis not present

## 2017-07-15 DIAGNOSIS — E785 Hyperlipidemia, unspecified: Secondary | ICD-10-CM | POA: Insufficient documentation

## 2017-07-15 DIAGNOSIS — Z888 Allergy status to other drugs, medicaments and biological substances status: Secondary | ICD-10-CM | POA: Insufficient documentation

## 2017-07-15 DIAGNOSIS — K640 First degree hemorrhoids: Secondary | ICD-10-CM | POA: Diagnosis not present

## 2017-07-15 DIAGNOSIS — Z85828 Personal history of other malignant neoplasm of skin: Secondary | ICD-10-CM | POA: Diagnosis not present

## 2017-07-15 DIAGNOSIS — K5730 Diverticulosis of large intestine without perforation or abscess without bleeding: Secondary | ICD-10-CM | POA: Insufficient documentation

## 2017-07-15 DIAGNOSIS — Z1211 Encounter for screening for malignant neoplasm of colon: Secondary | ICD-10-CM | POA: Diagnosis not present

## 2017-07-15 DIAGNOSIS — K635 Polyp of colon: Secondary | ICD-10-CM | POA: Diagnosis not present

## 2017-07-15 DIAGNOSIS — Z90710 Acquired absence of both cervix and uterus: Secondary | ICD-10-CM | POA: Diagnosis not present

## 2017-07-15 HISTORY — PX: COLONOSCOPY WITH PROPOFOL: SHX5780

## 2017-07-15 SURGERY — COLONOSCOPY WITH PROPOFOL
Anesthesia: General

## 2017-07-15 MED ORDER — FENTANYL CITRATE (PF) 100 MCG/2ML IJ SOLN
INTRAMUSCULAR | Status: AC
  Filled 2017-07-15: qty 2

## 2017-07-15 MED ORDER — PROPOFOL 500 MG/50ML IV EMUL
INTRAVENOUS | Status: DC | PRN
  Administered 2017-07-15: 50 ug/kg/min via INTRAVENOUS

## 2017-07-15 MED ORDER — LIDOCAINE HCL (PF) 2 % IJ SOLN
INTRAMUSCULAR | Status: DC | PRN
  Administered 2017-07-15: 60 mg

## 2017-07-15 MED ORDER — PROPOFOL 10 MG/ML IV BOLUS
INTRAVENOUS | Status: AC
  Filled 2017-07-15: qty 20

## 2017-07-15 MED ORDER — SODIUM CHLORIDE 0.9 % IV SOLN
INTRAVENOUS | Status: DC
  Administered 2017-07-15: 1000 mL via INTRAVENOUS
  Administered 2017-07-15: 11:00:00 via INTRAVENOUS

## 2017-07-15 MED ORDER — SODIUM CHLORIDE 0.9 % IJ SOLN
INTRAMUSCULAR | Status: AC
  Filled 2017-07-15: qty 10

## 2017-07-15 MED ORDER — MIDAZOLAM HCL 5 MG/5ML IJ SOLN
INTRAMUSCULAR | Status: DC | PRN
  Administered 2017-07-15: 2 mg via INTRAVENOUS

## 2017-07-15 MED ORDER — PROPOFOL 10 MG/ML IV BOLUS
INTRAVENOUS | Status: DC | PRN
Start: 2017-07-15 — End: 2017-07-15
  Administered 2017-07-15: 30 mg via INTRAVENOUS

## 2017-07-15 MED ORDER — MIDAZOLAM HCL 2 MG/2ML IJ SOLN
INTRAMUSCULAR | Status: AC
  Filled 2017-07-15: qty 2

## 2017-07-15 MED ORDER — SODIUM CHLORIDE 0.9 % IV SOLN: INTRAVENOUS | Status: DC

## 2017-07-15 MED ORDER — FENTANYL CITRATE (PF) 100 MCG/2ML IJ SOLN
INTRAMUSCULAR | Status: DC | PRN
  Administered 2017-07-15 (×2): 50 ug via INTRAVENOUS

## 2017-07-15 NOTE — Anesthesia Post-op Follow-up Note (Signed)
Anesthesia QCDR form completed.        

## 2017-07-15 NOTE — H&P (Signed)
Primary Care Physician:  [name], FNP Primary Gastroenterologist:  Dr. Vira Agar  Pre-Procedure History & Physical: HPI:  Alicia Ramos is a 66 y.o. female is here for an colonoscopy.   Past Medical History:  Diagnosis Date  . Esophageal ulcer 2000   Required blood transfusion  . HTN (hypertension)   . Hyperlipidemia    Tried statin but had severe myalgia  . Shingles 2001   Left chest  . Skin cancer 2018   left shoulder  . Vaginal delivery    x1    Past Surgical History:  Procedure Laterality Date  . ABDOMINAL HYSTERECTOMY     no cervix seen on pelvic exam; 12/2016, [facility]    Prior to Admission medications   Medication Sig Start Date End Date Taking? Authorizing Provider  amLODipine (NORVASC) 2.5 MG tablet Take 1 tablet (2.5 mg total) by mouth daily. 11/12/16  Yes [name], FNP  metoprolol succinate (TOPROL-XL) 50 MG 24 hr tablet TAKE 1 TABLET BY MOUTH DAILY IMMEDIATELYFOLLOWING A MEAL 05/20/17  Yes [name], FNP  cholecalciferol (VITAMIN D) 1000 UNITS tablet Take 2,000 Units by mouth daily.    [provider]  Multiple Vitamins-Minerals (MULTIVITAMIN WITH MINERALS) tablet Take 1 tablet by mouth daily.    [provider]  Omega-3 Fatty Acids (FISH OIL) 1000 MG CAPS Take by mouth.    [provider]  pravastatin (PRAVACHOL) 40 MG tablet Take 1 tablet (40 mg total) by mouth daily. 11/17/16   [name], FNP    Allergies as of 07/10/2017 - Review Complete 02/16/2017  Allergen Reaction Noted  . Ace inhibitors  03/30/2014  . Losartan  03/30/2014  . Statins  08/01/2011    Family History  Problem Relation Age of Onset  . Hypertension Mother   . Lung cancer Father   . Hypertension Sister   . Breast cancer Paternal Aunt     Social History   Socioeconomic History  . Marital status: Divorced    Spouse name: Not on file  . Number of children: Not on file  . Years of education: Not on file  . Highest  education level: Not on file  Social Needs  . Financial resource strain: Not on file  . Food insecurity - worry: Not on file  . Food insecurity - inability: Not on file  . Transportation needs - medical: Not on file  . Transportation needs - non-medical: Not on file  Occupational History  . Not on file  Tobacco Use  . Smoking status: Former Smoker    Packs/day: 1.00    Years: 39.00    Pack years: 39.00    Last attempt to quit: 08/01/2003    Years since quitting: 13.9  . Smokeless tobacco: Never Used  Substance and Sexual Activity  . Alcohol use: Yes    Comment: Occasional  . Drug use: Not on file  . Sexual activity: Not Currently  Other Topics Concern  . Not on file  Social History Narrative   Regular Exercise -  Yes   Lives in [place] w/son          Review of Systems: See HPI, otherwise negative ROS  Physical Exam: BP 139/72   Pulse 76   Temp (!) 97 F (36.1 C) (Tympanic)   Resp 16   Ht 5\' 8"  (1.727 m)   Wt 90.7 kg (200 lb)   SpO2 98%   BMI 30.41 kg/m  General:   Alert,  pleasant and  cooperative in NAD Head:  Normocephalic and atraumatic. Neck:  Supple; no masses or thyromegaly. Lungs:  Clear throughout to auscultation.    Heart:  Regular rate and rhythm. Abdomen:  Soft, nontender and nondistended. Normal bowel sounds, without guarding, and without rebound.   Neurologic:  Alert and  oriented x4;  grossly normal neurologically.  Impression/Plan: Alicia Ramos is here for an colonoscopy to be performed for screening colonoscopy  Risks, benefits, limitations, and alternatives regarding  colonoscopy have been reviewed with the patient.  Questions have been answered.  All parties agreeable.   Gaylyn Cheers, MD  07/15/2017, 10:33 AM

## 2017-07-15 NOTE — Anesthesia Postprocedure Evaluation (Signed)
Anesthesia Post Note  Patient: Alicia Ramos  Procedure(s) Performed: COLONOSCOPY WITH PROPOFOL (N/A )  Patient location during evaluation: PACU Anesthesia Type: General Level of consciousness: awake Pain management: pain level controlled Vital Signs Assessment: post-procedure vital signs reviewed and stable Respiratory status: spontaneous breathing Cardiovascular status: stable Anesthetic complications: no     Last Vitals:  Vitals:   07/15/17 1120 07/15/17 1130  BP: 119/65 119/66  Pulse: 64 68  Resp: 15 19  Temp:    SpO2: 100% 97%    Last Pain:  Vitals:   07/15/17 1110  TempSrc: Tympanic                 VAN STAVEREN,Jamas Jaquay

## 2017-07-15 NOTE — Op Note (Signed)
[facility] Gastroenterology Patient Name: Alicia Ramos Procedure Date: 07/15/2017 10:32 AM MRN: [id number] Account #: [id number] Date of Birth: 10-Dec-1950 Admit Type: Outpatient Age: 66 Room: [facility] ENDO ROOM 3 Gender: Female Note Status: Finalized Procedure:            Colonoscopy Indications:          Screening for colorectal malignant neoplasm Providers:            Manya Silvas, MD Medicines:            Propofol per Anesthesia Complications:        No immediate complications. Procedure:            Pre-Anesthesia Assessment:                       - After reviewing the risks and benefits, the patient                        was deemed in satisfactory condition to undergo the                        procedure.                       After obtaining informed consent, the colonoscope was                        passed under direct vision. Throughout the procedure,                        the patient's blood pressure, pulse, and oxygen                        saturations were monitored continuously. The                        Colonoscope was introduced through the anus and                        advanced to the the cecum, identified by appendiceal                        orifice and ileocecal valve. The colonoscopy was                        performed without difficulty. The patient tolerated the                        procedure well. The quality of the bowel preparation                        was excellent. Findings:      Two sessile polyps were found in the transverse colon. The polyps were       diminutive in size. These polyps were removed with a jumbo cold forceps.       Resection and retrieval were complete.      A small polyp was found in the distal ascending colon. The polyp was       sessile. The polyp was removed with a hot snare. Resection and retrieval       were complete.      A diminutive  polyp was found in the descending colon. The polyp was   sessile. The polyp was removed with a jumbo cold forceps. Resection and       retrieval were complete.      Multiple small-medium mouthed diverticula were found in the sigmoid       colon.      Internal hemorrhoids were found during endoscopy. The hemorrhoids were       small and Grade I (internal hemorrhoids that do not prolapse).      The exam was otherwise without abnormality. Impression:           - Two diminutive polyps in the transverse colon,                        removed with a jumbo cold forceps. Resected and                        retrieved.                       - One small polyp in the distal ascending colon,                        removed with a hot snare. Resected and retrieved.                       - One diminutive polyp in the descending colon, removed                        with a jumbo cold forceps. Resected and retrieved.                       - Diverticulosis in the sigmoid colon.                       - Internal hemorrhoids.                       - The examination was otherwise normal. Recommendation:       - Await pathology results. Manya Silvas, MD 07/15/2017 11:14:50 AM This report has been signed electronically. Number of Addenda: 0 Note Initiated On: 07/15/2017 10:32 AM Scope Withdrawal Time: 0 hours 13 minutes 3 seconds  Total Procedure Duration: 0 hours 31 minutes 11 seconds       [facility]

## 2017-07-15 NOTE — Transfer of Care (Signed)
Immediate Anesthesia Transfer of Care Note  Patient: [name]  Procedure(s) Performed: COLONOSCOPY WITH PROPOFOL (N/A )  Patient Location: PACU  Anesthesia Type:General  Level of Consciousness: sedated  Airway & Oxygen Therapy: Patient Spontanous Breathing and Patient connected to nasal cannula oxygen  Post-op Assessment: Report given to RN and Post -op Vital signs reviewed and stable  Post vital signs: Reviewed and stable  Last Vitals:  Vitals:   07/15/17 0947  BP: 139/72  Pulse: 76  Resp: 16  Temp: (!) 36.1 C  SpO2: 98%    Last Pain:  Vitals:   07/15/17 0947  TempSrc: Tympanic         Complications: No apparent anesthesia complications

## 2017-07-15 NOTE — Anesthesia Preprocedure Evaluation (Signed)
Anesthesia Evaluation  Patient identified by MRN, date of birth, ID band Patient awake    Reviewed: Allergy & Precautions, NPO status , Patient's Chart, lab work & pertinent test results  Airway Mallampati: II       Dental  (+) Teeth Intact   Pulmonary COPD, former smoker,    breath sounds clear to auscultation       Cardiovascular Exercise Tolerance: Good hypertension, Pt. on medications and Pt. on home beta blockers  Rhythm:Regular Rate:Normal     Neuro/Psych negative neurological ROS     GI/Hepatic Neg liver ROS, PUD,   Endo/Other  negative endocrine ROS  Renal/GU negative Renal ROS  negative genitourinary   Musculoskeletal negative musculoskeletal ROS (+)   Abdominal Normal abdominal exam  (+)   Peds negative pediatric ROS (+)  Hematology negative hematology ROS (+)   Anesthesia Other Findings   Reproductive/Obstetrics                             Anesthesia Physical Anesthesia Plan  ASA: II  Anesthesia Plan: General   Post-op Pain Management:    Induction: Intravenous  PONV Risk Score and Plan:   Airway Management Planned: Natural Airway and Nasal Cannula  Additional Equipment:   Intra-op Plan:   Post-operative Plan:   Informed Consent: I have reviewed the patients History and Physical, chart, labs and discussed the procedure including the risks, benefits and alternatives for the proposed anesthesia with the patient or authorized representative who has indicated his/her understanding and acceptance.     Plan Discussed with: CRNA  Anesthesia Plan Comments:         Anesthesia Quick Evaluation

## 2017-07-16 ENCOUNTER — Encounter: Payer: Self-pay | Admitting: Unknown Physician Specialty

## 2017-07-17 LAB — SURGICAL PATHOLOGY

## 2017-08-13 ENCOUNTER — Other Ambulatory Visit: Payer: Self-pay | Admitting: Family

## 2017-08-13 DIAGNOSIS — E785 Hyperlipidemia, unspecified: Secondary | ICD-10-CM

## 2017-09-07 ENCOUNTER — Other Ambulatory Visit: Payer: Self-pay | Admitting: Family

## 2017-09-07 DIAGNOSIS — Z1231 Encounter for screening mammogram for malignant neoplasm of breast: Secondary | ICD-10-CM

## 2017-10-14 ENCOUNTER — Ambulatory Visit
Admission: RE | Admit: 2017-10-14 | Discharge: 2017-10-14 | Disposition: A | Discharge status: Home / Self Care | Payer: PPO | Source: Ambulatory Visit | Attending: Family | Admitting: Family

## 2017-10-14 DIAGNOSIS — Z1231 Encounter for screening mammogram for malignant neoplasm of breast: Secondary | ICD-10-CM | POA: Diagnosis not present

## 2017-10-16 ENCOUNTER — Encounter: Payer: Self-pay | Admitting: *Deleted

## 2017-10-30 ENCOUNTER — Ambulatory Visit (INDEPENDENT_AMBULATORY_CARE_PROVIDER_SITE_OTHER): Payer: PPO

## 2017-10-30 VITALS — BP 140/82 | HR 72 | Temp 97.8°F | Resp 14 | Ht 67.5 in | Wt 204.8 lb

## 2017-10-30 DIAGNOSIS — Z23 Encounter for immunization: Secondary | ICD-10-CM | POA: Diagnosis not present

## 2017-10-30 DIAGNOSIS — Z0000 Encounter for general adult medical examination without abnormal findings: Secondary | ICD-10-CM

## 2017-10-30 NOTE — Patient Instructions (Addendum)
  Alicia Ramos , Thank you for taking time to come for your Medicare Wellness Visit. I appreciate your ongoing commitment to your health goals. Please review the following plan we discussed and let me know if I can assist you in the future.   Follow up as needed.    Bring a copy of your Henry and/or Living Will to be scanned into chart.  Have a great day!  These are the goals we discussed: Goals    . Increase physical activity     Walk for exercise       This is a list of the screening recommended for you and due dates:  Health Maintenance  Topic Date Due  . Tetanus Vaccine  12/24/1968  . Flu Shot  02/25/2018  . Mammogram  10/15/2019  . Colon Cancer Screening  07/16/2027  . DEXA scan (bone density measurement)  Completed  .  Hepatitis C: One time screening is recommended by Center for Disease Control  (CDC) for  adults born from [birth year] through 1965.   Completed  . Pneumonia vaccines  Completed

## 2017-10-30 NOTE — Progress Notes (Addendum)
Subjective:   Alicia Ramos is a 67 y.o. female who presents for Medicare Annual (Subsequent) preventive examination.  Review of Systems:  No ROS.  Medicare Wellness Visit. Additional risk factors are reflected in the social history.  Cardiac Risk Factors include: advanced age (>58men, >65 women)     Objective:     Vitals: BP 140/82 (BP Location: Left Arm, Patient Position: Sitting, Cuff Size: Normal)   Pulse 72   Temp 97.8 F (36.6 C) (Oral)   Resp 14   Ht 5' 7.5" (1.715 m)   Wt 204 lb 12.8 oz (92.9 kg)   SpO2 97%   BMI 31.60 kg/m   Body mass index is 31.6 kg/m.  No flowsheet data found.  Tobacco Social History   Tobacco Use  Smoking Status Former Smoker  . Packs/day: 1.00  . Years: 39.00  . Pack years: 39.00  . Last attempt to quit: 08/01/2003  . Years since quitting: 14.2  Smokeless Tobacco Never Used     Counseling given: Not Answered   Clinical Intake:  Pre-visit preparation completed: Yes  Pain : No/denies pain     Nutritional Status: BMI > 30  Obese Diabetes: No  How often do you need to have someone help you when you read instructions, pamphlets, or other written materials from your doctor or pharmacy?: 1 - Never  Interpreter Needed?: No     Past Medical History:  Diagnosis Date  . Esophageal ulcer 2000   Required blood transfusion  . HTN (hypertension)   . Hyperlipidemia    Tried statin but had severe myalgia  . Shingles 2001   Left chest  . Skin cancer 2018   left shoulder  . Vaginal delivery    x1   Past Surgical History:  Procedure Laterality Date  . ABDOMINAL HYSTERECTOMY     no cervix seen on pelvic exam; 12/2016, [facility]  . COLONOSCOPY WITH PROPOFOL N/A 07/15/2017   Procedure: COLONOSCOPY WITH PROPOFOL;  Surgeon: Manya Silvas, MD;  Location: [facility] ENDOSCOPY;  Service: Endoscopy;  Laterality: N/A;   Family History  Problem Relation Age of Onset  . Hypertension Mother   . Lung cancer Father   . Throat cancer Father    . Hypertension Sister   . Breast cancer Paternal Aunt    Social History   Socioeconomic History  . Marital status: Divorced    Spouse name: Not on file  . Number of children: Not on file  . Years of education: Not on file  . Highest education level: Not on file  Occupational History  . Not on file  Social Needs  . Financial resource strain: Not hard at all  . Food insecurity:    Worry: Never true    Inability: Never true  . Transportation needs:    Medical: No    Non-medical: No  Tobacco Use  . Smoking status: Former Smoker    Packs/day: 1.00    Years: 39.00    Pack years: 39.00    Last attempt to quit: 08/01/2003    Years since quitting: 14.2  . Smokeless tobacco: Never Used  Substance and Sexual Activity  . Alcohol use: Yes    Comment: Occasional  . Drug use: Not on file  . Sexual activity: Not Currently  Lifestyle  . Physical activity:    Days per week: 5 days    Minutes per session: 60 min  . Stress: Not on file  Relationships  . Social connections:    Talks  on phone: Not on file    Gets together: Not on file    Attends religious service: Not on file    Active member of club or organization: Not on file    Attends meetings of clubs or organizations: Not on file    Relationship status: Not on file  Other Topics Concern  . Not on file  Social History Narrative   Regular Exercise -  Yes   Lives in [place] w/son          Outpatient Encounter Medications as of 10/30/2017  Medication Sig  . amLODipine (NORVASC) 2.5 MG tablet Take 1 tablet (2.5 mg total) by mouth daily.  . cholecalciferol (VITAMIN D) 1000 UNITS tablet Take 2,000 Units by mouth daily.  . metoprolol succinate (TOPROL-XL) 50 MG 24 hr tablet TAKE 1 TABLET BY MOUTH DAILY IMMEDIATELYFOLLOWING A MEAL  . pravastatin (PRAVACHOL) 40 MG tablet TAKE ONE (1) TABLET EACH DAY  . [DISCONTINUED] Multiple Vitamins-Minerals (MULTIVITAMIN WITH MINERALS) tablet Take 1 tablet by mouth daily.  . [DISCONTINUED]  Omega-3 Fatty Acids (FISH OIL) 1000 MG CAPS Take by mouth.   No facility-administered encounter medications on file as of 10/30/2017.     Activities of Daily Living In your present state of health, do you have any difficulty performing the following activities: 10/30/2017  Hearing? N  Vision? N  Difficulty concentrating or making decisions? N  Walking or climbing stairs? N  Dressing or bathing? N  Doing errands, shopping? N  Preparing Food and eating ? N  Using the Toilet? N  In the past six months, have you accidently leaked urine? N  Do you have problems with loss of bowel control? N  Managing your Medications? N  Managing your Finances? N  Housekeeping or managing your Housekeeping? N  Some recent data might be hidden    Patient Care Team: [name], FNP as PCP - General (Family Medicine)    Assessment:   This is a routine wellness examination for Alicia Ramos.  The goal of the wellness visit is to assist the patient how to close the gaps in care and create a preventative care plan for the patient.   The roster of all physicians providing medical care to patient is listed in the Snapshot section of the chart.  Osteoporosis risk reviewed.    Safety issues reviewed; Smoke and carbon monoxide detectors in the home. No firearms in the home. Wears seatbelts when driving or riding with others. No violence in the home.  They do not have excessive sun exposure.  Discussed the need for sun protection: hats, long sleeves and the use of sunscreen if there is significant sun exposure.  Patient is alert, normal appearance, oriented to person/place/and time.  Correctly identified the president of the [place] and recalls of 3/3 words. Performs simple calculations and can read correct time from watch face. Displays appropriate judgement.  No new identified risk were noted.  No failures at ADL's or IADL's.   BMI- discussed the importance of a healthy diet, water intake and the benefits of  aerobic exercise. She has a healthy diet, adequate water intake and exercise regimen 5 days weekly for 1 hour.  24 hour diet recall: Low carb diet.  Dental- every 6 months.  Dr. Carman Ching.  Eye- Visual acuity not assessed per patient preference since they have regular follow up with the ophthalmologist.  Wears corrective lenses.  Sleep patterns- Sleeps 8 hours at night.  Wakes feeling rested.   Pneumovax 23 vaccine administered  R deltoid, tolerated well.   Educational material provided.    TDAP vaccine deferred per patient preference.  Follow up with insurance.  Educational material provided.  Patient Concerns: None at this time. Follow up with PCP as needed.  Exercise Activities and Dietary recommendations Current Exercise Habits: Structured exercise class, Type of exercise: calisthenics;yoga;stretching(zumba, water aerobics), Time (Minutes): 60, Frequency (Times/Week): 5, Weekly Exercise (Minutes/Week): 300, Intensity: Mild  Goals    . Increase physical activity     Walk for exercise       Fall Risk Fall Risk  10/30/2017 11/12/2016 09/27/2015 03/04/2013  Falls in the past year? No No No Yes  Number falls in past yr: - - - 1  Injury with Fall? - - - No  Comment - - - bruising, fell after hiking   Depression Screen PHQ 2/9 Scores 10/30/2017 11/12/2016 09/27/2015 03/04/2013  PHQ - 2 Score 0 0 0 0     Cognitive Function MMSE - Mini Mental State Exam 10/30/2017  Orientation to time 5  Orientation to Place 5  Registration 3  Attention/ Calculation 5  Recall 3  Language- name 2 objects 2  Language- repeat 1  Language- follow 3 step command 3  Language- read & follow direction 1  Write a sentence 1  Copy design 1  Total score 30        Immunization History  Administered Date(s) Administered  . Influenza Split 08/15/2011  . Pneumococcal Conjugate-13 03/28/2015  . Pneumococcal Polysaccharide-23 10/30/2017    Screening Tests Health Maintenance  Topic Date Due  .  TETANUS/TDAP  12/24/1968  . INFLUENZA VACCINE  02/25/2018  . MAMMOGRAM  10/15/2019  . COLONOSCOPY  07/16/2027  . DEXA SCAN  Completed  . Hepatitis C Screening  Completed  . PNA vac Low Risk Adult  Completed      Plan:    End of life planning; Advance aging; Advanced directives discussed. Copy of current HCPOA/Living Will requested.    I have personally reviewed and noted the following in the patient's chart:   . Medical and social history . Use of alcohol, tobacco or illicit drugs  . Current medications and supplements . Functional ability and status . Nutritional status . Physical activity . Advanced directives . List of other physicians . Hospitalizations, surgeries, and ER visits in previous 12 months . Vitals . Screenings to include cognitive, depression, and falls . Referrals and appointments  In addition, I have reviewed and discussed with patient certain preventive protocols, quality metrics, and best practice recommendations. A written personalized care plan for preventive services as well as general preventive health recommendations were provided to patient.     Varney Biles, LPN  7/0/6237  Agree with plan. [name], NP

## 2017-11-25 ENCOUNTER — Other Ambulatory Visit: Payer: Self-pay | Admitting: Family

## 2017-11-25 DIAGNOSIS — I10 Essential (primary) hypertension: Secondary | ICD-10-CM

## 2017-12-31 DIAGNOSIS — D2271 Melanocytic nevi of right lower limb, including hip: Secondary | ICD-10-CM | POA: Diagnosis not present

## 2017-12-31 DIAGNOSIS — D2261 Melanocytic nevi of right upper limb, including shoulder: Secondary | ICD-10-CM | POA: Diagnosis not present

## 2017-12-31 DIAGNOSIS — D2262 Melanocytic nevi of left upper limb, including shoulder: Secondary | ICD-10-CM | POA: Diagnosis not present

## 2017-12-31 DIAGNOSIS — Z85828 Personal history of other malignant neoplasm of skin: Secondary | ICD-10-CM | POA: Diagnosis not present

## 2017-12-31 DIAGNOSIS — D2272 Melanocytic nevi of left lower limb, including hip: Secondary | ICD-10-CM | POA: Diagnosis not present

## 2017-12-31 DIAGNOSIS — Z08 Encounter for follow-up examination after completed treatment for malignant neoplasm: Secondary | ICD-10-CM | POA: Diagnosis not present

## 2017-12-31 DIAGNOSIS — D225 Melanocytic nevi of trunk: Secondary | ICD-10-CM | POA: Diagnosis not present

## 2018-01-20 ENCOUNTER — Ambulatory Visit (INDEPENDENT_AMBULATORY_CARE_PROVIDER_SITE_OTHER): Payer: PPO | Admitting: Family

## 2018-01-20 ENCOUNTER — Telehealth: Payer: Self-pay | Admitting: Family

## 2018-01-20 ENCOUNTER — Encounter: Payer: Self-pay | Admitting: Family

## 2018-01-20 VITALS — BP 138/92 | HR 72 | Temp 97.7°F | Ht 68.0 in | Wt 207.5 lb

## 2018-01-20 DIAGNOSIS — I10 Essential (primary) hypertension: Secondary | ICD-10-CM | POA: Diagnosis not present

## 2018-01-20 DIAGNOSIS — Z0000 Encounter for general adult medical examination without abnormal findings: Secondary | ICD-10-CM

## 2018-01-20 LAB — COMPREHENSIVE METABOLIC PANEL
ALBUMIN: 4.6 g/dL (ref 3.5–5.2)
ALT: 17 U/L (ref 0–35)
AST: 16 U/L (ref 0–37)
Alkaline Phosphatase: 62 U/L (ref 39–117)
BUN: 15 mg/dL (ref 6–23)
CALCIUM: 9.6 mg/dL (ref 8.4–10.5)
CHLORIDE: 102 meq/L (ref 96–112)
CO2: 28 meq/L (ref 19–32)
CREATININE: 0.8 mg/dL (ref 0.40–1.20)
GFR: 75.8 mL/min (ref >60.00)
Glucose, Bld: 108 mg/dL — ABNORMAL HIGH (ref 70–99)
POTASSIUM: 4.9 meq/L (ref 3.5–5.1)
Sodium: 137 mEq/L (ref 135–145)
Total Bilirubin: 0.4 mg/dL (ref 0.2–1.2)
Total Protein: 7.4 g/dL (ref 6.0–8.3)

## 2018-01-20 LAB — LIPID PANEL
CHOL/HDL RATIO: 4
CHOLESTEROL: 178 mg/dL (ref 0–200)
HDL: 48.8 mg/dL (ref >39.00)
NonHDL: 129.18
TRIGLYCERIDES: 216 mg/dL — AB (ref 0.0–149.0)
VLDL: 43.2 mg/dL — AB (ref 0.0–40.0)

## 2018-01-20 LAB — LDL CHOLESTEROL, DIRECT: Direct LDL: 101 mg/dL

## 2018-01-20 LAB — HEMOGLOBIN A1C: HEMOGLOBIN A1C: 6.4 % (ref 4.6–6.5)

## 2018-01-20 MED ORDER — AMLODIPINE BESYLATE 5 MG PO TABS: 5.0000 mg | ORAL_TABLET | Freq: Every day | ORAL | 1 refills | Status: DC

## 2018-01-20 NOTE — Patient Instructions (Addendum)
Need shingrex vaccine for shingles  Also need tetanus vaccine.   Labs today  Please call the office if you do not hear from me regarding CT chest for smoking. You may be done if has been 15 years.   Today we discussed referrals, orders. Bone Density   I have placed these orders in the system for you.  Please be sure to give [place] a call if you have not heard from our office regarding scheduling a test or regarding referral in a timely manner.  It is very important that you let me know as soon as possible.       Health Maintenance for Postmenopausal Women Menopause is a normal process in which your reproductive ability comes to an end. This process happens gradually over a span of months to years, usually between the ages of 16 and 78. Menopause is complete when you have missed 12 consecutive menstrual periods. It is important to talk with your health care provider about some of the most common conditions that affect postmenopausal women, such as heart disease, cancer, and bone loss (osteoporosis). Adopting a healthy lifestyle and getting preventive care can help to promote your health and wellness. Those actions can also lower your chances of developing some of these common conditions. What should I know about menopause? During menopause, you may experience a number of symptoms, such as:  Moderate-to-severe hot flashes.  Night sweats.  Decrease in sex drive.  Mood swings.  Headaches.  Tiredness.  Irritability.  Memory problems.  Insomnia.  Choosing to treat or not to treat menopausal changes is an individual decision that you make with your health care provider. What should I know about hormone replacement therapy and supplements? Hormone therapy products are effective for treating symptoms that are associated with menopause, such as hot flashes and night sweats. Hormone replacement carries certain risks, especially as you become older. If you are thinking about using estrogen  or estrogen with progestin treatments, discuss the benefits and risks with your health care provider. What should I know about heart disease and stroke? Heart disease, heart attack, and stroke become more likely as you age. This may be due, in part, to the hormonal changes that your body experiences during menopause. These can affect how your body processes dietary fats, triglycerides, and cholesterol. Heart attack and stroke are both medical emergencies. There are many things that you can do to help prevent heart disease and stroke:  Have your blood pressure checked at least every 1-2 years. High blood pressure causes heart disease and increases the risk of stroke.  If you are 24-91 years old, ask your health care provider if you should take aspirin to prevent a heart attack or a stroke.  Do not use any tobacco products, including cigarettes, chewing tobacco, or electronic cigarettes. If you need help quitting, ask your health care provider.  It is important to eat a healthy diet and maintain a healthy weight. ? Be sure to include plenty of vegetables, fruits, low-fat dairy products, and lean protein. ? Avoid eating foods that are high in solid fats, added sugars, or salt (sodium).  Get regular exercise. This is one of the most important things that you can do for your health. ? Try to exercise for at least 150 minutes each week. The type of exercise that you do should increase your heart rate and make you sweat. This is known as moderate-intensity exercise. ? Try to do strengthening exercises at least twice each week. Do these in  addition to the moderate-intensity exercise.  Know your numbers.Ask your health care provider to check your cholesterol and your blood glucose. Continue to have your blood tested as directed by your health care provider.  What should I know about cancer screening? There are several types of cancer. Take the following steps to reduce your risk and to catch any cancer  development as early as possible. Breast Cancer  Practice breast self-awareness. ? This means understanding how your breasts normally appear and feel. ? It also means doing regular breast self-exams. Let your health care provider know about any changes, no matter how small.  If you are 40 or older, have a clinician do a breast exam (clinical breast exam or CBE) every year. Depending on your age, family history, and medical history, it may be recommended that you also have a yearly breast X-ray (mammogram).  If you have a family history of breast cancer, talk with your health care provider about genetic screening.  If you are at high risk for breast cancer, talk with your health care provider about having an MRI and a mammogram every year.  Breast cancer (BRCA) gene test is recommended for women who have family members with BRCA-related cancers. Results of the assessment will determine the need for genetic counseling and BRCA1 and for BRCA2 testing. BRCA-related cancers include these types: ? Breast. This occurs in males or females. ? Ovarian. ? Tubal. This may also be called fallopian tube cancer. ? Cancer of the abdominal or pelvic lining (peritoneal cancer). ? Prostate. ? Pancreatic.  Cervical, Uterine, and Ovarian Cancer Your health care provider may recommend that you be screened regularly for cancer of the pelvic organs. These include your ovaries, uterus, and vagina. This screening involves a pelvic exam, which includes checking for microscopic changes to the surface of your cervix (Pap test).  For women ages 21-65, health care providers may recommend a pelvic exam and a Pap test every three years. For women ages 30-65, they may recommend the Pap test and pelvic exam, combined with testing for human papilloma virus (HPV), every five years. Some types of HPV increase your risk of cervical cancer. Testing for HPV may also be done on women of any age who have unclear Pap test  results.  Other health care providers may not recommend any screening for nonpregnant women who are considered low risk for pelvic cancer and have no symptoms. Ask your health care provider if a screening pelvic exam is right for you.  If you have had past treatment for cervical cancer or a condition that could lead to cancer, you need Pap tests and screening for cancer for at least 20 years after your treatment. If Pap tests have been discontinued for you, your risk factors (such as having a new sexual partner) need to be reassessed to determine if you should start having screenings again. Some women have medical problems that increase the chance of getting cervical cancer. In these cases, your health care provider may recommend that you have screening and Pap tests more often.  If you have a family history of uterine cancer or ovarian cancer, talk with your health care provider about genetic screening.  If you have vaginal bleeding after reaching menopause, tell your health care provider.  There are currently no reliable tests available to screen for ovarian cancer.  Lung Cancer Lung cancer screening is recommended for adults 55-80 years old who are at high risk for lung cancer because of a history of smoking. A   yearly low-dose CT scan of the lungs is recommended if you:  Currently smoke.  Have a history of at least 30 pack-years of smoking and you currently smoke or have quit within the past 15 years. A pack-year is smoking an average of one pack of cigarettes per day for one year.  Yearly screening should:  Continue until it has been 15 years since you quit.  Stop if you develop a health problem that would prevent you from having lung cancer treatment.  Colorectal Cancer  This type of cancer can be detected and can often be prevented.  Routine colorectal cancer screening usually begins at age 50 and continues through age 75.  If you have risk factors for colon cancer, your health  care provider may recommend that you be screened at an earlier age.  If you have a family history of colorectal cancer, talk with your health care provider about genetic screening.  Your health care provider may also recommend using home test kits to check for hidden blood in your stool.  A small camera at the end of a tube can be used to examine your colon directly (sigmoidoscopy or colonoscopy). This is done to check for the earliest forms of colorectal cancer.  Direct examination of the colon should be repeated every 5-10 years until age 75. However, if early forms of precancerous polyps or small growths are found or if you have a family history or genetic risk for colorectal cancer, you may need to be screened more often.  Skin Cancer  Check your skin from head to toe regularly.  Monitor any moles. Be sure to tell your health care provider: ? About any new moles or changes in moles, especially if there is a change in a mole's shape or color. ? If you have a mole that is larger than the size of a pencil eraser.  If any of your family members has a history of skin cancer, especially at a young age, talk with your health care provider about genetic screening.  Always use sunscreen. Apply sunscreen liberally and repeatedly throughout the day.  Whenever you are outside, protect yourself by wearing long sleeves, pants, a wide-brimmed hat, and sunglasses.  What should I know about osteoporosis? Osteoporosis is a condition in which bone destruction happens more quickly than new bone creation. After menopause, you may be at an increased risk for osteoporosis. To help prevent osteoporosis or the bone fractures that can happen because of osteoporosis, the following is recommended:  If you are 19-50 years old, get at least 1,000 mg of calcium and at least 600 mg of vitamin D per day.  If you are older than age 50 but younger than age 70, get at least 1,200 mg of calcium and at least 600 mg of  vitamin D per day.  If you are older than age 70, get at least 1,200 mg of calcium and at least 800 mg of vitamin D per day.  Smoking and excessive alcohol intake increase the risk of osteoporosis. Eat foods that are rich in calcium and vitamin D, and do weight-bearing exercises several times each week as directed by your health care provider. What should I know about how menopause affects my mental health? Depression may occur at any age, but it is more common as you become older. Common symptoms of depression include:  Low or sad mood.  Changes in sleep patterns.  Changes in appetite or eating patterns.  Feeling an overall lack of motivation or   enjoyment of activities that you previously enjoyed.  Frequent crying spells.  Talk with your health care provider if you think that you are experiencing depression. What should I know about immunizations? It is important that you get and maintain your immunizations. These include:  Tetanus, diphtheria, and pertussis (Tdap) booster vaccine.  Influenza every year before the flu season begins.  Pneumonia vaccine.  Shingles vaccine.  Your health care provider may also recommend other immunizations. This information is not intended to replace advice given to you by your health care provider. Make sure you discuss any questions you have with your health care provider. Document Released: 09/05/2005 Document Revised: 02/01/2016 Document Reviewed: 04/17/2015 Elsevier Interactive Patient Education  2018 [employer].

## 2018-01-20 NOTE — Progress Notes (Signed)
Subjective:    Patient ID: Alicia Ramos, female    DOB: October 07, 1950, 67 y.o.   MRN: [id number]  CC: Alicia Ramos is a 67 y.o. female who presents today for physical exam.    HPI: Feels well today. No complaints.  HTN- at home gets 148/88.  Denies exertional chest pain or pressure, numbness or tingling radiating to left arm or jaw, palpitations, dizziness, frequent headaches, changes in vision, or shortness of breath.    HLD - compliant with medication.      Colorectal Cancer Screening: UTD  Breast Cancer Screening: Mammogram UTD Cervical Cancer Screening: UTD ; h/o hysterectomy ; no cervix seen 2018. Abnormal pap 2013 with HPV positive Bone Health screening/DEXA for 65+: Due Lung Cancer Screening: Due       Tetanus - due         Labs: Screening labs today. Exercise: Gets regular exercise.  Alcohol use: occaisonal Smoking/tobacco use: former smoker.  Regular dental exams:UTD Wears seat belt: Yes. Skin : follows with dermatology HISTORY:  Past Medical History:  Diagnosis Date  . Esophageal ulcer 2000   Required blood transfusion  . HTN (hypertension)   . Hyperlipidemia    Tried statin but had severe myalgia  . Shingles 2001   Left chest  . Skin cancer 2018   left shoulder  . Vaginal delivery    x1    Past Surgical History:  Procedure Laterality Date  . ABDOMINAL HYSTERECTOMY     no cervix seen on pelvic exam; 12/2016, [facility]  . COLONOSCOPY WITH PROPOFOL N/A 07/15/2017   Procedure: COLONOSCOPY WITH PROPOFOL;  Surgeon: Manya Silvas, MD;  Location: [facility] ENDOSCOPY;  Service: Endoscopy;  Laterality: N/A;   Family History  Problem Relation Age of Onset  . Hypertension Mother   . Lung cancer Father   . Throat cancer Father   . Hypertension Sister   . Breast cancer Paternal Aunt       ALLERGIES: Ace inhibitors; Losartan; and Statins  Current Outpatient Medications on File Prior to Visit  Medication Sig Dispense Refill  . cholecalciferol (VITAMIN D)  1000 UNITS tablet Take 2,000 Units by mouth daily.    . metoprolol succinate (TOPROL-XL) 50 MG 24 hr tablet TAKE ONE TABLET BY MOUTH EVERY DAY IMMEDIATELY FOLLOWING A MEAL. 90 tablet 1  . pravastatin (PRAVACHOL) 40 MG tablet TAKE ONE (1) TABLET EACH DAY 90 tablet 2   No current facility-administered medications on file prior to visit.     Social History   Tobacco Use  . Smoking status: Former Smoker    Packs/day: 1.00    Years: 39.00    Pack years: 39.00    Last attempt to quit: 08/01/2003    Years since quitting: 14.4  . Smokeless tobacco: Never Used  Substance Use Topics  . Alcohol use: Yes    Comment: Occasional  . Drug use: Not on file    Review of Systems  Constitutional: Negative for chills, fever and unexpected weight change.  HENT: Negative for congestion.   Respiratory: Negative for cough.   Cardiovascular: Negative for chest pain, palpitations and leg swelling.  Gastrointestinal: Negative for nausea and vomiting.  Genitourinary: Negative for pelvic pain and vaginal bleeding.  Musculoskeletal: Negative for arthralgias and myalgias.  Skin: Negative for rash.  Neurological: Negative for headaches.  Hematological: Negative for adenopathy.  Psychiatric/Behavioral: Negative for confusion.      Objective:    BP (!) 138/92 (BP Location: Left Arm, Patient Position: Sitting, Cuff Size:  Normal)   Pulse 72   Temp 97.7 F (36.5 C) (Oral)   Ht 5\' 8"  (1.727 m)   Wt 207 lb 8 oz (94.1 kg)   SpO2 98%   BMI 31.55 kg/m   BP Readings from Last 3 Encounters:  01/20/18 (!) 138/92  10/30/17 140/82  07/15/17 119/66   Wt Readings from Last 3 Encounters:  01/20/18 207 lb 8 oz (94.1 kg)  10/30/17 204 lb 12.8 oz (92.9 kg)  07/15/17 200 lb (90.7 kg)    Physical Exam  Constitutional: She appears well-developed and well-nourished.  Eyes: Conjunctivae are normal.  Neck: No thyroid mass and no thyromegaly present.  Cardiovascular: Normal rate, regular rhythm, normal heart sounds  and normal pulses.  Pulmonary/Chest: Effort normal and breath sounds normal. She has no wheezes. She has no rhonchi. She has no rales. Right breast exhibits no inverted nipple, no mass, no nipple discharge, no skin change and no tenderness. Left breast exhibits no inverted nipple, no mass, no nipple discharge, no skin change and no tenderness. Breasts are symmetrical.  CBE performed.   Lymphadenopathy:       Head (right side): No submental, no submandibular, no tonsillar, no preauricular, no posterior auricular and no occipital adenopathy present.       Head (left side): No submental, no submandibular, no tonsillar, no preauricular, no posterior auricular and no occipital adenopathy present.    She has no cervical adenopathy.       Right cervical: No superficial cervical, no deep cervical and no posterior cervical adenopathy present.      Left cervical: No superficial cervical, no deep cervical and no posterior cervical adenopathy present.    She has no axillary adenopathy.  Neurological: She is alert.  Skin: Skin is warm and dry.  Psychiatric: She has a normal mood and affect. Her speech is normal and behavior is normal. Thought content normal.  Vitals reviewed.      Assessment & Plan:   Problem List Items Addressed This Visit      Cardiovascular and Mediastinum   Hypertension    Blood pressure is not particularly elevated today however at home readings are consistently high 140s.  Increase amlodipine to 5mg . Blood pressure log at home.  Patient will let me know how she is doing      Relevant Medications   amLODipine (NORVASC) 5 MG tablet     Other   Routine general medical examination at a health care facility - Primary    She is also a former smoker and has been doing the CT chest lung cancer screening program.  Based on her quit date, I am not sure as to whether or not she is aged out of this.  I have [place]. Patient will contact the office if she is not heard back  from me regarding this.CBE performed. Pap is up-to-date.  In this case, patient does not have a cervix however she has a history of HPV positive Pap in 2013.  Discussed with her that I would advise continue to do Pap smears every 3 years at least another couple of times.  Patient is in agreement with this.  Declines pelvic exam in the absence of complaints today and that her Pap smear is up-to-date.  Advised tetanus, Shingrix at local pharmacy.      Relevant Orders   DG Bone Density   Comprehensive metabolic panel   Hemoglobin A1c   Lipid panel       I have changed  Kathrynne L. Tavenner's amLODipine. I am also having her maintain her cholecalciferol, pravastatin, and metoprolol succinate.   Meds ordered this encounter  Medications  . amLODipine (NORVASC) 5 MG tablet    Sig: Take 1 tablet (5 mg total) by mouth daily.    Dispense:  90 tablet    Refill:  1    Order Specific Question:   Supervising Provider    Answer:   Crecencio Mc [[id number]]    Return precautions given.   Risks, benefits, and alternatives of the medications and treatment plan prescribed today were discussed, and patient expressed understanding.   Education regarding symptom management and diagnosis given to patient on AVS.   Continue to follow with [name], FNP for routine health maintenance.   Alicia Ramos and I agreed with plan.   [name], FNP

## 2018-01-20 NOTE — Assessment & Plan Note (Addendum)
She is also a former smoker and has been doing the CT chest lung cancer screening program.  Based on her quit date, I am not sure as to whether or not she is aged out of this.  I have [place]. Patient will contact the office if she is not heard back from me regarding this.CBE performed. Pap is up-to-date.  In this case, patient does not have a cervix however she has a history of HPV positive Pap in 2013.  Discussed with her that I would advise continue to do Pap smears every 3 years at least another couple of times.  Patient is in agreement with this.  Declines pelvic exam in the absence of complaints today and that her Pap smear is up-to-date.  Advised tetanus, Shingrix at local pharmacy.

## 2018-01-20 NOTE — Assessment & Plan Note (Signed)
Blood pressure is not particularly elevated today however at home readings are consistently high 140s.  Increase amlodipine to 5mg . Blood pressure log at home.  Patient will let me know how she is doing

## 2018-01-20 NOTE — Telephone Encounter (Signed)
Call pt  Inform her that she is due for ct chest again for smoking history  They should be calling her to schedule in  A couple of weeks  Of course, let me know if she doesn't hear from them

## 2018-02-02 ENCOUNTER — Telehealth: Payer: Self-pay | Admitting: Nurse Practitioner

## 2018-02-04 NOTE — Telephone Encounter (Signed)
Spoke with patient she has received call from [facility] , she will schedule as soon as she gets back from vacation.

## 2018-02-11 ENCOUNTER — Telehealth: Payer: Self-pay | Admitting: Nurse Practitioner

## 2018-02-26 ENCOUNTER — Telehealth: Payer: Self-pay | Admitting: *Deleted

## 2018-02-26 NOTE — Telephone Encounter (Signed)
Left message for patient to notify them that it is time to schedule annual low dose lung cancer screening CT scan. Instructed patient to call back to verify information prior to the scan being scheduled.  

## 2018-03-01 ENCOUNTER — Telehealth: Payer: Self-pay | Admitting: Nurse Practitioner

## 2018-03-01 ENCOUNTER — Telehealth: Payer: Self-pay | Admitting: *Deleted

## 2018-03-01 NOTE — Telephone Encounter (Signed)
Left message for patient to notify them that it is time to schedule annual low dose lung cancer screening CT scan. Instructed patient to call back to verify information prior to the scan being scheduled.  

## 2018-03-11 ENCOUNTER — Encounter: Payer: Self-pay | Admitting: *Deleted

## 2018-04-20 ENCOUNTER — Telehealth: Payer: Self-pay | Admitting: *Deleted

## 2018-04-20 DIAGNOSIS — Z122 Encounter for screening for malignant neoplasm of respiratory organs: Secondary | ICD-10-CM

## 2018-04-20 DIAGNOSIS — Z87891 Personal history of nicotine dependence: Secondary | ICD-10-CM

## 2018-04-20 NOTE — Telephone Encounter (Signed)
Patient has been notified that annual lung cancer screening low dose CT scan is due currently or will be in near future. Confirmed that patient is within the age range of 55-77, and asymptomatic, (no signs or symptoms of lung cancer). Patient denies illness that would prevent curative treatment for lung cancer if found. Verified smoking history, (former, quit 1/4/5, 39 pack year). The shared decision making visit was done 02/10/18. Patient is agreeable for CT scan being scheduled.

## 2018-04-23 ENCOUNTER — Telehealth: Payer: Self-pay | Admitting: Family

## 2018-04-23 ENCOUNTER — Encounter: Payer: Self-pay | Admitting: *Deleted

## 2018-04-23 ENCOUNTER — Ambulatory Visit
Admission: RE | Admit: 2018-04-23 | Discharge: 2018-04-23 | Disposition: A | Discharge status: Home / Self Care | Payer: PPO | Source: Ambulatory Visit | Attending: Oncology | Admitting: Oncology

## 2018-04-23 DIAGNOSIS — I700 Atherosclerosis of aorta: Secondary | ICD-10-CM | POA: Diagnosis not present

## 2018-04-23 DIAGNOSIS — Z122 Encounter for screening for malignant neoplasm of respiratory organs: Secondary | ICD-10-CM | POA: Diagnosis not present

## 2018-04-23 DIAGNOSIS — J438 Other emphysema: Secondary | ICD-10-CM | POA: Insufficient documentation

## 2018-04-23 DIAGNOSIS — J432 Centrilobular emphysema: Secondary | ICD-10-CM | POA: Diagnosis not present

## 2018-04-23 DIAGNOSIS — I2510 Atherosclerotic heart disease of native coronary artery without angina pectoris: Secondary | ICD-10-CM | POA: Insufficient documentation

## 2018-04-23 DIAGNOSIS — Z87891 Personal history of nicotine dependence: Secondary | ICD-10-CM | POA: Diagnosis not present

## 2018-04-23 NOTE — Telephone Encounter (Signed)
  Please mail letter to patient:   Alicia Ramos, Alicia Ramos you are doing well.   I received results from your annual CT lung scan from [facility].  It was noted on the exam that you have coronary artery atherosclerosis, or often referred to as hardening of the arteries.   This can put you at risk for stroke, heart attack.   I wanted you to be aware as Im sure they will mail you a letter as well. It is most important that we continue to follow your cholesterol and you stay on the pravachol.   Any strong family history or concerns, we can also discuss cardiology evaluation.   Certainly let me know if that is a step you would like to take.   Best,   [name], NP

## 2018-04-26 ENCOUNTER — Encounter: Payer: Self-pay | Admitting: Family

## 2018-04-26 NOTE — Telephone Encounter (Signed)
Please mail letter to patient:   Alicia Ramos, Alicia Ramos you are doing well.   I received results from your annual CT lung scan from [facility].  It was noted on the exam that you have coronary artery atherosclerosis, or often referred to as hardening of the arteries.   This can put you at risk for stroke, heart attack.   Please ensure you have a follow up appointment with me in the near future so we can discuss lifestyle changes, if there is a need for cardiology evaluation. I do not see in your chart that you have been to cardiology for stress test etc.   Also, we will continue the pravachol.   Look forward to seeing you!   Best,   [name], NP

## 2018-04-27 NOTE — Telephone Encounter (Signed)
Mailed letter to patient

## 2018-05-08 ENCOUNTER — Other Ambulatory Visit: Payer: Self-pay | Admitting: Family

## 2018-05-08 DIAGNOSIS — E785 Hyperlipidemia, unspecified: Secondary | ICD-10-CM

## 2018-05-20 ENCOUNTER — Other Ambulatory Visit: Payer: Self-pay | Admitting: Family

## 2018-07-13 IMAGING — MG MM DIGITAL SCREENING BILAT W/ CAD
5 series · 5 of 5 positions shown · non-contrast
Comparison: Previous exam(s).

CLINICAL DATA: Screening.

EXAM:
DIGITAL SCREENING BILATERAL MAMMOGRAM WITH CAD

[R MLO (1 of 2)]
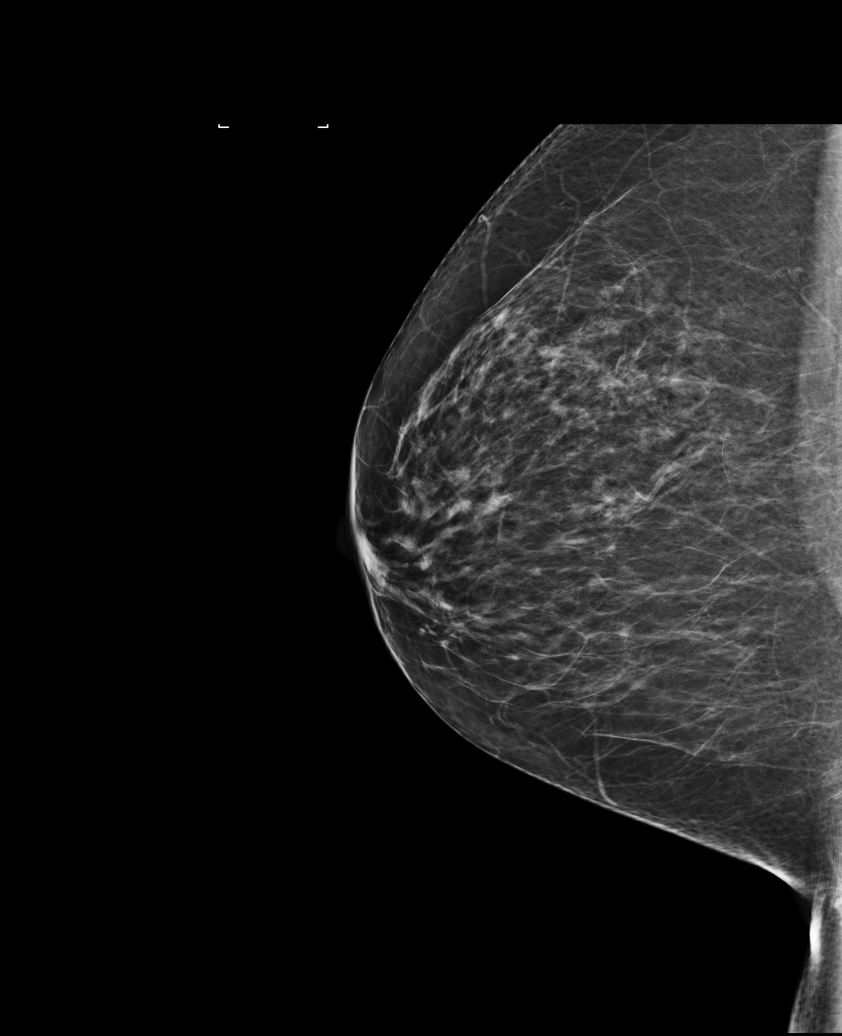

[R MLO (2 of 2)]
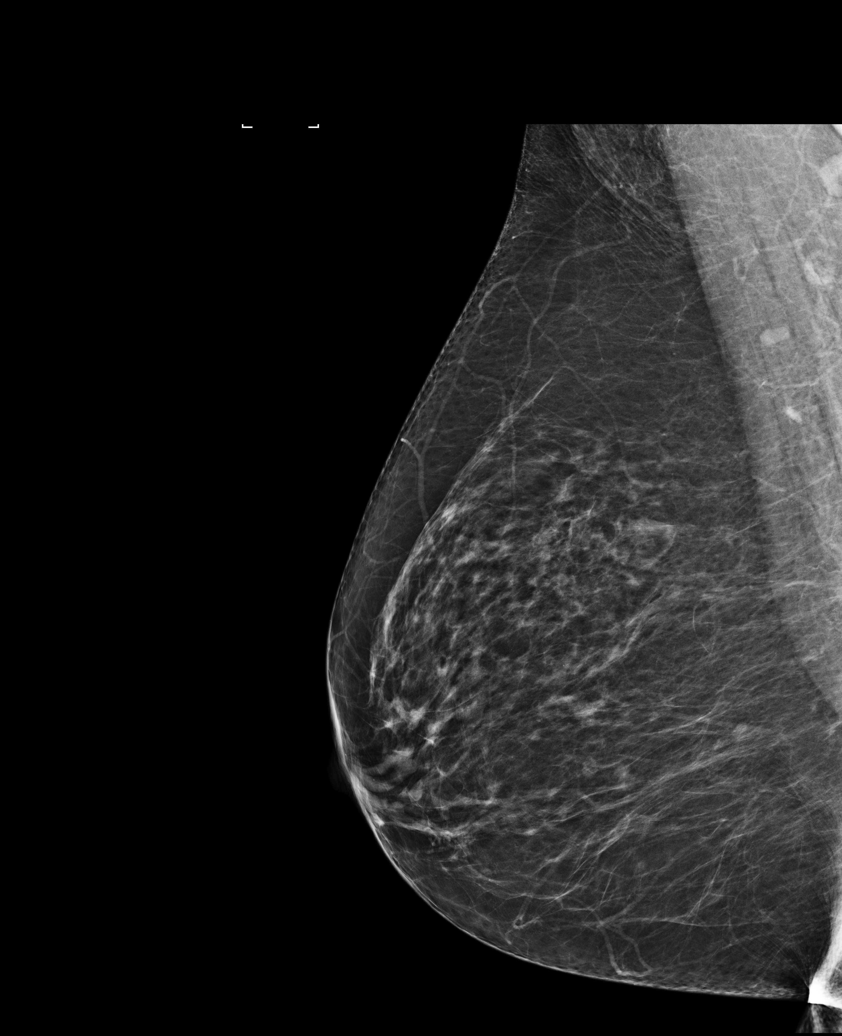

[L MLO]
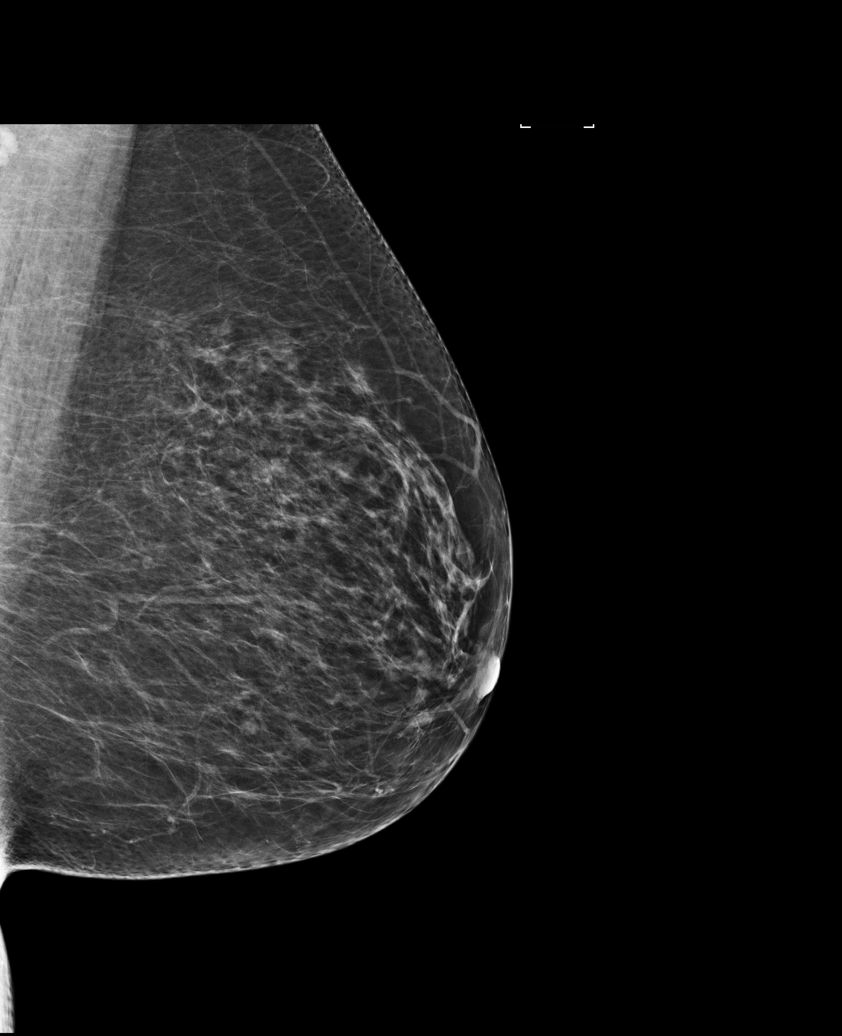

[R CC]
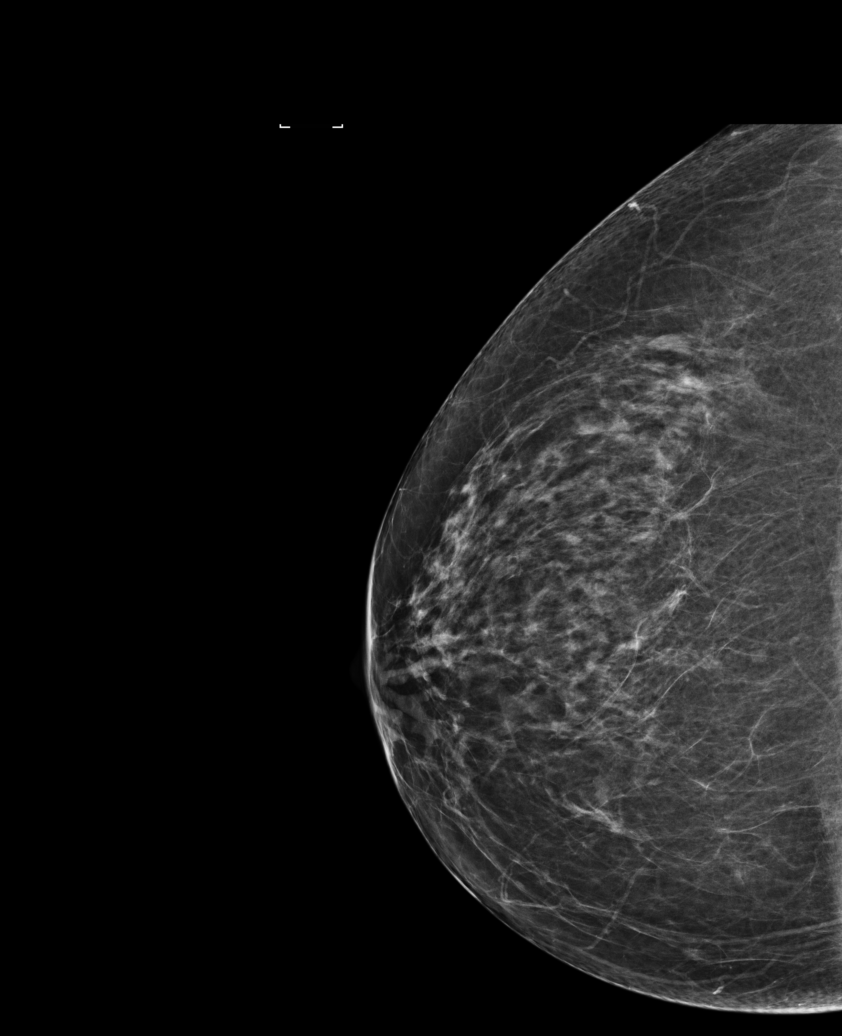

[L CC]
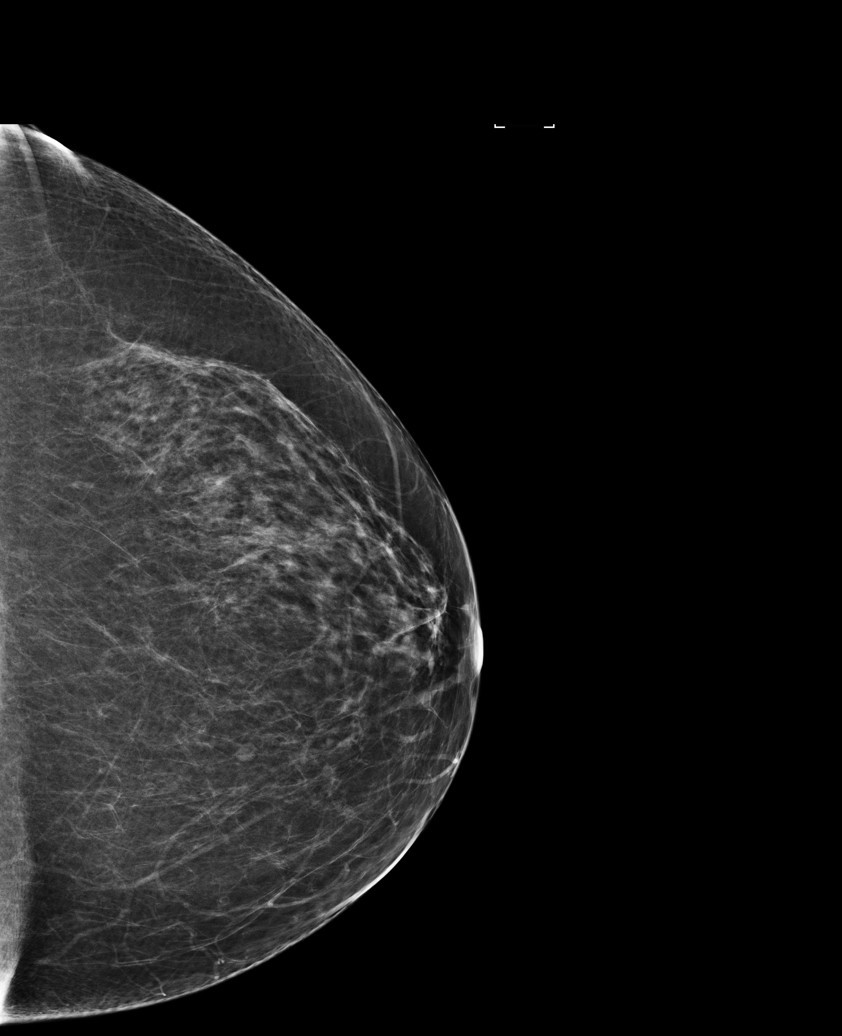

[5 of 5 positions shown; findings below may reference images not displayed]

ACR Breast Density Category b: There are scattered areas of
fibroglandular density.
FINDINGS: There are no findings suspicious for malignancy. Images were
processed with CAD.
IMPRESSION: No mammographic evidence of malignancy. A result letter of this
screening mammogram will be mailed directly to the patient.

RECOMMENDATION:
Screening mammogram in one year. (Code:AS-G-LCT)

BI-RADS CATEGORY  1: Negative.

## 2018-07-15 ENCOUNTER — Other Ambulatory Visit: Payer: Self-pay | Admitting: Family

## 2018-07-15 DIAGNOSIS — I10 Essential (primary) hypertension: Secondary | ICD-10-CM

## 2018-08-30 ENCOUNTER — Telehealth: Payer: Self-pay | Admitting: Family

## 2018-08-30 NOTE — Telephone Encounter (Signed)
Patient stated that she wanted to check with insurance first to see how much it would cost her to come in before making appointment.

## 2018-08-30 NOTE — Telephone Encounter (Signed)
Call patient I received a form for [employer] program.  She would need an appointment for medical clearance.  Please make with patient.

## 2018-09-01 NOTE — Telephone Encounter (Signed)
Noted Will hold form

## 2018-09-03 ENCOUNTER — Other Ambulatory Visit: Payer: Self-pay | Admitting: Family

## 2018-09-03 DIAGNOSIS — Z1231 Encounter for screening mammogram for malignant neoplasm of breast: Secondary | ICD-10-CM

## 2018-10-18 ENCOUNTER — Other Ambulatory Visit: Payer: PPO

## 2018-11-01 ENCOUNTER — Ambulatory Visit: Payer: PPO

## 2018-11-05 ENCOUNTER — Other Ambulatory Visit: Payer: Self-pay | Admitting: Family

## 2018-11-05 DIAGNOSIS — E785 Hyperlipidemia, unspecified: Secondary | ICD-10-CM

## 2018-11-10 ENCOUNTER — Other Ambulatory Visit: Payer: Self-pay | Admitting: Family

## 2018-11-24 ENCOUNTER — Other Ambulatory Visit: Payer: Self-pay

## 2018-12-30 DIAGNOSIS — D2261 Melanocytic nevi of right upper limb, including shoulder: Secondary | ICD-10-CM | POA: Diagnosis not present

## 2018-12-30 DIAGNOSIS — D2271 Melanocytic nevi of right lower limb, including hip: Secondary | ICD-10-CM | POA: Diagnosis not present

## 2018-12-30 DIAGNOSIS — L570 Actinic keratosis: Secondary | ICD-10-CM | POA: Diagnosis not present

## 2018-12-30 DIAGNOSIS — Z85828 Personal history of other malignant neoplasm of skin: Secondary | ICD-10-CM | POA: Diagnosis not present

## 2018-12-30 DIAGNOSIS — Z08 Encounter for follow-up examination after completed treatment for malignant neoplasm: Secondary | ICD-10-CM | POA: Diagnosis not present

## 2018-12-30 DIAGNOSIS — D2272 Melanocytic nevi of left lower limb, including hip: Secondary | ICD-10-CM | POA: Diagnosis not present

## 2018-12-30 DIAGNOSIS — D2262 Melanocytic nevi of left upper limb, including shoulder: Secondary | ICD-10-CM | POA: Diagnosis not present

## 2018-12-30 DIAGNOSIS — X32XXXA Exposure to sunlight, initial encounter: Secondary | ICD-10-CM | POA: Diagnosis not present

## 2018-12-30 DIAGNOSIS — D225 Melanocytic nevi of trunk: Secondary | ICD-10-CM | POA: Diagnosis not present

## 2018-12-31 ENCOUNTER — Ambulatory Visit: Payer: Self-pay

## 2019-01-06 ENCOUNTER — Other Ambulatory Visit: Payer: Self-pay | Admitting: Family

## 2019-01-06 DIAGNOSIS — I10 Essential (primary) hypertension: Secondary | ICD-10-CM

## 2019-01-21 ENCOUNTER — Ambulatory Visit: Payer: Self-pay

## 2019-01-21 ENCOUNTER — Encounter: Payer: PPO | Admitting: Family

## 2019-01-26 ENCOUNTER — Other Ambulatory Visit: Payer: Self-pay

## 2019-01-31 ENCOUNTER — Ambulatory Visit: Payer: Medicare HMO | Attending: Family

## 2019-02-02 ENCOUNTER — Encounter: Payer: Self-pay | Admitting: Family

## 2019-02-02 ENCOUNTER — Ambulatory Visit (INDEPENDENT_AMBULATORY_CARE_PROVIDER_SITE_OTHER): Payer: Medicare HMO | Admitting: Family

## 2019-02-02 ENCOUNTER — Other Ambulatory Visit: Payer: Self-pay

## 2019-02-02 VITALS — BP 142/84 | HR 93 | Temp 98.1°F | Ht 68.0 in | Wt 203.8 lb

## 2019-02-02 DIAGNOSIS — Z0000 Encounter for general adult medical examination without abnormal findings: Secondary | ICD-10-CM | POA: Diagnosis not present

## 2019-02-02 DIAGNOSIS — I10 Essential (primary) hypertension: Secondary | ICD-10-CM | POA: Diagnosis not present

## 2019-02-02 DIAGNOSIS — Z122 Encounter for screening for malignant neoplasm of respiratory organs: Secondary | ICD-10-CM | POA: Diagnosis not present

## 2019-02-02 LAB — CBC WITH DIFFERENTIAL/PLATELET
Basophils Absolute: 0 10*3/uL (ref 0.0–0.1)
Basophils Relative: 0.7 % (ref 0.0–3.0)
Eosinophils Absolute: 0.1 10*3/uL (ref 0.0–0.7)
Eosinophils Relative: 1.2 % (ref 0.0–5.0)
HCT: 40.9 % (ref 36.0–46.0)
Hemoglobin: 13.7 g/dL (ref 12.0–15.0)
Lymphocytes Relative: 28.4 % (ref 12.0–46.0)
Lymphs Abs: 1.6 10*3/uL (ref 0.7–4.0)
MCHC: 33.6 g/dL (ref 30.0–36.0)
MCV: 91.6 fl (ref 78.0–100.0)
Monocytes Absolute: 0.4 10*3/uL (ref 0.1–1.0)
Monocytes Relative: 7.6 % (ref 3.0–12.0)
Neutro Abs: 3.4 10*3/uL (ref 1.4–7.7)
Neutrophils Relative %: 62.1 % (ref 43.0–77.0)
Platelets: 290 10*3/uL (ref 150.0–400.0)
RBC: 4.46 Mil/uL (ref 3.87–5.11)
RDW: 13.2 % (ref 11.5–15.5)
WBC: 5.5 10*3/uL (ref 4.0–10.5)

## 2019-02-02 LAB — COMPREHENSIVE METABOLIC PANEL
ALT: 19 U/L (ref 0–35)
AST: 16 U/L (ref 0–37)
Albumin: 4.9 g/dL (ref 3.5–5.2)
Alkaline Phosphatase: 71 U/L (ref 39–117)
BUN: 18 mg/dL (ref 6–23)
CO2: 27 mEq/L (ref 19–32)
Calcium: 9.7 mg/dL (ref 8.4–10.5)
Chloride: 99 mEq/L (ref 96–112)
Creatinine, Ser: 0.96 mg/dL (ref 0.40–1.20)
GFR: 57.61 mL/min — ABNORMAL LOW (ref >60.00)
Glucose, Bld: 108 mg/dL — ABNORMAL HIGH (ref 70–99)
Potassium: 5 mEq/L (ref 3.5–5.1)
Sodium: 135 mEq/L (ref 135–145)
Total Bilirubin: 0.5 mg/dL (ref 0.2–1.2)
Total Protein: 7.7 g/dL (ref 6.0–8.3)

## 2019-02-02 LAB — LIPID PANEL
Cholesterol: 188 mg/dL (ref 0–200)
HDL: 48.4 mg/dL (ref >39.00)
NonHDL: 140.06
Total CHOL/HDL Ratio: 4
Triglycerides: 243 mg/dL — ABNORMAL HIGH (ref 0.0–149.0)
VLDL: 48.6 mg/dL — ABNORMAL HIGH (ref 0.0–40.0)

## 2019-02-02 LAB — LDL CHOLESTEROL, DIRECT: Direct LDL: 113 mg/dL

## 2019-02-02 LAB — VITAMIN D 25 HYDROXY (VIT D DEFICIENCY, FRACTURES): VITD: 23.03 ng/mL — ABNORMAL LOW (ref 30.00–100.00)

## 2019-02-02 LAB — HEMOGLOBIN A1C: Hgb A1c MFr Bld: 6.2 % (ref 4.6–6.5)

## 2019-02-02 LAB — TSH: TSH: 3.64 u[IU]/mL (ref 0.35–4.50)

## 2019-02-02 NOTE — Progress Notes (Signed)
Subjective:    Patient ID: Alicia Ramos, female    DOB: [birth year], 68 y.o.   MRN: [id number]  CC: Alicia Ramos is a 68 y.o. female who presents today for physical exam.    HPI: Doing well. No complaints.   HTN- at home 140/78. Compliant with medication.  Denies exertional chest pain or pressure, numbness or tingling radiating to left arm or jaw, palpitations, dizziness, frequent headaches, changes in vision, or shortness of breath.   HLD- compliant with medication      Colorectal Cancer Screening: UTD ; screening every 10 years Breast Cancer Screening: Mammogram scheduled this month.  Cervical Cancer Screening: NO vaginal bleeding. PAP normal 2018. No cervix.  Bone Health screening/DEXA for 65+:due ; scheduled with mammogram.  Lung Cancer Screening: in CT Chest screening.   Immunizations       Tetanus - due        Pneumococcal - complete  Labs: Screening labs today. Exercise: Gets regular exercise. Yoga.  Alcohol use: occasional Smoking/tobacco use: Former smoker.  Regular dental exams: UTD Wears seat belt: Yes. Skin: follows with dermatology  HISTORY:  Past Medical History:  Diagnosis Date  . Esophageal ulcer 2000   Required blood transfusion  . HTN (hypertension)   . Hyperlipidemia    Tried statin but had severe myalgia  . Shingles 2001   Left chest  . Skin cancer 2018   left shoulder  . Vaginal delivery    x1    Past Surgical History:  Procedure Laterality Date  . ABDOMINAL HYSTERECTOMY     no cervix seen on pelvic exam; 12/2016, [facility]  . COLONOSCOPY WITH PROPOFOL N/A 07/15/2017   Procedure: COLONOSCOPY WITH PROPOFOL;  Surgeon: Manya Silvas, MD;  Location: [facility] ENDOSCOPY;  Service: Endoscopy;  Laterality: N/A;   Family History  Problem Relation Age of Onset  . Hypertension Mother   . Lung cancer Father   . Throat cancer Father   . Hypertension Sister   . Breast cancer Paternal Aunt       ALLERGIES: Ace inhibitors, Losartan, and  Statins  Current Outpatient Medications on File Prior to Visit  Medication Sig Dispense Refill  . amLODipine (NORVASC) 5 MG tablet TAKE 1 TABLET BY MOUTH EVERY DAY 90 tablet 1  . metoprolol succinate (TOPROL-XL) 50 MG 24 hr tablet TAKE ONE TABLET BY MOUTH EVERY DAY IMMEDIATELY FOLLOWING A MEAL. 90 tablet 1  . pravastatin (PRAVACHOL) 40 MG tablet TAKE ONE TABLET BY MOUTH EVERY DAY 90 tablet 1   No current facility-administered medications on file prior to visit.     Social History   Tobacco Use  . Smoking status: Former Smoker    Packs/day: 1.00    Years: 39.00    Pack years: 39.00    Quit date: 08/01/2003    Years since quitting: 15.5  . Smokeless tobacco: Never Used  Substance Use Topics  . Alcohol use: Yes    Comment: Occasional  . Drug use: Not on file    Review of Systems  Constitutional: Negative for chills, fever and unexpected weight change.  HENT: Negative for congestion.   Respiratory: Negative for cough.   Cardiovascular: Negative for chest pain, palpitations and leg swelling.  Gastrointestinal: Negative for nausea and vomiting.  Genitourinary: Negative for pelvic pain and vaginal bleeding.  Musculoskeletal: Negative for arthralgias and myalgias.  Skin: Negative for rash.  Neurological: Negative for headaches.  Hematological: Negative for adenopathy.  Psychiatric/Behavioral: Negative for confusion.  Objective:    BP (!) 142/84   Pulse 93   Temp 98.1 F (36.7 C)   Ht 5\' 8"  (1.727 m)   Wt 203 lb 12.8 oz (92.4 kg)   SpO2 96%   BMI 30.99 kg/m   BP Readings from Last 3 Encounters:  02/02/19 (!) 142/84  01/20/18 (!) 138/92  10/30/17 140/82   Wt Readings from Last 3 Encounters:  02/02/19 203 lb 12.8 oz (92.4 kg)  04/23/18 207 lb (93.9 kg)  01/20/18 207 lb 8 oz (94.1 kg)    Physical Exam Vitals signs reviewed.  Constitutional:      Appearance: She is well-developed.  Eyes:     Conjunctiva/sclera: Conjunctivae normal.  Neck:     Thyroid: No  thyroid mass or thyromegaly.  Cardiovascular:     Rate and Rhythm: Normal rate and regular rhythm.     Pulses: Normal pulses.     Heart sounds: Normal heart sounds.  Pulmonary:     Effort: Pulmonary effort is normal.     Breath sounds: Normal breath sounds. No wheezing, rhonchi or rales.  Chest:     Breasts: Breasts are symmetrical.        Right: No inverted nipple, mass, nipple discharge, skin change or tenderness.        Left: No inverted nipple, mass, nipple discharge, skin change or tenderness.  Lymphadenopathy:     Head:     Right side of head: No submental, submandibular, tonsillar, preauricular, posterior auricular or occipital adenopathy.     Left side of head: No submental, submandibular, tonsillar, preauricular, posterior auricular or occipital adenopathy.     Cervical: No cervical adenopathy.     Right cervical: No superficial, deep or posterior cervical adenopathy.    Left cervical: No superficial, deep or posterior cervical adenopathy.  Skin:    General: Skin is warm and dry.  Neurological:     Mental Status: She is alert.  Psychiatric:        Speech: Speech normal.        Behavior: Behavior normal.        Thought Content: Thought content normal.        Assessment & Plan:   Problem List Items Addressed This Visit      Cardiovascular and Mediastinum   Hypertension    Not on medications today. She will send me a log from home.         Other   Routine general medical examination at a health care facility - Primary    CBE performed. Deferred pelvic in the absence of complaints. She also has no cervix ( see notes from 2018) , normal pap 2018 and h/o hysterectomy.  Messaged Burgess Estelle in regards to continuing Ct lung cancer screen.       Relevant Orders   TSH   CBC with Differential/Platelet   Comprehensive metabolic panel   Hemoglobin A1c   Lipid panel   VITAMIN D 25 Hydroxy (Vit-D Deficiency, Fractures)    Other Visit Diagnoses    Encounter for  screening for malignant neoplasm of respiratory organs       Relevant Orders   CT CHEST LUNG CANCER SCREENING LOW DOSE WO CONTRAST       I have discontinued Seren L. Jachim's cholecalciferol. I am also having her maintain her pravastatin, metoprolol succinate, and amLODipine.   No orders of the defined types were placed in this encounter.   Return precautions given.   Risks, benefits, and alternatives of the  medications and treatment plan prescribed today were discussed, and patient expressed understanding.   Education regarding symptom management and diagnosis given to patient on AVS.   Continue to follow with [name], FNP for routine health maintenance.   Alicia Ramos and I agreed with plan.   [name], FNP

## 2019-02-02 NOTE — Assessment & Plan Note (Signed)
CBE performed. Deferred pelvic in the absence of complaints. She also has no cervix ( see notes from 2018) , normal pap 2018 and h/o hysterectomy.  Messaged Alicia Ramos in regards to continuing Ct lung cancer screen.

## 2019-02-02 NOTE — Patient Instructions (Addendum)
Nice to see you !  Please call with record of tetanus vaccine; we do not have.   Today we discussed referrals, orders. CT CHEST for former smoker   I have placed these orders in the system for you.  Please be sure to give [place] a call if you have not heard from our office regarding this. We should hear from [place] within ONE week with information regarding your appointment. If not, please let me know immediately.     Call us with blood pressure readings.   Monitor blood pressure,  Goal is less than 120/80, based on newest guidelines; if persistently higher, please make sooner follow up appointment so we can recheck you blood pressure and manage medications

## 2019-02-02 NOTE — Assessment & Plan Note (Signed)
Not on medications today. She will send me a log from home.

## 2019-02-04 ENCOUNTER — Telehealth: Payer: Self-pay | Admitting: Family

## 2019-02-04 NOTE — Telephone Encounter (Signed)
Call pt Advise her I heard from CT lung cancer program  Shawn stated: Unfortunately with her quit date of 08/01/03 she can't have lung screening scans any longer. They are sticklers to < 15 years. She did have a scan last year with the program and it was good. If she had had nodules seen on the last scan we could have transitioned her to have them followed in the lung nodule program. However, this wasn't the case.   Bottom line: no more lung scans for her since it has been 15 years since she quit smoking. Ensure patient is aware of this

## 2019-02-04 NOTE — Telephone Encounter (Signed)
-----   Message from Lieutenant Diego, RN sent at 02/02/2019 12:04 PM EDT ----- Alicia Ramos, Unfortunately with her quit date of 08/01/03 she can't have lung screening scans any longer. They are sticklers to < 15 years. She did have a scan last year with the program and it was good. If she had had nodules seen on the last scan we could have transitioned her to have them followed in the lung nodule program. However, this wasn't the case.  Sorry, but thank you for following up, Alicia Ramos ----- Message ----- From: [name], FNP Sent: 02/02/2019  10:49 AM EDT To: Lieutenant Diego, RN  Hi there!  Can pt still participate in lung screening program?  She is right on the quit at 15 years ago..   And missed last year.   Let me know!  Alicia Ramos

## 2019-02-07 NOTE — Telephone Encounter (Signed)
Call pt   Reviewed blood pressure. Very close to goal < 120/80.   Advise her to trial taking one ( if not both) pressure medications in the evening or bedtime.  Sometimes this will result in lower blood pressure throughout the day.  Emphasized low-salt.  If she does not find that more consistently she is closer to 120/80, please advise her to make a follow-up with [place]    7/8: 129/70, 131/76  7/9: 133/71  7/10: 125/79

## 2019-02-08 NOTE — Telephone Encounter (Signed)
Patient notified and voiced understanding.

## 2019-02-11 ENCOUNTER — Telehealth: Payer: Self-pay | Admitting: *Deleted

## 2019-02-11 DIAGNOSIS — Z13820 Encounter for screening for osteoporosis: Secondary | ICD-10-CM

## 2019-02-11 NOTE — Telephone Encounter (Signed)
Copied from [place]. Topic: General - Other >> Feb 11, 2019  1:27 PM Celene Kras A wrote: Reason for CRM: Pt called stating she is needing an order put in for a bone density test because her last one expired. Please advise.

## 2019-02-14 NOTE — Telephone Encounter (Signed)
LM that bone density was ordered.

## 2019-02-14 NOTE — Telephone Encounter (Signed)
Call pt Bone density ordered

## 2019-03-03 ENCOUNTER — Ambulatory Visit (INDEPENDENT_AMBULATORY_CARE_PROVIDER_SITE_OTHER): Payer: Medicare HMO

## 2019-03-03 ENCOUNTER — Other Ambulatory Visit: Payer: Self-pay

## 2019-03-03 VITALS — BP 124/74 | HR 83

## 2019-03-03 DIAGNOSIS — Z0000 Encounter for general adult medical examination without abnormal findings: Secondary | ICD-10-CM

## 2019-03-03 NOTE — Patient Instructions (Addendum)
  Ms. Dyck , Thank you for taking time to come for your Medicare Wellness Visit. I appreciate your ongoing commitment to your health goals. Please review the following plan we discussed and let me know if I can assist you in the future.   These are the goals we discussed: Goals      Patient Stated   . DIET - REDUCE PORTION SIZE (pt-stated)     Maintain exercise regimen       This is a list of the screening recommended for you and due dates:  Health Maintenance  Topic Date Due  . Flu Shot  02/26/2019  . Mammogram  10/15/2019  . Colon Cancer Screening  07/16/2027  . Tetanus Vaccine  02/03/2029  . DEXA scan (bone density measurement)  Completed  .  Hepatitis C: One time screening is recommended by Center for Disease Control  (CDC) for  adults born from [birth year] through 1965.   Completed  . Pneumonia vaccines  Completed

## 2019-03-03 NOTE — Progress Notes (Signed)
Subjective:   SALIHA SALTS is a 68 y.o. female who presents for Medicare Annual (Subsequent) preventive examination.  Review of Systems:  No ROS.  Medicare Wellness Virtual Visit.  Visual/audio telehealth visit, UTA vital signs.  Blood pressure provided by patient.  See social history for additional risk factors.   Cardiac Risk Factors include: advanced age (>68men, >60 women);hypertension     Objective:     Vitals: BP 124/74 (BP Location: Left Arm, Patient Position: Sitting, Cuff Size: Normal)   Pulse 83   There is no height or weight on file to calculate BMI.  Advanced Directives 03/03/2019  Does Patient Have a Medical Advance Directive? No  Would patient like information on creating a medical advance directive? No - Patient declined    Tobacco Social History   Tobacco Use  Smoking Status Former Smoker  . Packs/day: 1.00  . Years: 39.00  . Pack years: 39.00  . Quit date: 08/01/2003  . Years since quitting: 15.5  Smokeless Tobacco Never Used     Counseling given: Not Answered   Clinical Intake:  Pre-visit preparation completed: Yes        Diabetes: No  How often do you need to have someone help you when you read instructions, pamphlets, or other written materials from your doctor or pharmacy?: 1 - Never  Interpreter Needed?: No     Past Medical History:  Diagnosis Date  . Esophageal ulcer 2000   Required blood transfusion  . HTN (hypertension)   . Hyperlipidemia    Tried statin but had severe myalgia  . Shingles 2001   Left chest  . Skin cancer 2018   left shoulder  . Vaginal delivery    x1   Past Surgical History:  Procedure Laterality Date  . ABDOMINAL HYSTERECTOMY     no cervix seen on pelvic exam; 12/2016, [facility]  . COLONOSCOPY WITH PROPOFOL N/A 07/15/2017   Procedure: COLONOSCOPY WITH PROPOFOL;  Surgeon: Manya Silvas, MD;  Location: [facility] ENDOSCOPY;  Service: Endoscopy;  Laterality: N/A;   Family History  Problem Relation Age of  Onset  . Hypertension Mother   . Lung cancer Father   . Throat cancer Father   . Hypertension Sister   . Breast cancer Paternal Aunt    Social History   Socioeconomic History  . Marital status: Divorced    Spouse name: Not on file  . Number of children: Not on file  . Years of education: Not on file  . Highest education level: Not on file  Occupational History  . Not on file  Social Needs  . Financial resource strain: Not hard at all  . Food insecurity    Worry: Never true    Inability: Never true  . Transportation needs    Medical: No    Non-medical: No  Tobacco Use  . Smoking status: Former Smoker    Packs/day: 1.00    Years: 39.00    Pack years: 39.00    Quit date: 08/01/2003    Years since quitting: 15.5  . Smokeless tobacco: Never Used  Substance and Sexual Activity  . Alcohol use: Yes    Comment: Occasional  . Drug use: Not on file  . Sexual activity: Not Currently  Lifestyle  . Physical activity    Days per week: 5 days    Minutes per session: 60 min  . Stress: Not at all  Relationships  . Social connections    Talks on phone: Not on file  Gets together: Not on file    Attends religious service: Not on file    Active member of club or organization: Not on file    Attends meetings of clubs or organizations: Not on file    Relationship status: Not on file  Other Topics Concern  . Not on file  Social History Narrative   Regular Exercise -  Yes   Lives in [place] w/son          Outpatient Encounter Medications as of 03/03/2019  Medication Sig  . amLODipine (NORVASC) 5 MG tablet TAKE 1 TABLET BY MOUTH EVERY DAY  . metoprolol succinate (TOPROL-XL) 50 MG 24 hr tablet TAKE ONE TABLET BY MOUTH EVERY DAY IMMEDIATELY FOLLOWING A MEAL.  . pravastatin (PRAVACHOL) 40 MG tablet TAKE ONE TABLET BY MOUTH EVERY DAY  . VITAMIN D PO Take 800 mg by mouth.   No facility-administered encounter medications on file as of 03/03/2019.     Activities of Daily Living  In your present state of health, do you have any difficulty performing the following activities: 03/03/2019 02/02/2019  Hearing? N N  Vision? N N  Difficulty concentrating or making decisions? N N  Walking or climbing stairs? N N  Dressing or bathing? N -  Doing errands, shopping? N N  Preparing Food and eating ? N -  Using the Toilet? N -  In the past six months, have you accidently leaked urine? N -  Do you have problems with loss of bowel control? N -  Managing your Medications? N -  Managing your Finances? N -  Housekeeping or managing your Housekeeping? N -  Some recent data might be hidden    Patient Care Team: [name], FNP as PCP - General (Family Medicine)    Assessment:   This is a routine wellness examination for Miho.  I connected with patient 03/03/19 at 10:30 AM EDT by an audio enabled telemedicine application and verified that I am speaking with the correct person using two identifiers. Patient stated full name and DOB. Patient gave permission to continue with virtual visit. Patient's location was at home and Nurse's location was at [place] office.   Patient has considered consult with cardiology and is now agreeable  per pcp suggestion for further re certification. Deferred to pcp for follow up.   Health Screenings  Mammogram - 09/2017; scheduled 02/2019 Colonoscopy - 06/2017 Bone Density - 06/2010; scheduled 02/2019 Glaucoma -none Hearing -demonstrates normal hearing during visit. Hepatitis C Screening-  Hemoglobin A1C - 01/2019 (6.2) Cholesterol - 01/2019 Dental- visits every 6 months Vision- UTD. Wears glasses.   Social  Alcohol intake - yes      Smoking history- former    Smokers in home? none Illicit drug use? none Exercise - walking, yoga 5 days weekly 60 minutes Diet - Brain food diet Sexually Active -not currently BMI- discussed the importance of a healthy diet, water intake and the benefits of aerobic exercise.  Educational material provided.    Safety  Patient feels safe at home- yes Patient does have smoke detectors at home- yes Patient does wear sunscreen or protective clothing when in direct sunlight -yes Patient does wear seat belt when in a moving vehicle -yes Patient drives- yes  [id number] precautions and sickness symptoms discussed.   Activities of Daily Living Patient denies needing assistance with: driving, household chores, feeding themselves, getting from bed to chair, getting to the toilet, bathing/showering, dressing, managing money, or preparing meals.  No new identified risk  were noted.    Depression Screen Patient denies losing interest in daily life, feeling hopeless, or crying easily over simple problems.   Medication-taking as directed and without issues.   Fall Screen Patient denies being afraid of falling or falling in the last year.   Memory Screen Patient is alert.  Patient denies difficulty focusing, concentrating or misplacing items. Correctly identified the president of the [place] , season and recall. Patient likes to read and stays active with yoga for brain stimulation.  Immunizations The following Immunizations were discussed: Influenza, shingles, pneumonia, and tetanus.   Other Providers Patient Care Team: [name], FNP as PCP - General (Family Medicine)  Exercise Activities and Dietary recommendations Current Exercise Habits: Home exercise routine, Type of exercise: yoga;walking, Time (Minutes): 60, Frequency (Times/Week): 5, Weekly Exercise (Minutes/Week): 300, Intensity: Mild  Goals      Patient Stated   . DIET - REDUCE PORTION SIZE (pt-stated)     Maintain exercise regimen       Fall Risk Fall Risk  03/03/2019 10/30/2017 11/12/2016 09/27/2015 03/04/2013  Falls in the past year? 0 No No No Yes  Number falls in past yr: - - - - 1  Injury with Fall? - - - - No  Comment - - - - bruising, fell after hiking  Is the patient's home free of loose throw rugs in walkways, pet beds,  electrical cords, etc? yes      Grab bars in the bathroom? yes      Handrails on the stairs? yes        Adequate lighting? yes     Depression Screen PHQ 2/9 Scores 03/03/2019 10/30/2017 11/12/2016 09/27/2015  PHQ - 2 Score 0 0 0 0     Cognitive Function MMSE - Mini Mental State Exam 10/30/2017  Orientation to time 5  Orientation to Place 5  Registration 3  Attention/ Calculation 5  Recall 3  Language- name 2 objects 2  Language- repeat 1  Language- follow 3 step command 3  Language- read & follow direction 1  Write a sentence 1  Copy design 1  Total score 30     6CIT Screen 03/03/2019  What Year? 0 points  What month? 0 points  What time? 0 points  Count back from 20 0 points  Months in reverse 0 points  Repeat phrase 0 points  Total Score 0    Immunization History  Administered Date(s) Administered  . Influenza Split 08/15/2011  . Pneumococcal Conjugate-13 03/28/2015  . Pneumococcal Polysaccharide-23 10/30/2017  . Tdap 02/04/2019   Screening Tests Health Maintenance  Topic Date Due  . INFLUENZA VACCINE  02/26/2019  . MAMMOGRAM  10/15/2019  . COLONOSCOPY  07/16/2027  . TETANUS/TDAP  02/03/2029  . DEXA SCAN  Completed  . Hepatitis C Screening  Completed  . PNA vac Low Risk Adult  Completed      Plan:   End of life planning; Advanced aging; Advanced directives discussed.  No HCPOA/Living Will.  Additional information declined at this time.  I have personally reviewed and noted the following in the patient's chart:   . Medical and social history . Use of alcohol, tobacco or illicit drugs  . Current medications and supplements . Functional ability and status . Nutritional status . Physical activity . Advanced directives . List of other physicians . Hospitalizations, surgeries, and ER visits in previous 12 months . Vitals . Screenings to include cognitive, depression, and falls . Referrals and appointments  In addition, I  have reviewed and discussed with  patient certain preventive protocols, quality metrics, and best practice recommendations. A written personalized care plan for preventive services as well as general preventive health recommendations were provided to patient.     Varney Biles, LPN  10/29/345   Agree with plan. [name], NP

## 2019-03-22 ENCOUNTER — Ambulatory Visit
Admission: RE | Admit: 2019-03-22 | Discharge: 2019-03-22 | Disposition: A | Discharge status: Home / Self Care | Payer: Medicare HMO | Source: Ambulatory Visit | Attending: Family | Admitting: Family

## 2019-03-22 ENCOUNTER — Other Ambulatory Visit: Payer: Self-pay

## 2019-03-22 DIAGNOSIS — Z13820 Encounter for screening for osteoporosis: Secondary | ICD-10-CM | POA: Insufficient documentation

## 2019-03-22 DIAGNOSIS — Z1231 Encounter for screening mammogram for malignant neoplasm of breast: Secondary | ICD-10-CM | POA: Diagnosis not present

## 2019-03-22 DIAGNOSIS — M85852 Other specified disorders of bone density and structure, left thigh: Secondary | ICD-10-CM | POA: Insufficient documentation

## 2019-03-22 DIAGNOSIS — Z780 Asymptomatic menopausal state: Secondary | ICD-10-CM | POA: Diagnosis not present

## 2019-03-23 ENCOUNTER — Encounter: Payer: Self-pay | Admitting: Family

## 2019-03-23 DIAGNOSIS — M8580 Other specified disorders of bone density and structure, unspecified site: Secondary | ICD-10-CM | POA: Insufficient documentation

## 2019-04-28 ENCOUNTER — Other Ambulatory Visit: Payer: Self-pay | Admitting: Family

## 2019-04-28 DIAGNOSIS — E785 Hyperlipidemia, unspecified: Secondary | ICD-10-CM

## 2019-05-02 ENCOUNTER — Other Ambulatory Visit: Payer: Self-pay | Admitting: Family

## 2019-05-07 DIAGNOSIS — R69 Illness, unspecified: Secondary | ICD-10-CM | POA: Diagnosis not present

## 2019-05-30 DIAGNOSIS — R69 Illness, unspecified: Secondary | ICD-10-CM | POA: Diagnosis not present

## 2019-07-12 ENCOUNTER — Other Ambulatory Visit: Payer: Self-pay | Admitting: Family

## 2019-07-12 DIAGNOSIS — I10 Essential (primary) hypertension: Secondary | ICD-10-CM

## 2019-08-01 ENCOUNTER — Telehealth: Payer: Self-pay | Admitting: Family

## 2019-08-01 NOTE — Telephone Encounter (Signed)
Call pt She is due for annual lung cancer screening.  If she would like to have this done which I certainly recommend, she may call [phone] to schedule.  Let us know if any problems in doing so

## 2019-08-02 NOTE — Telephone Encounter (Signed)
I spoke with patient & she stated that she had been scheduled last year then received a letter in the mail telling her she was rejected. She had stopped smoking over 15 years ago & therefore insurance would nol onger pay. I gave her the number & asked her to call just to check. I told her I was not sure if it would be covered or not. Pt was agreeable to call & find out.

## 2019-10-15 DIAGNOSIS — Z87891 Personal history of nicotine dependence: Secondary | ICD-10-CM | POA: Diagnosis not present

## 2019-10-15 DIAGNOSIS — E669 Obesity, unspecified: Secondary | ICD-10-CM | POA: Diagnosis not present

## 2019-10-15 DIAGNOSIS — Z6832 Body mass index (BMI) 32.0-32.9, adult: Secondary | ICD-10-CM | POA: Diagnosis not present

## 2019-10-15 DIAGNOSIS — R69 Illness, unspecified: Secondary | ICD-10-CM | POA: Diagnosis not present

## 2019-10-15 DIAGNOSIS — Z85828 Personal history of other malignant neoplasm of skin: Secondary | ICD-10-CM | POA: Diagnosis not present

## 2019-10-15 DIAGNOSIS — Z809 Family history of malignant neoplasm, unspecified: Secondary | ICD-10-CM | POA: Diagnosis not present

## 2019-10-15 DIAGNOSIS — Z8249 Family history of ischemic heart disease and other diseases of the circulatory system: Secondary | ICD-10-CM | POA: Diagnosis not present

## 2019-10-15 DIAGNOSIS — Z833 Family history of diabetes mellitus: Secondary | ICD-10-CM | POA: Diagnosis not present

## 2019-10-15 DIAGNOSIS — E785 Hyperlipidemia, unspecified: Secondary | ICD-10-CM | POA: Diagnosis not present

## 2019-10-15 DIAGNOSIS — I10 Essential (primary) hypertension: Secondary | ICD-10-CM | POA: Diagnosis not present

## 2019-10-27 ENCOUNTER — Other Ambulatory Visit: Payer: Self-pay | Admitting: Family

## 2019-10-27 DIAGNOSIS — E785 Hyperlipidemia, unspecified: Secondary | ICD-10-CM

## 2019-12-05 DIAGNOSIS — R69 Illness, unspecified: Secondary | ICD-10-CM | POA: Diagnosis not present

## 2019-12-30 DIAGNOSIS — L821 Other seborrheic keratosis: Secondary | ICD-10-CM | POA: Diagnosis not present

## 2019-12-30 DIAGNOSIS — D2271 Melanocytic nevi of right lower limb, including hip: Secondary | ICD-10-CM | POA: Diagnosis not present

## 2019-12-30 DIAGNOSIS — D2261 Melanocytic nevi of right upper limb, including shoulder: Secondary | ICD-10-CM | POA: Diagnosis not present

## 2019-12-30 DIAGNOSIS — Z85828 Personal history of other malignant neoplasm of skin: Secondary | ICD-10-CM | POA: Diagnosis not present

## 2019-12-30 DIAGNOSIS — A6929 Other conditions associated with Lyme disease: Secondary | ICD-10-CM | POA: Diagnosis not present

## 2019-12-30 DIAGNOSIS — D2262 Melanocytic nevi of left upper limb, including shoulder: Secondary | ICD-10-CM | POA: Diagnosis not present

## 2020-01-06 ENCOUNTER — Other Ambulatory Visit: Payer: Self-pay | Admitting: Family

## 2020-01-06 DIAGNOSIS — I10 Essential (primary) hypertension: Secondary | ICD-10-CM

## 2020-03-05 ENCOUNTER — Ambulatory Visit (INDEPENDENT_AMBULATORY_CARE_PROVIDER_SITE_OTHER): Payer: Medicare HMO

## 2020-03-05 VITALS — Ht 68.0 in | Wt 203.0 lb

## 2020-03-05 DIAGNOSIS — Z0000 Encounter for general adult medical examination without abnormal findings: Secondary | ICD-10-CM | POA: Diagnosis not present

## 2020-03-05 NOTE — Patient Instructions (Addendum)
Alicia Ramos , Thank you for taking time to come for your Medicare Wellness Visit. I appreciate your ongoing commitment to your health goals. Please review the following plan we discussed and let me know if I can assist you in the future.   These are the goals we discussed: Goals      Patient Stated     DIET - REDUCE PORTION SIZE (pt-stated)      Maintain exercise regimen       This is a list of the screening recommended for you and due dates:  Health Maintenance  Topic Date Due   Flu Shot  02/26/2020   Mammogram  03/21/2021   Colon Cancer Screening  07/16/2027   Tetanus Vaccine  02/03/2029   DEXA scan (bone density measurement)  Completed   COVID-19 Vaccine  Completed    Hepatitis C: One time screening is recommended by Center for Disease Control  (CDC) for  adults born from [birth year] through 1966.   Completed   Pneumonia vaccines  Completed    Immunizations Immunization History  Administered Date(s) Administered   Fluad Quad(high Dose 65+) 05/07/2019   Influenza Split 08/15/2011   PFIZER SARS-COV-2 Vaccination 09/06/2019, 09/27/2019   Pneumococcal Conjugate-13 03/28/2015   Pneumococcal Polysaccharide-23 10/30/2017   Tdap 01/30/2018, 02/04/2019    Conditions/risks identified: none new  Follow up in one year for your annual wellness visit    Preventive Care 2 Years and Older, Female Preventive care refers to lifestyle choices and visits with your health care provider that can promote health and wellness. What does preventive care include?  A yearly physical exam. This is also called an annual well check.  Dental exams once or twice a year.  Routine eye exams. Ask your health care provider how often you should have your eyes checked.  Personal lifestyle choices, including:  Daily care of your teeth and gums.  Regular physical activity.  Eating a healthy diet.  Avoiding tobacco and drug use.  Limiting alcohol use.  Practicing safe  sex.  Taking low-dose aspirin every day.  Taking vitamin and mineral supplements as recommended by your health care provider. What happens during an annual well check? The services and screenings done by your health care provider during your annual well check will depend on your age, overall health, lifestyle risk factors, and family history of disease. Counseling  Your health care provider may ask you questions about your:  Alcohol use.  Tobacco use.  Drug use.  Emotional well-being.  Home and relationship well-being.  Sexual activity.  Eating habits.  History of falls.  Memory and ability to understand (cognition).  Work and work Statistician.  Reproductive health. Screening  You may have the following tests or measurements:  Height, weight, and BMI.  Blood pressure.  Lipid and cholesterol levels. These may be checked every 5 years, or more frequently if you are over 55 years old.  Skin check.  Lung cancer screening. You may have this screening every year starting at age 20 if you have a 30-pack-year history of smoking and currently smoke or have quit within the past 15 years.  Fecal occult blood test (FOBT) of the stool. You may have this test every year starting at age 73.  Flexible sigmoidoscopy or colonoscopy. You may have a sigmoidoscopy every 5 years or a colonoscopy every 10 years starting at age 31.  Hepatitis C blood test.  Hepatitis B blood test.  Sexually transmitted disease (STD) testing.  Diabetes screening. This is done by  checking your blood sugar (glucose) after you have not eaten for a while (fasting). You may have this done every 1-3 years.  Bone density scan. This is done to screen for osteoporosis. You may have this done starting at age 28.  Mammogram. This may be done every 1-2 years. Talk to your health care provider about how often you should have regular mammograms. Talk with your health care provider about your test results,  treatment options, and if necessary, the need for more tests. Vaccines  Your health care provider may recommend certain vaccines, such as:  Influenza vaccine. This is recommended every year.  Tetanus, diphtheria, and acellular pertussis (Tdap, Td) vaccine. You may need a Td booster every 10 years.  Zoster vaccine. You may need this after age 9.  Pneumococcal 13-valent conjugate (PCV13) vaccine. One dose is recommended after age 54.  Pneumococcal polysaccharide (PPSV23) vaccine. One dose is recommended after age 48. Talk to your health care provider about which screenings and vaccines you need and how often you need them. This information is not intended to replace advice given to you by your health care provider. Make sure you discuss any questions you have with your health care provider. Document Released: 08/10/2015 Document Revised: 04/02/2016 Document Reviewed: 05/15/2015 Elsevier Interactive Patient Education  2017 [place] Prevention in the Home Falls can cause injuries. They can happen to people of all ages. There are many things you can do to make your home safe and to help prevent falls. What can I do on the outside of my home?  Regularly fix the edges of walkways and driveways and fix any cracks.  Remove anything that might make you trip as you walk through a door, such as a raised step or threshold.  Trim any bushes or trees on the path to your home.  Use bright outdoor lighting.  Clear any walking paths of anything that might make someone trip, such as rocks or tools.  Regularly check to see if handrails are loose or broken. Make sure that both sides of any steps have handrails.  Any raised decks and porches should have guardrails on the edges.  Have any leaves, snow, or ice cleared regularly.  Use sand or salt on walking paths during winter.  Clean up any spills in your garage right away. This includes oil or grease spills. What can I do in the  bathroom?  Use night lights.  Install grab bars by the toilet and in the tub and shower. Do not use towel bars as grab bars.  Use non-skid mats or decals in the tub or shower.  If you need to sit down in the shower, use a plastic, non-slip stool.  Keep the floor dry. Clean up any water that spills on the floor as soon as it happens.  Remove soap buildup in the tub or shower regularly.  Attach bath mats securely with double-sided non-slip rug tape.  Do not have throw rugs and other things on the floor that can make you trip. What can I do in the bedroom?  Use night lights.  Make sure that you have a light by your bed that is easy to reach.  Do not use any sheets or blankets that are too big for your bed. They should not hang down onto the floor.  Have a firm chair that has side arms. You can use this for support while you get dressed.  Do not have throw rugs and other things on the  floor that can make you trip. What can I do in the kitchen?  Clean up any spills right away.  Avoid walking on wet floors.  Keep items that you use a lot in easy-to-reach places.  If you need to reach something above you, use a strong step stool that has a grab bar.  Keep electrical cords out of the way.  Do not use floor polish or wax that makes floors slippery. If you must use wax, use non-skid floor wax.  Do not have throw rugs and other things on the floor that can make you trip. What can I do with my stairs?  Do not leave any items on the stairs.  Make sure that there are handrails on both sides of the stairs and use them. Fix handrails that are broken or loose. Make sure that handrails are as long as the stairways.  Check any carpeting to make sure that it is firmly attached to the stairs. Fix any carpet that is loose or worn.  Avoid having throw rugs at the top or bottom of the stairs. If you do have throw rugs, attach them to the floor with carpet tape.  Make sure that you have a  light switch at the top of the stairs and the bottom of the stairs. If you do not have them, ask someone to add them for you. What else can I do to help prevent falls?  Wear shoes that:  Do not have high heels.  Have rubber bottoms.  Are comfortable and fit you well.  Are closed at the toe. Do not wear sandals.  If you use a stepladder:  Make sure that it is fully opened. Do not climb a closed stepladder.  Make sure that both sides of the stepladder are locked into place.  Ask someone to hold it for you, if possible.  Clearly mark and make sure that you can see:  Any grab bars or handrails.  First and last steps.  Where the edge of each step is.  Use tools that help you move around (mobility aids) if they are needed. These include:  Canes.  Walkers.  Scooters.  Crutches.  Turn on the lights when you go into a dark area. Replace any light bulbs as soon as they burn out.  Set up your furniture so you have a clear path. Avoid moving your furniture around.  If any of your floors are uneven, fix them.  If there are any pets around you, be aware of where they are.  Review your medicines with your doctor. Some medicines can make you feel dizzy. This can increase your chance of falling. Ask your doctor what other things that you can do to help prevent falls. This information is not intended to replace advice given to you by your health care provider. Make sure you discuss any questions you have with your health care provider. Document Released: 05/10/2009 Document Revised: 12/20/2015 Document Reviewed: 08/18/2014 Elsevier Interactive Patient Education  2017 [employer].

## 2020-03-05 NOTE — Progress Notes (Addendum)
Subjective:   Alicia Ramos is a 69 y.o. female who presents for Medicare Annual (Subsequent) preventive examination.  Review of Systems    No ROS.  Medicare Wellness Virtual Visit.  Cardiac Risk Factors include: advanced age (>16men, >102 women);hypertension     Objective:    Today's Vitals   03/05/20 1035  Weight: 203 lb (92.1 kg)  Height: 5\' 8"  (7.544 m)   Body mass index is 30.87 kg/m.  Advanced Directives 03/05/2020 03/03/2019  Does Patient Have a Medical Advance Directive? No No  Would patient like information on creating a medical advance directive? No - Patient declined No - Patient declined    Current Medications (verified) Outpatient Encounter Medications as of 03/05/2020  Medication Sig  . amLODipine (NORVASC) 5 MG tablet TAKE 1 TABLET BY MOUTH EVERY DAY  . metoprolol succinate (TOPROL-XL) 50 MG 24 hr tablet TAKE ONE TABLET BY MOUTH EVERY DAY IMMEDIATELY FOLLOWING A MEAL.  . pravastatin (PRAVACHOL) 40 MG tablet TAKE 1 TABLET BY MOUTH EVERY DAY  . VITAMIN D PO Take 800 mg by mouth.   No facility-administered encounter medications on file as of 03/05/2020.    Allergies (verified) Ace inhibitors, Losartan, and Statins   History: Past Medical History:  Diagnosis Date  . Esophageal ulcer 2000   Required blood transfusion  . HTN (hypertension)   . Hyperlipidemia    Tried statin but had severe myalgia  . Shingles 2001   Left chest  . Skin cancer 2018   left shoulder  . Vaginal delivery    x1   Past Surgical History:  Procedure Laterality Date  . ABDOMINAL HYSTERECTOMY     no cervix seen on pelvic exam; 12/2016, [facility]  . COLONOSCOPY WITH PROPOFOL N/A 07/15/2017   Procedure: COLONOSCOPY WITH PROPOFOL;  Surgeon: Manya Silvas, MD;  Location: [facility] ENDOSCOPY;  Service: Endoscopy;  Laterality: N/A;   Family History  Problem Relation Age of Onset  . Hypertension Mother   . Lung cancer Father   . Throat cancer Father   . Hypertension Sister   . Breast  cancer Paternal Aunt    Social History   Socioeconomic History  . Marital status: Divorced    Spouse name: Not on file  . Number of children: Not on file  . Years of education: Not on file  . Highest education level: Not on file  Occupational History  . Not on file  Tobacco Use  . Smoking status: Former Smoker    Packs/day: 1.00    Years: 39.00    Pack years: 39.00    Quit date: 08/01/2003    Years since quitting: 16.6  . Smokeless tobacco: Never Used  Substance and Sexual Activity  . Alcohol use: Yes    Comment: Occasional  . Drug use: Not on file  . Sexual activity: Not Currently  Other Topics Concern  . Not on file  Social History Narrative   Regular Exercise -  Yes   Lives in [place] w/son         Social Determinants of Health   Financial Resource Strain: Low Risk   . Difficulty of Paying Living Expenses: Not hard at all  Food Insecurity: No Food Insecurity  . Worried About Charity fundraiser in the Last Year: Never true  . Ran Out of Food in the Last Year: Never true  Transportation Needs: No Transportation Needs  . Lack of Transportation (Medical): No  . Lack of Transportation (Non-Medical): No  Physical Activity:   .  Days of Exercise per Week:   . Minutes of Exercise per Session:   Stress: No Stress Concern Present  . Feeling of Stress : Not at all  Social Connections: Unknown  . Frequency of Communication with Friends and Family: More than three times a week  . Frequency of Social Gatherings with Friends and Family: More than three times a week  . Attends Religious Services: Not on file  . Active Member of Clubs or Organizations: Not on file  . Attends Archivist Meetings: Not on file  . Marital Status: Not on file    Tobacco Counseling Counseling given: Not Answered   Clinical Intake:  Pre-visit preparation completed: Yes        Diabetes: No  How often do you need to have someone help you when you read instructions,  pamphlets, or other written materials from your doctor or pharmacy?: 1 - Never  Interpreter Needed?: No      Activities of Daily Living In your present state of health, do you have any difficulty performing the following activities: 03/05/2020  Hearing? N  Vision? N  Difficulty concentrating or making decisions? N  Walking or climbing stairs? N  Dressing or bathing? N  Doing errands, shopping? N  Preparing Food and eating ? N  Using the Toilet? N  In the past six months, have you accidently leaked urine? N  Do you have problems with loss of bowel control? N  Managing your Medications? N  Managing your Finances? N  Housekeeping or managing your Housekeeping? N  Some recent data might be hidden    Patient Care Team: [name], FNP as PCP - General (Family Medicine)  Indicate any recent Medical Services you may have received from other than [facility] providers in the past year (date may be approximate).     Assessment:   This is a routine wellness examination for Alicia Ramos.  I connected with Alicia Ramos today by telephone and verified that I am speaking with the correct person using two identifiers. Location patient: home Location provider: work Persons participating in the virtual visit: patient, Marine scientist.    I discussed the limitations, risks, security and privacy concerns of performing an evaluation and management service by telephone and the availability of in person appointments. The patient expressed understanding and verbally consented to this telephonic visit.    Interactive audio and video telecommunications were attempted between this provider and patient, however failed, due to patient having technical difficulties OR patient did not have access to video capability.  We continued and completed visit with audio only.  Some vital signs may be absent or patient reported.   Hearing/Vision screen  Hearing Screening   125Hz  250Hz  500Hz  1000Hz  2000Hz  3000Hz  4000Hz  6000Hz  8000Hz     Right ear:           Left ear:           Comments: Patient is able to hear conversational tones without difficulty.  No issues reported.  Vision Screening Comments: Followed by [facility] Wears corrective lenses Visual acuity not assessed, virtual visit.  They have seen their ophthalmologist.     Dietary issues and exercise activities discussed: Current Exercise Habits: Home exercise routine, Type of exercise: yoga, Time (Minutes): 60, Frequency (Times/Week): 2, Weekly Exercise (Minutes/Week): 120, Intensity: Mild Healthy diet Good water intake Caffeine- 1-2 cups of coffee   Goals      Patient Stated   .  DIET - REDUCE PORTION SIZE (pt-stated)  Maintain exercise regimen      Depression Screen PHQ 2/9 Scores 03/05/2020 03/03/2019 10/30/2017 11/12/2016 09/27/2015 03/04/2013  PHQ - 2 Score 0 0 0 0 0 0    Fall Risk Fall Risk  03/05/2020 03/03/2019 10/30/2017 11/12/2016 09/27/2015  Falls in the past year? 0 0 No No No  Number falls in past yr: 0 - - - -  Injury with Fall? 0 - - - -  Comment - - - - -  Follow up Falls evaluation completed - - - -   Handrails in use when climbing stairs? Yes  Home free of loose throw rugs in walkways, pet beds, electrical cords, etc? Yes  Adequate lighting in your home to reduce risk of falls? Yes   ASSISTIVE DEVICES UTILIZED TO PREVENT FALLS:  Life alert? No  Use of a cane, walker or w/c? No  Grab bars in the bathroom? No  Shower chair or bench in shower? No  Elevated toilet seat or a handicapped toilet? No   TIMED UP AND GO:  Was the test performed? No . Virtual visit.  Cognitive Function: Patient is alert and oriented x3.  She works 3 days a week  MMSE - [place] Exam 10/30/2017  Orientation to time 5  Orientation to Place 5  Registration 3  Attention/ Calculation 5  Recall 3  Language- name 2 objects 2  Language- repeat 1  Language- follow 3 step command 3  Language- read & follow direction 1  Write a sentence 1  Copy  design 1  Total score 30     6CIT Screen 03/05/2020 03/03/2019  What Year? 0 points 0 points  What month? 0 points 0 points  What time? 0 points 0 points  Count back from 20 - 0 points  Months in reverse - 0 points  Repeat phrase - 0 points  Total Score - 0    Immunizations Immunization History  Administered Date(s) Administered  . Fluad Quad(high Dose 65+) 05/07/2019  . Influenza Split 08/15/2011  . PFIZER SARS-COV-2 Vaccination 09/06/2019, 09/27/2019  . Pneumococcal Conjugate-13 03/28/2015  . Pneumococcal Polysaccharide-23 10/30/2017  . Tdap 01/30/2018, 02/04/2019    Health Maintenance Health Maintenance  Topic Date Due  . INFLUENZA VACCINE  02/26/2020  . MAMMOGRAM  03/21/2021  . COLONOSCOPY  07/16/2027  . TETANUS/TDAP  02/03/2029  . DEXA SCAN  Completed  . COVID-19 Vaccine  Completed  . Hepatitis C Screening  Completed  . PNA vac Low Risk Adult  Completed    Dental Screening: Recommended annual dental exams for proper oral hygiene  Community Resource Referral / Chronic Care Management: CRR required this visit?  No   CCM required this visit?  No      Plan:   Keep all routine maintenance appointments.   I have personally reviewed and noted the following in the patient's chart:   . Medical and social history . Use of alcohol, tobacco or illicit drugs  . Current medications and supplements . Functional ability and status . Nutritional status . Physical activity . Advanced directives . List of other physicians . Hospitalizations, surgeries, and ER visits in previous 12 months . Vitals . Screenings to include cognitive, depression, and falls . Referrals and appointments  In addition, I have reviewed and discussed with patient certain preventive protocols, quality metrics, and best practice recommendations. A written personalized care plan for preventive services as well as general preventive health recommendations were provided to patient via mail.      Arby Barrette,  Lawsyn Heiler L, LPN   9/0/2111     Agree with plan. [name], NP

## 2020-04-29 ENCOUNTER — Other Ambulatory Visit: Payer: Self-pay | Admitting: Family

## 2020-04-29 DIAGNOSIS — E785 Hyperlipidemia, unspecified: Secondary | ICD-10-CM

## 2020-06-12 DIAGNOSIS — R69 Illness, unspecified: Secondary | ICD-10-CM | POA: Diagnosis not present

## 2020-06-30 ENCOUNTER — Other Ambulatory Visit: Payer: Self-pay | Admitting: Family

## 2020-06-30 DIAGNOSIS — I10 Essential (primary) hypertension: Secondary | ICD-10-CM

## 2020-07-02 ENCOUNTER — Other Ambulatory Visit: Payer: Self-pay | Admitting: Family

## 2020-07-04 DIAGNOSIS — R69 Illness, unspecified: Secondary | ICD-10-CM | POA: Diagnosis not present

## 2020-08-10 ENCOUNTER — Encounter: Payer: Self-pay | Admitting: Family

## 2020-08-10 ENCOUNTER — Ambulatory Visit (INDEPENDENT_AMBULATORY_CARE_PROVIDER_SITE_OTHER): Payer: Medicare HMO | Admitting: Family

## 2020-08-10 ENCOUNTER — Other Ambulatory Visit: Payer: Self-pay

## 2020-08-10 VITALS — BP 142/88 | HR 92 | Temp 97.9°F | Ht 68.0 in | Wt 209.8 lb

## 2020-08-10 DIAGNOSIS — E785 Hyperlipidemia, unspecified: Secondary | ICD-10-CM | POA: Diagnosis not present

## 2020-08-10 DIAGNOSIS — I10 Essential (primary) hypertension: Secondary | ICD-10-CM

## 2020-08-10 LAB — VITAMIN D 25 HYDROXY (VIT D DEFICIENCY, FRACTURES): VITD: 31.62 ng/mL (ref 30.00–100.00)

## 2020-08-10 LAB — LIPID PANEL
Cholesterol: 192 mg/dL (ref 0–200)
HDL: 52.3 mg/dL (ref >39.00)
NonHDL: 139.78
Total CHOL/HDL Ratio: 4
Triglycerides: 254 mg/dL — ABNORMAL HIGH (ref 0.0–149.0)
VLDL: 50.8 mg/dL — ABNORMAL HIGH (ref 0.0–40.0)

## 2020-08-10 LAB — CBC WITH DIFFERENTIAL/PLATELET
Basophils Absolute: 0.1 10*3/uL (ref 0.0–0.1)
Basophils Relative: 1.3 % (ref 0.0–3.0)
Eosinophils Absolute: 0.1 10*3/uL (ref 0.0–0.7)
Eosinophils Relative: 1.1 % (ref 0.0–5.0)
HCT: 40.9 % (ref 36.0–46.0)
Hemoglobin: 13.7 g/dL (ref 12.0–15.0)
Lymphocytes Relative: 31.4 % (ref 12.0–46.0)
Lymphs Abs: 1.5 10*3/uL (ref 0.7–4.0)
MCHC: 33.5 g/dL (ref 30.0–36.0)
MCV: 91.2 fl (ref 78.0–100.0)
Monocytes Absolute: 0.4 10*3/uL (ref 0.1–1.0)
Monocytes Relative: 7.7 % (ref 3.0–12.0)
Neutro Abs: 2.8 10*3/uL (ref 1.4–7.7)
Neutrophils Relative %: 58.5 % (ref 43.0–77.0)
Platelets: 283 10*3/uL (ref 150.0–400.0)
RBC: 4.49 Mil/uL (ref 3.87–5.11)
RDW: 13.1 % (ref 11.5–15.5)
WBC: 4.8 10*3/uL (ref 4.0–10.5)

## 2020-08-10 LAB — COMPREHENSIVE METABOLIC PANEL
ALT: 23 U/L (ref 0–35)
AST: 19 U/L (ref 0–37)
Albumin: 4.8 g/dL (ref 3.5–5.2)
Alkaline Phosphatase: 64 U/L (ref 39–117)
BUN: 22 mg/dL (ref 6–23)
CO2: 27 mEq/L (ref 19–32)
Calcium: 9.6 mg/dL (ref 8.4–10.5)
Chloride: 101 mEq/L (ref 96–112)
Creatinine, Ser: 0.92 mg/dL (ref 0.40–1.20)
GFR: 63.03 mL/min (ref >60.00)
Glucose, Bld: 142 mg/dL — ABNORMAL HIGH (ref 70–99)
Potassium: 4.4 mEq/L (ref 3.5–5.1)
Sodium: 137 mEq/L (ref 135–145)
Total Bilirubin: 0.5 mg/dL (ref 0.2–1.2)
Total Protein: 7.6 g/dL (ref 6.0–8.3)

## 2020-08-10 LAB — LDL CHOLESTEROL, DIRECT: Direct LDL: 112 mg/dL

## 2020-08-10 LAB — TSH: TSH: 4.43 u[IU]/mL (ref 0.35–4.50)

## 2020-08-10 LAB — HEMOGLOBIN A1C: Hgb A1c MFr Bld: 6.9 % — ABNORMAL HIGH (ref 4.6–6.5)

## 2020-08-10 MED ORDER — AMLODIPINE BESYLATE 10 MG PO TABS: 10.0000 mg | ORAL_TABLET | Freq: Every day | ORAL | 1 refills | Status: DC

## 2020-08-10 NOTE — Assessment & Plan Note (Signed)
Anticipate controlled. Pending lipid panel. Continue pravachol 40mg .

## 2020-08-10 NOTE — Assessment & Plan Note (Signed)
Uncontrolled. Increase amlodipine to 10mg  with close vigilance for leg swelling. Opted not to increase metoprolol 50mg  due to prior dizzines from regimen. If amlodipine causes swelling, we discussed trial of hctz with BP goal closer to 130/80.

## 2020-08-10 NOTE — Progress Notes (Signed)
Subjective:    Patient ID: Alicia Ramos, female    DOB: Sep 21, 1950, 70 y.o.   MRN: [id number]  CC: Alicia Ramos is a 70 y.o. female who presents today for follow up.   HPI: Feels well today No complaints.   HTN- compliant with amlodipine 5mg  , toprol 50mg  qd ; takes medications at night as medications make her dizzy. By taking at night, dizziness resolved. BP is always higher in doctors office. At home, 139/80, HR 95.    hld - compliant with pravachol 40mg .    due for mammogram, dexa   HISTORY:  Past Medical History:  Diagnosis Date  . Esophageal ulcer 2000   Required blood transfusion  . HTN (hypertension)   . Hyperlipidemia    Tried statin but had severe myalgia  . Shingles 2001   Left chest  . Skin cancer 2018   left shoulder  . Vaginal delivery    x1   Past Surgical History:  Procedure Laterality Date  . ABDOMINAL HYSTERECTOMY     no cervix seen on pelvic exam; 12/2016, Vinessa Macconnell  . COLONOSCOPY WITH PROPOFOL N/A 07/15/2017   Procedure: COLONOSCOPY WITH PROPOFOL;  Surgeon: Manya Silvas, MD;  Location: [facility] ENDOSCOPY;  Service: Endoscopy;  Laterality: N/A;   Family History  Problem Relation Age of Onset  . Hypertension Mother   . Lung cancer Father   . Throat cancer Father   . Hypertension Sister   . Breast cancer Paternal Aunt     Allergies: Ace inhibitors, Losartan, and Statins Current Outpatient Medications on File Prior to Visit  Medication Sig Dispense Refill  . metoprolol succinate (TOPROL-XL) 50 MG 24 hr tablet TAKE ONE TABLET BY MOUTH EVERY DAY IMMEDIATELY FOLLOWING A MEAL. 90 tablet 1  . pravastatin (PRAVACHOL) 40 MG tablet TAKE 1 TABLET BY MOUTH EVERY DAY 90 tablet 1  . VITAMIN D PO Take 800 mg by mouth.     No current facility-administered medications on file prior to visit.    Social History   Tobacco Use  . Smoking status: Former Smoker    Packs/day: 1.00    Years: 39.00    Pack years: 39.00    Quit date: 08/01/2003    Years since  quitting: 17.0  . Smokeless tobacco: Never Used  Substance Use Topics  . Alcohol use: Yes    Comment: Occasional    Review of Systems  Constitutional: Negative for chills and fever.  Respiratory: Negative for cough.   Cardiovascular: Negative for chest pain, palpitations and leg swelling.  Gastrointestinal: Negative for nausea and vomiting.  Neurological: Negative for dizziness (resolved) and headaches.      Objective:    BP (!) 142/88   Pulse 92   Temp 97.9 F (36.6 C)   Ht 5\' 8"  (1.727 m)   Wt 209 lb 12.8 oz (95.2 kg)   SpO2 97%   BMI 31.90 kg/m  BP Readings from Last 3 Encounters:  08/10/20 (!) 142/88  03/03/19 124/74  02/02/19 (!) 142/84   Wt Readings from Last 3 Encounters:  08/10/20 209 lb 12.8 oz (95.2 kg)  03/05/20 203 lb (92.1 kg)  02/02/19 203 lb 12.8 oz (92.4 kg)    Physical Exam Vitals reviewed.  Constitutional:      Appearance: She is well-developed and well-nourished.  Eyes:     Conjunctiva/sclera: Conjunctivae normal.  Cardiovascular:     Rate and Rhythm: Normal rate and regular rhythm.     Pulses: Normal pulses.  Heart sounds: Normal heart sounds.  Pulmonary:     Effort: Pulmonary effort is normal.     Breath sounds: Normal breath sounds. No wheezing, rhonchi or rales.  Musculoskeletal:     Right lower leg: No edema.     Left lower leg: No edema.  Skin:    General: Skin is warm and dry.  Neurological:     Mental Status: She is alert.  Psychiatric:        Mood and Affect: Mood and affect normal.        Speech: Speech normal.        Behavior: Behavior normal.        Thought Content: Thought content normal.        Assessment & Plan:   Problem List Items Addressed This Visit      Cardiovascular and Mediastinum   Hypertension - Primary    Uncontrolled. Increase amlodipine to 10mg  with close vigilance for leg swelling. Opted not to increase metoprolol 50mg  due to prior dizzines from regimen. If amlodipine causes swelling, we  discussed trial of hctz with BP goal closer to 130/80.      Relevant Medications   amLODipine (NORVASC) 10 MG tablet   Other Relevant Orders   TSH   CBC with Differential/Platelet   Comprehensive metabolic panel   Hemoglobin A1c   Lipid panel   VITAMIN D 25 Hydroxy (Vit-D Deficiency, Fractures)     Other   Hyperlipemia    Anticipate controlled. Pending lipid panel. Continue pravachol 40mg .       Relevant Medications   amLODipine (NORVASC) 10 MG tablet    Other Visit Diagnoses    Essential hypertension       Relevant Medications   amLODipine (NORVASC) 10 MG tablet       I have changed Lynell L. Thornhill's amLODipine. I am also having her maintain her VITAMIN D PO, metoprolol succinate, and pravastatin.   Meds ordered this encounter  Medications  . amLODipine (NORVASC) 10 MG tablet    Sig: Take 1 tablet (10 mg total) by mouth daily.    Dispense:  90 tablet    Refill:  1    Order Specific Question:   Supervising Provider    Answer:   Crecencio Mc [[id number]]    Return precautions given.   Risks, benefits, and alternatives of the medications and treatment plan prescribed today were discussed, and patient expressed understanding.   Education regarding symptom management and diagnosis given to patient on AVS.  Continue to follow with [name], FNP for routine health maintenance.   Alicia Ramos and I agreed with plan.   [name], FNP

## 2020-08-10 NOTE — Patient Instructions (Addendum)
Get Shingrex ( shingles) vaccine.   Increase amlodipine to 10mg  with close attention to leg swelling  Goal of Blood pressure is less than 130/80. Call me if in one week on new regimen doesn't result in this.   Please call  and schedule your 3D mammogram, bone density scan as discussed.   [place]  [place], [place]

## 2020-08-13 ENCOUNTER — Ambulatory Visit: Payer: Medicare HMO | Admitting: Family

## 2020-08-14 ENCOUNTER — Other Ambulatory Visit: Payer: Self-pay

## 2020-08-14 ENCOUNTER — Other Ambulatory Visit: Payer: Self-pay | Admitting: Family

## 2020-08-14 ENCOUNTER — Telehealth: Payer: Self-pay

## 2020-08-14 DIAGNOSIS — Z13820 Encounter for screening for osteoporosis: Secondary | ICD-10-CM

## 2020-08-14 DIAGNOSIS — Z1231 Encounter for screening mammogram for malignant neoplasm of breast: Secondary | ICD-10-CM

## 2020-08-14 DIAGNOSIS — E119 Type 2 diabetes mellitus without complications: Secondary | ICD-10-CM

## 2020-08-14 DIAGNOSIS — I10 Essential (primary) hypertension: Secondary | ICD-10-CM

## 2020-08-14 DIAGNOSIS — E785 Hyperlipidemia, unspecified: Secondary | ICD-10-CM

## 2020-08-14 MED ORDER — ROSUVASTATIN CALCIUM 10 MG PO TABS: 10.0000 mg | ORAL_TABLET | Freq: Every day | ORAL | 3 refills | Status: DC

## 2020-08-14 MED ORDER — METFORMIN HCL ER 500 MG PO TB24: 500.0000 mg | ORAL_TABLET | Freq: Every day | ORAL | 1 refills | Status: DC

## 2020-08-14 NOTE — Progress Notes (Signed)
Called and spoke with the patient and gave lab results and she understood.  Ordered medications and labs and scheduled a lab appointment to recheck in 6 weeks.  Alicia Ramos,cma

## 2020-08-15 NOTE — Telephone Encounter (Signed)
Call pt Yes dexa is due  Advise her to schedule at [facility] as well

## 2020-08-17 ENCOUNTER — Encounter: Payer: Self-pay | Admitting: Family

## 2020-08-17 NOTE — Telephone Encounter (Signed)
LM that she could call [facility] to schedule DEXA scan.

## 2020-08-30 DIAGNOSIS — E119 Type 2 diabetes mellitus without complications: Secondary | ICD-10-CM | POA: Diagnosis not present

## 2020-08-30 LAB — HM DIABETES EYE EXAM

## 2020-09-04 ENCOUNTER — Other Ambulatory Visit: Payer: Self-pay

## 2020-09-04 ENCOUNTER — Ambulatory Visit
Admission: RE | Admit: 2020-09-04 | Discharge: 2020-09-04 | Disposition: A | Discharge status: Home / Self Care | Payer: Medicare HMO | Source: Ambulatory Visit | Attending: Family | Admitting: Family

## 2020-09-04 DIAGNOSIS — Z1231 Encounter for screening mammogram for malignant neoplasm of breast: Secondary | ICD-10-CM

## 2020-09-06 DIAGNOSIS — Z0100 Encounter for examination of eyes and vision without abnormal findings: Secondary | ICD-10-CM | POA: Diagnosis not present

## 2020-09-10 ENCOUNTER — Telehealth: Payer: Self-pay | Admitting: Family

## 2020-09-10 NOTE — Telephone Encounter (Signed)
Pt returning a call about mammogram results

## 2020-09-10 NOTE — Telephone Encounter (Signed)
See result note. Nina,cma  

## 2020-09-16 DIAGNOSIS — Z20822 Contact with and (suspected) exposure to covid-19: Secondary | ICD-10-CM | POA: Diagnosis not present

## 2020-09-25 ENCOUNTER — Other Ambulatory Visit: Payer: Self-pay

## 2020-09-25 ENCOUNTER — Other Ambulatory Visit (INDEPENDENT_AMBULATORY_CARE_PROVIDER_SITE_OTHER): Payer: Medicare HMO

## 2020-09-25 DIAGNOSIS — I10 Essential (primary) hypertension: Secondary | ICD-10-CM

## 2020-09-25 LAB — COMPREHENSIVE METABOLIC PANEL
ALT: 24 U/L (ref 0–35)
AST: 17 U/L (ref 0–37)
Albumin: 4.7 g/dL (ref 3.5–5.2)
Alkaline Phosphatase: 65 U/L (ref 39–117)
BUN: 24 mg/dL — ABNORMAL HIGH (ref 6–23)
CO2: 28 mEq/L (ref 19–32)
Calcium: 9.7 mg/dL (ref 8.4–10.5)
Chloride: 103 mEq/L (ref 96–112)
Creatinine, Ser: 0.88 mg/dL (ref 0.40–1.20)
GFR: 66.43 mL/min (ref >60.00)
Glucose, Bld: 148 mg/dL — ABNORMAL HIGH (ref 70–99)
Potassium: 4.6 mEq/L (ref 3.5–5.1)
Sodium: 137 mEq/L (ref 135–145)
Total Bilirubin: 0.3 mg/dL (ref 0.2–1.2)
Total Protein: 7.7 g/dL (ref 6.0–8.3)

## 2020-10-01 ENCOUNTER — Telehealth: Payer: Self-pay | Admitting: Family

## 2020-10-01 DIAGNOSIS — I10 Essential (primary) hypertension: Secondary | ICD-10-CM

## 2020-10-01 MED ORDER — HYDROCHLOROTHIAZIDE 12.5 MG PO CAPS: 12.5000 mg | ORAL_CAPSULE | Freq: Every day | ORAL | 1 refills | Status: DC

## 2020-10-01 MED ORDER — AMLODIPINE BESYLATE 5 MG PO TABS: 5.0000 mg | ORAL_TABLET | Freq: Every day | ORAL | 1 refills | Status: DC

## 2020-10-01 NOTE — Telephone Encounter (Signed)
Patient was suppose to call if she had any issues with her BP medication. Patient's ankles are starting to swell and [facility] told patient to call if she had any issues.Marland Kitchen

## 2020-10-01 NOTE — Addendum Note (Signed)
Addended by: [name] on: 10/01/2020 01:39 PM   Modules accepted: Orders

## 2020-10-01 NOTE — Telephone Encounter (Signed)
Pt states that she has been taking the 10mg  of amlodipine & for the past few weeks have had bilateral ankle swelling. She said that they are very tight & uncomfortable. She said that BP is also no lower at 151/78 & 148/80 last two days. I offered telephone visit at 12p, but pt declined. Please advise?

## 2020-10-01 NOTE — Telephone Encounter (Signed)
Call pt We discussed that this may happen Decrease amlodipine to 5mg  Start hctz 12.5mg  I have sent both to pharmacy  Order BMP lab and sch in one week Ensure she has f/u with me 4-6 weeks Advise to call if leg swelling were to be unilateral or she develops SOB. Advise goal of BP < 130/80 and if not there in one week time to call office

## 2020-10-01 NOTE — Telephone Encounter (Signed)
I called patient & she is scheduled for labs in one week. She has f/u appointment scheduled 4/20.

## 2020-10-05 ENCOUNTER — Telehealth: Payer: Self-pay | Admitting: *Deleted

## 2020-10-05 DIAGNOSIS — I10 Essential (primary) hypertension: Secondary | ICD-10-CM

## 2020-10-05 NOTE — Telephone Encounter (Signed)
Please place future orders for lab appt.  

## 2020-10-09 ENCOUNTER — Other Ambulatory Visit: Payer: Self-pay

## 2020-10-09 ENCOUNTER — Other Ambulatory Visit (INDEPENDENT_AMBULATORY_CARE_PROVIDER_SITE_OTHER): Payer: Medicare HMO

## 2020-10-09 DIAGNOSIS — I10 Essential (primary) hypertension: Secondary | ICD-10-CM

## 2020-10-09 LAB — COMPREHENSIVE METABOLIC PANEL
ALT: 20 U/L (ref 0–35)
AST: 15 U/L (ref 0–37)
Albumin: 4.4 g/dL (ref 3.5–5.2)
Alkaline Phosphatase: 62 U/L (ref 39–117)
BUN: 21 mg/dL (ref 6–23)
CO2: 27 mEq/L (ref 19–32)
Calcium: 9.6 mg/dL (ref 8.4–10.5)
Chloride: 97 mEq/L (ref 96–112)
Creatinine, Ser: 0.9 mg/dL (ref 0.40–1.20)
GFR: 64.64 mL/min (ref >60.00)
Glucose, Bld: 98 mg/dL (ref 70–99)
Potassium: 4 mEq/L (ref 3.5–5.1)
Sodium: 133 mEq/L — ABNORMAL LOW (ref 135–145)
Total Bilirubin: 0.4 mg/dL (ref 0.2–1.2)
Total Protein: 7.4 g/dL (ref 6.0–8.3)

## 2020-10-10 ENCOUNTER — Other Ambulatory Visit: Payer: Self-pay

## 2020-10-10 DIAGNOSIS — E871 Hypo-osmolality and hyponatremia: Secondary | ICD-10-CM

## 2020-10-22 ENCOUNTER — Other Ambulatory Visit (INDEPENDENT_AMBULATORY_CARE_PROVIDER_SITE_OTHER): Payer: Medicare HMO

## 2020-10-22 ENCOUNTER — Other Ambulatory Visit: Payer: Self-pay

## 2020-10-22 DIAGNOSIS — E871 Hypo-osmolality and hyponatremia: Secondary | ICD-10-CM | POA: Diagnosis not present

## 2020-10-22 LAB — BASIC METABOLIC PANEL
BUN: 18 mg/dL (ref 6–23)
CO2: 27 mEq/L (ref 19–32)
Calcium: 9.8 mg/dL (ref 8.4–10.5)
Chloride: 99 mEq/L (ref 96–112)
Creatinine, Ser: 0.86 mg/dL (ref 0.40–1.20)
GFR: 68.25 mL/min (ref >60.00)
Glucose, Bld: 145 mg/dL — ABNORMAL HIGH (ref 70–99)
Potassium: 4.7 mEq/L (ref 3.5–5.1)
Sodium: 135 mEq/L (ref 135–145)

## 2020-10-23 ENCOUNTER — Other Ambulatory Visit: Payer: Self-pay | Admitting: Family

## 2020-11-14 ENCOUNTER — Other Ambulatory Visit: Payer: Self-pay

## 2020-11-14 ENCOUNTER — Ambulatory Visit (INDEPENDENT_AMBULATORY_CARE_PROVIDER_SITE_OTHER): Payer: Medicare HMO | Admitting: Family

## 2020-11-14 ENCOUNTER — Encounter: Payer: Self-pay | Admitting: Family

## 2020-11-14 DIAGNOSIS — E785 Hyperlipidemia, unspecified: Secondary | ICD-10-CM | POA: Diagnosis not present

## 2020-11-14 DIAGNOSIS — E119 Type 2 diabetes mellitus without complications: Secondary | ICD-10-CM | POA: Diagnosis not present

## 2020-11-14 DIAGNOSIS — I10 Essential (primary) hypertension: Secondary | ICD-10-CM | POA: Diagnosis not present

## 2020-11-14 LAB — COMPREHENSIVE METABOLIC PANEL
ALT: 20 U/L (ref 0–35)
AST: 19 U/L (ref 0–37)
Albumin: 4.6 g/dL (ref 3.5–5.2)
Alkaline Phosphatase: 61 U/L (ref 39–117)
BUN: 20 mg/dL (ref 6–23)
CO2: 27 mEq/L (ref 19–32)
Calcium: 10 mg/dL (ref 8.4–10.5)
Chloride: 98 mEq/L (ref 96–112)
Creatinine, Ser: 0.94 mg/dL (ref 0.40–1.20)
GFR: 61.31 mL/min (ref >60.00)
Glucose, Bld: 138 mg/dL — ABNORMAL HIGH (ref 70–99)
Potassium: 4.4 mEq/L (ref 3.5–5.1)
Sodium: 135 mEq/L (ref 135–145)
Total Bilirubin: 0.5 mg/dL (ref 0.2–1.2)
Total Protein: 7.7 g/dL (ref 6.0–8.3)

## 2020-11-14 LAB — LIPID PANEL
Cholesterol: 149 mg/dL (ref 0–200)
HDL: 51.1 mg/dL (ref >39.00)
NonHDL: 98.19
Total CHOL/HDL Ratio: 3
Triglycerides: 224 mg/dL — ABNORMAL HIGH (ref 0.0–149.0)
VLDL: 44.8 mg/dL — ABNORMAL HIGH (ref 0.0–40.0)

## 2020-11-14 LAB — POCT GLYCOSYLATED HEMOGLOBIN (HGB A1C): Hemoglobin A1C: 6.6 % — AB (ref 4.0–5.6)

## 2020-11-14 LAB — MICROALBUMIN / CREATININE URINE RATIO
Creatinine,U: 107.7 mg/dL
Microalb Creat Ratio: 1.1 mg/g (ref 0.0–30.0)
Microalb, Ur: 1.2 mg/dL (ref 0.0–1.9)

## 2020-11-14 LAB — LDL CHOLESTEROL, DIRECT: Direct LDL: 83 mg/dL

## 2020-11-14 MED ORDER — CARVEDILOL 3.125 MG PO TABS: 3.1250 mg | ORAL_TABLET | Freq: Two times a day (BID) | ORAL | 3 refills | Status: DC

## 2020-11-14 MED ORDER — AMLODIPINE BESYLATE 5 MG PO TABS
5.0000 mg | ORAL_TABLET | Freq: Every day | ORAL | 1 refills | Status: DC
Start: 2020-11-14 — End: 2021-08-20

## 2020-11-14 NOTE — Assessment & Plan Note (Signed)
Controlled. Due to BLE edema, decrease amlodipine to 5mg . Stop hctz to urinary frequency. Unable to take ACE -I ( cough) or ARB ( hyperkalemia) . Stop metoprolol and start coreg 3.125mg  bid. Close follow up

## 2020-11-14 NOTE — Assessment & Plan Note (Addendum)
Well controlled. Continue metformin 500mg  qd. Unable to take ACE -I ( cough) or ARB ( hyperkalemia)

## 2020-11-14 NOTE — Assessment & Plan Note (Signed)
Previously uncontrolled. Pending lipid panel. Continue crestor 10mg  for now

## 2020-11-14 NOTE — Patient Instructions (Addendum)
Start 5mg  amlodipine, a decrease from 10mg  STOP hctz 12.5mg  STOP metoprolol 50mg    START coreg 3.125mg  twice per day with meals  It is imperative that you are seen AT least twice per year for labs and monitoring. Monitor blood pressure at home and me 5-6 reading on separate days. Goal is less than 120/80, based on newest guidelines, however we certainly want to be less than 130/80;  if persistently higher, please make sooner follow up appointment so we can recheck you blood pressure and manage/ adjust medications.  Nice to see you!

## 2020-11-14 NOTE — Progress Notes (Signed)
Subjective:    Patient ID: Alicia Ramos, female    DOB: 16-Sep-1950, 70 y.o.   MRN: [id number]  CC: Alicia Ramos is a 70 y.o. female who presents today for follow up.   HPI: Feels well today.   HTN- compliant with hctz however urinary frequency is bothersome. On 10mg  amlodipine she has noticed BLE edema around ankles. Worse in left ankle. Compliant with metorpolol.  No cp, dizziness.   HLD- compliant with crestor 10mg   DM- compliant with metformin 500mg  qd  Mammogram UTD     HISTORY:  Past Medical History:  Diagnosis Date  . Esophageal ulcer 2000   Required blood transfusion  . HTN (hypertension)   . Hyperlipidemia    Tried statin but had severe myalgia  . Shingles 2001   Left chest  . Skin cancer 2018   left shoulder  . Vaginal delivery    x1   Past Surgical History:  Procedure Laterality Date  . ABDOMINAL HYSTERECTOMY     no cervix seen on pelvic exam; 12/2016, Alicia Ramos  . COLONOSCOPY WITH PROPOFOL N/A 07/15/2017   Procedure: COLONOSCOPY WITH PROPOFOL;  Surgeon: Alicia Silvas, MD;  Location: [facility] ENDOSCOPY;  Service: Endoscopy;  Laterality: N/A;   Family History  Problem Relation Age of Onset  . Hypertension Mother   . Lung cancer Father   . Throat cancer Father   . Hypertension Sister   . Breast cancer Paternal Aunt     Allergies: Ace inhibitors, Losartan, and Statins Current Outpatient Medications on File Prior to Visit  Medication Sig Dispense Refill  . metFORMIN (GLUCOPHAGE XR) 500 MG 24 hr tablet Take 1 tablet (500 mg total) by mouth daily with breakfast. 90 tablet 1  . rosuvastatin (CRESTOR) 10 MG tablet Take 1 tablet (10 mg total) by mouth daily. 90 tablet 3  . VITAMIN D PO Take 800 mg by mouth.     No current facility-administered medications on file prior to visit.    Social History   Tobacco Use  . Smoking status: Former Smoker    Packs/day: 1.00    Years: 39.00    Pack years: 39.00    Quit date: 08/01/2003    Years since quitting:  17.3  . Smokeless tobacco: Never Used  Substance Use Topics  . Alcohol use: Yes    Comment: Occasional    Review of Systems  Constitutional: Negative for chills and fever.  Respiratory: Negative for cough and shortness of breath.   Cardiovascular: Positive for leg swelling. Negative for chest pain and palpitations.  Gastrointestinal: Negative for nausea and vomiting.      Objective:    BP 128/74   Pulse 82   Temp 97.6 F (36.4 C)   Ht 5\' 8"  (1.727 m)   Wt 206 lb (93.4 kg)   SpO2 98%   BMI 31.32 kg/m  BP Readings from Last 3 Encounters:  11/14/20 128/74  08/10/20 (!) 142/88  03/03/19 124/74   Wt Readings from Last 3 Encounters:  11/14/20 206 lb (93.4 kg)  08/10/20 209 lb 12.8 oz (95.2 kg)  03/05/20 203 lb (92.1 kg)    Physical Exam Vitals reviewed.  Constitutional:      Appearance: She is well-developed.  Eyes:     Conjunctiva/sclera: Conjunctivae normal.  Cardiovascular:     Rate and Rhythm: Normal rate and regular rhythm.     Pulses: Normal pulses.     Heart sounds: Normal heart sounds.     Comments: Trace BLE pedal  edema, slightly worse left ankle than right. No palpable cords or masses. No erythema or increased warmth. No asymmetry in calf size when compared bilaterally LE hair growth symmetric and present. No discoloration or varicosities noted. LE warm and palpable pedal pulses.  Pulmonary:     Effort: Pulmonary effort is normal.     Breath sounds: Normal breath sounds. No wheezing, rhonchi or rales.  Skin:    General: Skin is warm and dry.  Neurological:     Mental Status: She is alert.  Psychiatric:        Speech: Speech normal.        Behavior: Behavior normal.        Thought Content: Thought content normal.        Assessment & Plan:   Problem List Items Addressed This Visit      Cardiovascular and Mediastinum   Hypertension    Controlled. Due to BLE edema, decrease amlodipine to 5mg . Stop hctz to urinary frequency. Unable to take ACE -I  ( cough) or ARB ( hyperkalemia) . Stop metoprolol and start coreg 3.125mg  bid. Close follow up       Relevant Medications   amLODipine (NORVASC) 5 MG tablet   carvedilol (COREG) 3.125 MG tablet     Endocrine   Diabetes mellitus without complication (HCC)    Well controlled. Continue metformin 500mg  qd. Unable to take ACE -I ( cough) or ARB ( hyperkalemia)       Relevant Orders   Lipid panel   Comprehensive metabolic panel   Microalbumin / creatinine urine ratio   POCT HgB A1C     Other   Hyperlipemia    Previously uncontrolled. Pending lipid panel. Continue crestor 10mg  for now      Relevant Medications   amLODipine (NORVASC) 5 MG tablet   carvedilol (COREG) 3.125 MG tablet    Other Visit Diagnoses    Essential hypertension       Relevant Medications   amLODipine (NORVASC) 5 MG tablet   carvedilol (COREG) 3.125 MG tablet       I have discontinued Alicia Ramos's hydrochlorothiazide and metoprolol succinate. I am also having her start on carvedilol. Additionally, I am having her maintain her VITAMIN D PO, rosuvastatin, metFORMIN, and amLODipine.   Meds ordered this encounter  Medications  . amLODipine (NORVASC) 5 MG tablet    Sig: Take 1 tablet (5 mg total) by mouth daily.    Dispense:  90 tablet    Refill:  1    Order Specific Question:   Supervising Provider    Answer:   Alicia Ramos [[id number]]  . carvedilol (COREG) 3.125 MG tablet    Sig: Take 1 tablet (3.125 mg total) by mouth 2 (two) times daily with a meal.    Dispense:  60 tablet    Refill:  3    Order Specific Question:   Supervising Provider    Answer:   Alicia Ramos [[id number]]    Return precautions given.   Risks, benefits, and alternatives of the medications and treatment plan prescribed today were discussed, and patient expressed understanding.   Education regarding symptom management and diagnosis given to patient on AVS.  Continue to follow with Alicia Hawthorne, FNP for routine health  maintenance.   Alicia Ramos and I agreed with plan.   Alicia Paris, FNP

## 2020-11-23 ENCOUNTER — Other Ambulatory Visit: Payer: Self-pay

## 2020-11-23 MED ORDER — ROSUVASTATIN CALCIUM 20 MG PO TABS: 20.0000 mg | ORAL_TABLET | Freq: Every day | ORAL | 3 refills | Status: DC

## 2020-12-05 ENCOUNTER — Other Ambulatory Visit: Payer: Self-pay

## 2020-12-05 ENCOUNTER — Encounter: Payer: Self-pay | Admitting: Family

## 2020-12-05 ENCOUNTER — Ambulatory Visit (INDEPENDENT_AMBULATORY_CARE_PROVIDER_SITE_OTHER): Payer: Medicare HMO | Admitting: Family

## 2020-12-05 VITALS — BP 146/70 | HR 82 | Temp 97.8°F | Ht 68.0 in | Wt 206.4 lb

## 2020-12-05 DIAGNOSIS — I10 Essential (primary) hypertension: Secondary | ICD-10-CM | POA: Diagnosis not present

## 2020-12-05 DIAGNOSIS — E785 Hyperlipidemia, unspecified: Secondary | ICD-10-CM | POA: Diagnosis not present

## 2020-12-05 MED ORDER — CARVEDILOL 6.25 MG PO TABS: 6.2500 mg | ORAL_TABLET | Freq: Two times a day (BID) | ORAL | 1 refills | Status: DC

## 2020-12-05 NOTE — Assessment & Plan Note (Addendum)
Uncontrolled. Increase coreg to 6.25mg  bid. She will call me and let me know if BP doesn't return to prior baseline. Continue amlodipine 5mg  EKG NSR. No acute ischemia nor prior EKG to compare too. EKG reviewed with supervising, Dr Deborra Medina, and she and I jointly agreed NSR. Advised patient as she is asymptomatic, she may continue to engage in hiking, canoeing with senior center.

## 2020-12-05 NOTE — Patient Instructions (Signed)
Increase coreg to 6.25mg  twice daily It is imperative that you are seen AT least twice per year for labs and monitoring. Monitor blood pressure at home and me 5-6 reading on separate days. Goal is less than 120/80, based on newest guidelines, however we certainly want to be less than 130/80;  if persistently higher, please make sooner follow up appointment so we can recheck you blood pressure and manage/ adjust medications.  Referral to cardiology Let us know if you dont hear back within a week in regards to an appointment being scheduled.

## 2020-12-05 NOTE — Assessment & Plan Note (Signed)
Presume improved. Will check lipid panel at follow up. Continue crestor 20mg 

## 2020-12-05 NOTE — Progress Notes (Signed)
Subjective:    Patient ID: Alicia Ramos, female    DOB: Dec 27, 1950, 70 y.o.   MRN: [id number]  CC: RASHI GRANIER is a 70 y.o. female who presents today for follow up.   HPI: Participating in 3 mile hike with [employer] [facility], senior activites center which requires a form from medical provider for her to participate.   She frequently walks trails at [facility], approx 2 miles. She does yoga twice per week. She canoes with son. Excercises without CP, SOB  Leg swelling resolved on amlodipine 5mg . She is compliant with coreg 3.125mg  BID . BP at home 140/80.   HLD- compliant with increased crestor 20mg   Lab scheduled 12/28/20 to recheck LFTs    HISTORY:  Past Medical History:  Diagnosis Date  . Esophageal ulcer 2000   Required blood transfusion  . HTN (hypertension)   . Hyperlipidemia    Tried statin but had severe myalgia  . Shingles 2001   Left chest  . Skin cancer 2018   left shoulder  . Vaginal delivery    x1   Past Surgical History:  Procedure Laterality Date  . ABDOMINAL HYSTERECTOMY     no cervix seen on pelvic exam; 12/2016, [place] [place]  . COLONOSCOPY WITH PROPOFOL N/A 07/15/2017   Procedure: COLONOSCOPY WITH PROPOFOL;  Surgeon: Manya Silvas, MD;  Location: [facility] ENDOSCOPY;  Service: Endoscopy;  Laterality: N/A;   Family History  Problem Relation Age of Onset  . Hypertension Mother   . Lung cancer Father 91  . Throat cancer Father   . Hypertension Sister   . Breast cancer Paternal Aunt   . Heart disease Maternal Uncle   . Heart disease Maternal Aunt     Allergies: Ace inhibitors, Losartan, and Statins Current Outpatient Medications on File Prior to Visit  Medication Sig Dispense Refill  . amLODipine (NORVASC) 5 MG tablet Take 1 tablet (5 mg total) by mouth daily. 90 tablet 1  . metFORMIN (GLUCOPHAGE XR) 500 MG 24 hr tablet Take 1 tablet (500 mg total) by mouth daily with breakfast. 90 tablet 1  . rosuvastatin (CRESTOR) 20 MG tablet Take 1 tablet (20 mg  total) by mouth daily. 90 tablet 3  . VITAMIN D PO Take 800 mg by mouth.     No current facility-administered medications on file prior to visit.    Social History   Tobacco Use  . Smoking status: Former Smoker    Packs/day: 1.00    Years: 39.00    Pack years: 39.00    Quit date: 08/01/2003    Years since quitting: 17.3  . Smokeless tobacco: Never Used  Substance Use Topics  . Alcohol use: Yes    Comment: Occasional    Review of Systems    Objective:    BP (!) 146/70 (BP Location: Left Arm, Patient Position: Sitting, Cuff Size: Large)   Pulse 82   Temp 97.8 F (36.6 C) (Oral)   Ht 5\' 8"  (1.727 m)   Wt 206 lb 6.4 oz (93.6 kg)   SpO2 97%   BMI 31.38 kg/m  BP Readings from Last 3 Encounters:  12/05/20 (!) 146/70  11/14/20 128/74  08/10/20 (!) 142/88   Wt Readings from Last 3 Encounters:  12/05/20 206 lb 6.4 oz (93.6 kg)  11/14/20 206 lb (93.4 kg)  08/10/20 209 lb 12.8 oz (95.2 kg)    Physical Exam Vitals reviewed.  Constitutional:      Appearance: She is well-developed.  Eyes:  Conjunctiva/sclera: Conjunctivae normal.  Cardiovascular:     Rate and Rhythm: Normal rate and regular rhythm.     Pulses: Normal pulses.     Heart sounds: Normal heart sounds.  Pulmonary:     Effort: Pulmonary effort is normal.     Breath sounds: Normal breath sounds. No wheezing, rhonchi or rales.  Musculoskeletal:     Right lower leg: No edema.     Left lower leg: No edema.  Skin:    General: Skin is warm and dry.  Neurological:     Mental Status: She is alert.  Psychiatric:        Speech: Speech normal.        Behavior: Behavior normal.        Thought Content: Thought content normal.        Assessment & Plan:   Problem List Items Addressed This Visit      Cardiovascular and Mediastinum   Hypertension    Uncontrolled. Increase coreg to 6.25mg  bid. She will call me and let me know if BP doesn't return to prior baseline. Continue amlodipine 5mg  EKG NSR. No acute  ischemia nor prior EKG to compare too. EKG reviewed with supervising, Dr Deborra Medina, and she and I jointly agreed NSR. Advised patient as she is asymptomatic, she may continue to engage in hiking, canoeing with senior center.        Relevant Medications   carvedilol (COREG) 6.25 MG tablet     Other   Hyperlipemia    Presume improved. Will check lipid panel at follow up. Continue crestor 20mg       Relevant Medications   carvedilol (COREG) 6.25 MG tablet    Other Visit Diagnoses    Essential hypertension    -  Primary   Relevant Medications   carvedilol (COREG) 6.25 MG tablet   Other Relevant Orders   Comprehensive metabolic panel   EKG 09-NATF (Completed)   Ambulatory referral to Cardiology       I have changed Dawsyn L. Deyarmin's carvedilol. I am also having her maintain her VITAMIN D PO, metFORMIN, amLODipine, and rosuvastatin.   Meds ordered this encounter  Medications  . carvedilol (COREG) 6.25 MG tablet    Sig: Take 1 tablet (6.25 mg total) by mouth 2 (two) times daily with a meal.    Dispense:  90 tablet    Refill:  1    Order Specific Question:   Supervising Provider    Answer:   Crecencio Mc [[id number]]    Return precautions given.   Risks, benefits, and alternatives of the medications and treatment plan prescribed today were discussed, and patient expressed understanding.   Education regarding symptom management and diagnosis given to patient on AVS.  Continue to follow with [name], FNP for routine health maintenance.   Alicia Ramos and I agreed with plan.   [name], FNP

## 2020-12-14 ENCOUNTER — Telehealth: Payer: Self-pay | Admitting: Family

## 2020-12-14 DIAGNOSIS — L237 Allergic contact dermatitis due to plants, except food: Secondary | ICD-10-CM | POA: Diagnosis not present

## 2020-12-14 NOTE — Telephone Encounter (Signed)
Just FYI. I called patient & she stated that her poison ivy has covered her stomach, wrists & has spread down to legs very close to her genital area as well. She has been using zinc to treat. I recommended that she go to UC ASAP bc she may need the oral prednisone at this point. I did also recommend washing & scrubbing area on stomach, wrists & legs with some Dawn to get off oils. I recommended that she do this in the future as well. Patient will go to UC near [facility] due to that being closest to her. She will update [place] on how she is doing.

## 2020-12-14 NOTE — Telephone Encounter (Signed)
Patient called in state that she has poisin  ivy need something for it

## 2020-12-14 NOTE — Telephone Encounter (Signed)
noted 

## 2020-12-28 ENCOUNTER — Other Ambulatory Visit (INDEPENDENT_AMBULATORY_CARE_PROVIDER_SITE_OTHER): Payer: Medicare HMO

## 2020-12-28 ENCOUNTER — Other Ambulatory Visit: Payer: Self-pay

## 2020-12-28 DIAGNOSIS — I10 Essential (primary) hypertension: Secondary | ICD-10-CM | POA: Diagnosis not present

## 2020-12-31 LAB — COMPREHENSIVE METABOLIC PANEL
ALT: 19 U/L (ref 0–35)
AST: 17 U/L (ref 0–37)
Albumin: 4.5 g/dL (ref 3.5–5.2)
Alkaline Phosphatase: 58 U/L (ref 39–117)
BUN: 20 mg/dL (ref 6–23)
CO2: 27 mEq/L (ref 19–32)
Calcium: 9.4 mg/dL (ref 8.4–10.5)
Chloride: 99 mEq/L (ref 96–112)
Creatinine, Ser: 0.88 mg/dL (ref 0.40–1.20)
GFR: 66.3 mL/min (ref >60.00)
Glucose, Bld: 173 mg/dL — ABNORMAL HIGH (ref 70–99)
Potassium: 4.7 mEq/L (ref 3.5–5.1)
Sodium: 136 mEq/L (ref 135–145)
Total Bilirubin: 0.5 mg/dL (ref 0.2–1.2)
Total Protein: 7.2 g/dL (ref 6.0–8.3)

## 2021-01-01 DIAGNOSIS — L820 Inflamed seborrheic keratosis: Secondary | ICD-10-CM | POA: Diagnosis not present

## 2021-01-01 DIAGNOSIS — L821 Other seborrheic keratosis: Secondary | ICD-10-CM | POA: Diagnosis not present

## 2021-01-01 DIAGNOSIS — D2271 Melanocytic nevi of right lower limb, including hip: Secondary | ICD-10-CM | POA: Diagnosis not present

## 2021-01-01 DIAGNOSIS — B351 Tinea unguium: Secondary | ICD-10-CM | POA: Diagnosis not present

## 2021-01-01 DIAGNOSIS — Z85828 Personal history of other malignant neoplasm of skin: Secondary | ICD-10-CM | POA: Diagnosis not present

## 2021-01-01 DIAGNOSIS — L538 Other specified erythematous conditions: Secondary | ICD-10-CM | POA: Diagnosis not present

## 2021-01-01 DIAGNOSIS — M71371 Other bursal cyst, right ankle and foot: Secondary | ICD-10-CM | POA: Diagnosis not present

## 2021-01-01 DIAGNOSIS — D225 Melanocytic nevi of trunk: Secondary | ICD-10-CM | POA: Diagnosis not present

## 2021-01-22 ENCOUNTER — Encounter: Payer: Self-pay | Admitting: Cardiology

## 2021-01-22 ENCOUNTER — Ambulatory Visit: Payer: Medicare HMO | Admitting: Cardiology

## 2021-01-22 ENCOUNTER — Other Ambulatory Visit: Payer: Self-pay

## 2021-01-22 VITALS — BP 126/80 | HR 89 | Ht 69.0 in | Wt 205.0 lb

## 2021-01-22 DIAGNOSIS — I10 Essential (primary) hypertension: Secondary | ICD-10-CM | POA: Diagnosis not present

## 2021-01-22 DIAGNOSIS — E782 Mixed hyperlipidemia: Secondary | ICD-10-CM | POA: Diagnosis not present

## 2021-01-22 DIAGNOSIS — I2510 Atherosclerotic heart disease of native coronary artery without angina pectoris: Secondary | ICD-10-CM | POA: Diagnosis not present

## 2021-01-22 DIAGNOSIS — R9431 Abnormal electrocardiogram [ECG] [EKG]: Secondary | ICD-10-CM

## 2021-01-22 MED ORDER — FENOFIBRATE 145 MG PO TABS: 145.0000 mg | ORAL_TABLET | Freq: Every day | ORAL | 3 refills | Status: DC

## 2021-01-22 MED ORDER — ASPIRIN EC 81 MG PO TBEC: 81.0000 mg | DELAYED_RELEASE_TABLET | Freq: Every day | ORAL | Status: AC

## 2021-01-22 NOTE — Progress Notes (Signed)
Cardiology Office Note:    Date:  01/22/2021   ID:  Alicia Ramos, DOB [birth year], MRN [id number]  PCP:  [name], FNP   [facility] Providers Cardiologist:  Kate Sable, MD     Referring MD: [name], FNP   Chief Complaint  Patient presents with   New Patient (Initial Visit)    Referred by PCP for Hypertension. Meds reviewed verbally with patient.     History of Present Illness:    Alicia Ramos is a 70 y.o. female with a hx of hypertension, hyperlipidemia, diabetes, former smoker x30+ years who presents due to hypertension and abnormal EKG.  Patient saw her primary care provider last month for regular visit.  EKG was obtained and noted to be abnormal.  She had a screening chest CT due to prior history of smoking in 2019 showing three-vessel coronary artery disease.  She denies chest pain but states having shortness of breath when she overexerts herself.  Attributes shortness of breath to deconditioning and weight gain.  Denies any history of heart attacks.  Takes all medications as prescribed.  Has statin allergies, currently tolerating 20 mg Crestor as prescribed.  Past Medical History:  Diagnosis Date   Esophageal ulcer 2000   Required blood transfusion   HTN (hypertension)    Hyperlipidemia    Tried statin but had severe myalgia   Shingles 2001   Left chest   Skin cancer 2018   left shoulder   Vaginal delivery    x1    Past Surgical History:  Procedure Laterality Date   ABDOMINAL HYSTERECTOMY     no cervix seen on pelvic exam; 12/2016, [place]   COLONOSCOPY WITH PROPOFOL N/A 07/15/2017   Procedure: COLONOSCOPY WITH PROPOFOL;  Surgeon: Manya Silvas, MD;  Location: [facility] ENDOSCOPY;  Service: Endoscopy;  Laterality: N/A;    Current Medications: Current Meds  Medication Sig   amLODipine (NORVASC) 5 MG tablet Take 1 tablet (5 mg total) by mouth daily.   aspirin EC 81 MG tablet Take 1 tablet (81 mg total) by mouth daily. Swallow  whole.   carvedilol (COREG) 6.25 MG tablet Take 1 tablet (6.25 mg total) by mouth 2 (two) times daily with a meal.   fenofibrate (TRICOR) 145 MG tablet Take 1 tablet (145 mg total) by mouth daily.   metFORMIN (GLUCOPHAGE XR) 500 MG 24 hr tablet Take 1 tablet (500 mg total) by mouth daily with breakfast.   rosuvastatin (CRESTOR) 20 MG tablet Take 1 tablet (20 mg total) by mouth daily.   VITAMIN D PO Take 800 mg by mouth.     Allergies:   Ace inhibitors, Losartan, and Statins   Social History   Socioeconomic History   Marital status: Divorced    Spouse name: Not on file   Number of children: Not on file   Years of education: Not on file   Highest education level: Not on file  Occupational History   Not on file  Tobacco Use   Smoking status: Former    Packs/day: 1.00    Years: 39.00    Pack years: 39.00    Types: Cigarettes    Quit date: 08/01/2003    Years since quitting: 17.4   Smokeless tobacco: Never  Substance and Sexual Activity   Alcohol use: Yes    Comment: Occasional   Drug use: Not on file   Sexual activity: Not Currently  Other Topics Concern   Not on file  Social History Narrative  Regular Exercise -  Yes   Lives in [place] w/son         Social Determinants of Health   Financial Resource Strain: Low Risk    Difficulty of Paying Living Expenses: Not hard at all  Food Insecurity: No Food Insecurity   Worried About Charity fundraiser in the Last Year: Never true   Arboriculturist in the Last Year: Never true  Transportation Needs: No Transportation Needs   Lack of Transportation (Medical): No   Lack of Transportation (Non-Medical): No  Physical Activity: Not on file  Stress: No Stress Concern Present   Feeling of Stress : Not at all  Social Connections: Unknown   Frequency of Communication with Friends and Family: More than three times a week   Frequency of Social Gatherings with Friends and Family: More than three times a week   Attends Religious  Services: Not on Electrical engineer or Organizations: Not on file   Attends Archivist Meetings: Not on file   Marital Status: Not on file     Family History: The patient's family history includes Breast cancer in her paternal aunt; Heart disease in her maternal aunt and maternal uncle; Hypertension in her mother and sister; Lung cancer (age of onset: 64) in her father; Throat cancer in her father.  ROS:   Please see the history of present illness.     All other systems reviewed and are negative.  EKGs/Labs/Other Studies Reviewed:    The following studies were reviewed today:   EKG:  EKG not ordered today.   Recent Labs: 08/10/2020: Hemoglobin 13.7; Platelets 283.0; TSH 4.43 12/28/2020: ALT 19; BUN 20; Creatinine, Ser 0.88; Potassium 4.7; Sodium 136  Recent Lipid Panel    Component Value Date/Time   CHOL 149 11/14/2020 0837   TRIG 224.0 (H) 11/14/2020 0837   HDL 51.10 11/14/2020 0837   CHOLHDL 3 11/14/2020 0837   VLDL 44.8 (H) 11/14/2020 0837   LDLDIRECT 83.0 11/14/2020 0837     Risk Assessment/Calculations:          Physical Exam:    VS:  BP 126/80 (BP Location: Left Arm, Patient Position: Sitting, Cuff Size: Normal)   Pulse 89   Ht 5\' 9"  (1.753 m)   Wt 205 lb (93 kg)   SpO2 98%   BMI 30.27 kg/m     Wt Readings from Last 3 Encounters:  01/22/21 205 lb (93 kg)  12/05/20 206 lb 6.4 oz (93.6 kg)  11/14/20 206 lb (93.4 kg)     GEN:  Well nourished, well developed in no acute distress HEENT: Normal NECK: No JVD; No carotid bruits LYMPHATICS: No lymphadenopathy CARDIAC: RRR, no murmurs, rubs, gallops RESPIRATORY:  Clear to auscultation without rales, wheezing or rhonchi  ABDOMEN: Soft, non-tender, non-distended MUSCULOSKELETAL:  No edema; No deformity  SKIN: Warm and dry NEUROLOGIC:  Alert and oriented x 3 PSYCHIATRIC:  Normal affect   ASSESSMENT:    1. Coronary artery disease involving native coronary artery of native heart without  angina pectoris   2. Nonspecific abnormal electrocardiogram (ECG) (EKG)   3. Primary hypertension   4. Mixed hyperlipidemia    PLAN:    In order of problems listed above:  CAD, three-vessel coronary calcifications on chest CT 2019.  Denies chest pain.  Start aspirin 81 mg daily, continue Crestor.  Obtain echo to evaluate any wall motion abnormalities.  Patient denies chest pain, shortness of breath not consistent with angina.  If symptoms of angina were to alcohol, will consider perfusion stress test to evaluate ischemia. History of abnormal ECG at PCPs office.  EKG from PCP office reviewed by myself showing normal sinus rhythm, artifact noted in lead V4 otherwise normal ECG.  Patient made aware and reassured. Hypertension, BP controlled, continue Coreg, Norvasc. Hyperlipidemia, hypertriglyceridemia.  On Crestor , triglycerides not controlled.  Start fenofibrate for elevated triglycerides.  Repeat fasting lipid profile in 3 months.  Follow-up in 3 months after fasting lipid profile.    Medication Adjustments/Labs and Tests Ordered: Current medicines are reviewed at length with the patient today.  Concerns regarding medicines are outlined above.  Orders Placed This Encounter  Procedures   Lipid Profile   EKG 12-Lead   ECHOCARDIOGRAM COMPLETE    Meds ordered this encounter  Medications   fenofibrate (TRICOR) 145 MG tablet    Sig: Take 1 tablet (145 mg total) by mouth daily.    Dispense:  30 tablet    Refill:  3   aspirin EC 81 MG tablet    Sig: Take 1 tablet (81 mg total) by mouth daily. Swallow whole.     Patient Instructions  Medication Instructions:   Your physician has recommended you make the following change in your medication:   START Aspirin 81mg  daily (you may get this over the counter)  START Fenofibrate - 1 tablet daily   *If you need a refill on your cardiac medications before your next appointment, please call your pharmacy*   Lab Work:  Your physician  recommends that you return for FASTING lab work in: PRIOR to your next follow up visit in 3 months (Lipid panel) -  Please go to the [facility]. You will check in at the front desk to the right as you walk into the atrium. Valet Parking is offered if needed. - No appointment needed. You may go any day between 7 am and 6 pm.   Testing/Procedures:  Your physician has requested that you have an echocardiogram. Echocardiography is a painless test that uses sound waves to create images of your heart. It provides your doctor with information about the size and shape of your heart and how well your heart's chambers and valves are working. This procedure takes approximately one hour. There are no restrictions for this procedure.   Follow-Up: At [facility], you and your health needs are our priority.  As part of our continuing mission to provide you with exceptional heart care, we have created designated Provider Care Teams.  These Care Teams include your primary Cardiologist (physician) and Advanced Practice Providers (APPs -  Physician Assistants and Nurse Practitioners) who all work together to provide you with the care you need, when you need it.  We recommend signing up for the patient portal called "MyChart".  Sign up information is provided on this After Visit Summary.  MyChart is used to connect with patients for Virtual Visits (Telemedicine).  Patients are able to view lab/test results, encounter notes, upcoming appointments, etc.  Non-urgent messages can be sent to your provider as well.   To learn more about what you can do with MyChart, go to [email or web address].    Your next appointment:   3 month(s) after echo  The format for your next appointment:   In Person  Provider:   You may see Kate Sable, MD or one of the following Advanced Practice Providers on your designated Care Team:   Murray Hodgkins, NP Christell Faith, PA-C Marrianne Mood, PA-C Cadence  Kathlen Mody,  PA-C Laurann Montana, NP     Signed, Kate Sable, MD  01/22/2021 9:54 AM    [place]

## 2021-01-22 NOTE — Patient Instructions (Signed)
Medication Instructions:   Your physician has recommended you make the following change in your medication:   START Aspirin 81mg  daily (you may get this over the counter)  START Fenofibrate - 1 tablet daily   *If you need a refill on your cardiac medications before your next appointment, please call your pharmacy*   Lab Work:  Your physician recommends that you return for FASTING lab work in: PRIOR to your next follow up visit in 3 months (Lipid panel) -  Please go to the [facility]. You will check in at the front desk to the right as you walk into the atrium. Valet Parking is offered if needed. - No appointment needed. You may go any day between 7 am and 6 pm.   Testing/Procedures:  Your physician has requested that you have an echocardiogram. Echocardiography is a painless test that uses sound waves to create images of your heart. It provides your doctor with information about the size and shape of your heart and how well your heart's chambers and valves are working. This procedure takes approximately one hour. There are no restrictions for this procedure.   Follow-Up: At [facility], you and your health needs are our priority.  As part of our continuing mission to provide you with exceptional heart care, we have created designated Provider Care Teams.  These Care Teams include your primary Cardiologist (physician) and Advanced Practice Providers (APPs -  Physician Assistants and Nurse Practitioners) who all work together to provide you with the care you need, when you need it.  We recommend signing up for the patient portal called "MyChart".  Sign up information is provided on this After Visit Summary.  MyChart is used to connect with patients for Virtual Visits (Telemedicine).  Patients are able to view lab/test results, encounter notes, upcoming appointments, etc.  Non-urgent messages can be sent to your provider as well.   To learn more about what you can do with MyChart,  go to [email or web address].    Your next appointment:   3 month(s) after echo  The format for your next appointment:   In Person  Provider:   You may see Kate Sable, MD or one of the following Advanced Practice Providers on your designated Care Team:   Murray Hodgkins, NP Christell Faith, PA-C Marrianne Mood, PA-C Cadence Dayton, [place] Laurann Montana, NP

## 2021-02-06 ENCOUNTER — Other Ambulatory Visit: Payer: Self-pay | Admitting: Family

## 2021-02-06 DIAGNOSIS — E119 Type 2 diabetes mellitus without complications: Secondary | ICD-10-CM

## 2021-02-09 ENCOUNTER — Other Ambulatory Visit: Payer: Self-pay | Admitting: Family

## 2021-02-09 DIAGNOSIS — I10 Essential (primary) hypertension: Secondary | ICD-10-CM

## 2021-02-11 ENCOUNTER — Encounter: Payer: Self-pay | Admitting: Family

## 2021-02-11 ENCOUNTER — Ambulatory Visit (INDEPENDENT_AMBULATORY_CARE_PROVIDER_SITE_OTHER): Payer: Medicare HMO | Admitting: Family

## 2021-02-11 ENCOUNTER — Other Ambulatory Visit: Payer: Self-pay

## 2021-02-11 VITALS — BP 126/80 | HR 84 | Temp 97.9°F | Ht 69.02 in | Wt 203.6 lb

## 2021-02-11 DIAGNOSIS — Z780 Asymptomatic menopausal state: Secondary | ICD-10-CM | POA: Diagnosis not present

## 2021-02-11 DIAGNOSIS — I700 Atherosclerosis of aorta: Secondary | ICD-10-CM

## 2021-02-11 DIAGNOSIS — E119 Type 2 diabetes mellitus without complications: Secondary | ICD-10-CM | POA: Diagnosis not present

## 2021-02-11 DIAGNOSIS — E782 Mixed hyperlipidemia: Secondary | ICD-10-CM

## 2021-02-11 DIAGNOSIS — I10 Essential (primary) hypertension: Secondary | ICD-10-CM

## 2021-02-11 NOTE — Progress Notes (Signed)
Subjective:    Patient ID: Alicia Ramos, female    DOB: [birth year], 70 y.o.   MRN: [id number]  CC: ANNISTEN MANCHESTER is a 70 y.o. female who presents today for follow up.   HPI: Feels well today  No complaints today  Compliant with metformin 500mg  qd.   Compliant with crestor 20mg .  Consult with dr Charlestine Night 01/22/21 whom started asa 81mg , fenofibrate. Pending echo and repeat lipid panel with dr Reyne Dumas  Compliant with coreg 6.25mg  and amlodipine 5mg . No cp, sob   HISTORY:  Past Medical History:  Diagnosis Date   Esophageal ulcer 2000   Required blood transfusion   HTN (hypertension)    Hyperlipidemia    Tried statin but had severe myalgia   Shingles 2001   Left chest   Skin cancer 2018   left shoulder   Vaginal delivery    x1   Past Surgical History:  Procedure Laterality Date   ABDOMINAL HYSTERECTOMY     no cervix seen on pelvic exam; 12/2016, [facility]   COLONOSCOPY WITH PROPOFOL N/A 07/15/2017   Procedure: COLONOSCOPY WITH PROPOFOL;  Surgeon: Manya Silvas, MD;  Location: [facility] ENDOSCOPY;  Service: Endoscopy;  Laterality: N/A;   Family History  Problem Relation Age of Onset   Hypertension Mother    Lung cancer Father 59   Throat cancer Father    Hypertension Sister    Breast cancer Paternal Aunt    Heart disease Maternal Uncle    Heart disease Maternal Aunt     Allergies: Ace inhibitors, Losartan, and Statins Current Outpatient Medications on File Prior to Visit  Medication Sig Dispense Refill   amLODipine (NORVASC) 5 MG tablet Take 1 tablet (5 mg total) by mouth daily. 90 tablet 1   aspirin EC 81 MG tablet Take 1 tablet (81 mg total) by mouth daily. Swallow whole.     carvedilol (COREG) 6.25 MG tablet Take 1 tablet (6.25 mg total) by mouth 2 (two) times daily with a meal. 90 tablet 1   fenofibrate (TRICOR) 145 MG tablet Take 1 tablet (145 mg total) by mouth daily. 30 tablet 3   metFORMIN (GLUCOPHAGE-XR) 500 MG 24 hr tablet TAKE 1 TABLET BY MOUTH EVERY DAY WITH  BREAKFAST 90 tablet 1   rosuvastatin (CRESTOR) 20 MG tablet Take 1 tablet (20 mg total) by mouth daily. 90 tablet 3   VITAMIN D PO Take 800 mg by mouth.     No current facility-administered medications on file prior to visit.    Social History   Tobacco Use   Smoking status: Former    Packs/day: 1.00    Years: 39.00    Pack years: 39.00    Types: Cigarettes    Quit date: 08/01/2003    Years since quitting: 17.5   Smokeless tobacco: Never  Substance Use Topics   Alcohol use: Yes    Comment: Occasional    Review of Systems    Objective:    BP 126/80   Pulse 84   Temp 97.9 F (36.6 C)   Ht 5' 9.02" (1.753 m)   Wt 203 lb 9.6 oz (92.4 kg)   SpO2 96%   BMI 30.05 kg/m  BP Readings from Last 3 Encounters:  02/11/21 126/80  01/22/21 126/80  12/05/20 (!) 146/70   Wt Readings from Last 3 Encounters:  02/11/21 203 lb 9.6 oz (92.4 kg)  01/22/21 205 lb (93 kg)  12/05/20 206 lb 6.4 oz (93.6 kg)    Physical Exam  Assessment & Plan:   Problem List Items Addressed This Visit       Cardiovascular and Mediastinum   Atherosclerosis of aorta (HCC)    Chronic stable. Continue with asa 81mg .        Hypertension    Controlled. Continue coreg 6.25mg  and amlodipine 5mg          Endocrine   Diabetes mellitus without complication ([place])   Relevant Orders   Hemoglobin A1c     Other   Hyperlipemia    Stable. Addition of fenofibrate per cardiology, will follow. Continue crestor 20mg .        Other Visit Diagnoses     Asymptomatic menopausal state    -  Primary   Relevant Orders   DG Bone Density        I am having Anamaria L. Deroy maintain her VITAMIN D PO, amLODipine, rosuvastatin, carvedilol, fenofibrate, aspirin EC, and metFORMIN.   No orders of the defined types were placed in this encounter.   Return precautions given.   Risks, benefits, and alternatives of the medications and treatment plan prescribed today were discussed, and patient expressed  understanding.   Education regarding symptom management and diagnosis given to patient on AVS.  Continue to follow with [name], FNP for routine health maintenance.   Alicia Ramos and I agreed with plan.   [name], FNP

## 2021-02-11 NOTE — Patient Instructions (Signed)
Please call  and schedule your bone density scan as discussed.   [place]  [place], [place]

## 2021-02-11 NOTE — Assessment & Plan Note (Signed)
Controlled. Continue coreg 6.25mg  and amlodipine 5mg 

## 2021-02-11 NOTE — Assessment & Plan Note (Signed)
Chronic stable. Continue with asa 81mg .

## 2021-02-11 NOTE — Assessment & Plan Note (Addendum)
Stable. Addition of fenofibrate per cardiology, will follow. Continue crestor 20mg .

## 2021-02-14 ENCOUNTER — Other Ambulatory Visit: Payer: Self-pay | Admitting: Family

## 2021-02-14 DIAGNOSIS — I10 Essential (primary) hypertension: Secondary | ICD-10-CM

## 2021-02-18 ENCOUNTER — Other Ambulatory Visit: Payer: Self-pay

## 2021-02-18 ENCOUNTER — Other Ambulatory Visit (INDEPENDENT_AMBULATORY_CARE_PROVIDER_SITE_OTHER): Payer: Medicare HMO

## 2021-02-18 DIAGNOSIS — E119 Type 2 diabetes mellitus without complications: Secondary | ICD-10-CM | POA: Diagnosis not present

## 2021-02-18 LAB — HEMOGLOBIN A1C: Hgb A1c MFr Bld: 7.6 % — ABNORMAL HIGH (ref 4.6–6.5)

## 2021-02-19 ENCOUNTER — Other Ambulatory Visit: Payer: Self-pay

## 2021-02-19 ENCOUNTER — Telehealth: Payer: Self-pay

## 2021-02-19 DIAGNOSIS — E119 Type 2 diabetes mellitus without complications: Secondary | ICD-10-CM

## 2021-02-19 MED ORDER — METFORMIN HCL ER 500 MG PO TB24: 1000.0000 mg | ORAL_TABLET | Freq: Every day | ORAL | 1 refills | Status: DC

## 2021-02-19 NOTE — Telephone Encounter (Signed)
LMTCB for lab results.  

## 2021-03-06 ENCOUNTER — Ambulatory Visit (INDEPENDENT_AMBULATORY_CARE_PROVIDER_SITE_OTHER): Payer: Medicare HMO

## 2021-03-06 VITALS — BP 126/80 | Ht 69.0 in | Wt 203.0 lb

## 2021-03-06 DIAGNOSIS — Z0000 Encounter for general adult medical examination without abnormal findings: Secondary | ICD-10-CM

## 2021-03-06 NOTE — Progress Notes (Signed)
Subjective:   Alicia Ramos is a 70 y.o. female who presents for Medicare Annual (Subsequent) preventive examination.  Review of Systems    No ROS.  Medicare Wellness Virtual Visit.  Visual/audio telehealth visit, UTA vital signs.   See social history for additional risk factors.   Cardiac Risk Factors include: advanced age (>64mn, >>84women);hypertension;diabetes mellitus     Objective:    Today's Vitals   03/06/21 1034  BP: 126/80  Weight: 203 lb (92.1 kg)  Height: '5\' 9"'$  (1.753 m)   Body mass index is 29.98 kg/m.  Advanced Directives 03/06/2021 03/05/2020 03/03/2019  Does Patient Have a Medical Advance Directive? No No No  Does patient want to make changes to medical advance directive? No - Patient declined - -  Would patient like information on creating a medical advance directive? - No - Patient declined No - Patient declined    Current Medications (verified) Outpatient Encounter Medications as of 03/06/2021  Medication Sig   amLODipine (NORVASC) 5 MG tablet Take 1 tablet (5 mg total) by mouth daily.   aspirin EC 81 MG tablet Take 1 tablet (81 mg total) by mouth daily. Swallow whole.   carvedilol (COREG) 3.125 MG tablet TAKE 1 TABLET (3.125 MG TOTAL) BY MOUTH 2 (TWO) TIMES DAILY WITH A MEAL.   carvedilol (COREG) 6.25 MG tablet TAKE 1 TABLET BY MOUTH 2 TIMES DAILY WITH A MEAL.   fenofibrate (TRICOR) 145 MG tablet Take 1 tablet (145 mg total) by mouth daily.   metFORMIN (GLUCOPHAGE-XR) 500 MG 24 hr tablet Take 2 tablets (1,000 mg total) by mouth daily with breakfast.   rosuvastatin (CRESTOR) 20 MG tablet Take 1 tablet (20 mg total) by mouth daily.   VITAMIN D PO Take 800 mg by mouth.   No facility-administered encounter medications on file as of 03/06/2021.    Allergies (verified) Ace inhibitors, Losartan, and Statins   History: Past Medical History:  Diagnosis Date   Esophageal ulcer 2000   Required blood transfusion   HTN (hypertension)    Hyperlipidemia     Tried statin but had severe myalgia   Shingles 2001   Left chest   Skin cancer 2018   left shoulder   Vaginal delivery    x1   Past Surgical History:  Procedure Laterality Date   ABDOMINAL HYSTERECTOMY     no cervix seen on pelvic exam; 12/2016, [place]   COLONOSCOPY WITH PROPOFOL N/A 07/15/2017   Procedure: COLONOSCOPY WITH PROPOFOL;  Surgeon: EManya Silvas MD;  [facility];  Service: Endoscopy;  Laterality: N/A;   Family History  Problem Relation Age of Onset   Hypertension Mother    Lung cancer Father 654  Throat cancer Father    Hypertension Sister    Breast cancer Paternal Aunt    Heart disease Maternal Uncle    Heart disease Maternal Aunt    Social History   Socioeconomic History   Marital status: Divorced    Spouse name: Not on file   Number of children: Not on file   Years of education: Not on file   Highest education level: Not on file  Occupational History   Not on file  Tobacco Use   Smoking status: Former    Packs/day: 1.00    Years: 39.00    Pack years: 39.00    Types: Cigarettes    Quit date: 08/01/2003    Years since quitting: 17.6   Smokeless tobacco: Never  Substance and Sexual Activity  Alcohol use: Yes    Comment: Occasional   Drug use: Not on file   Sexual activity: Not Currently  Other Topics Concern   Not on file  Social History Narrative   Regular Exercise -  Yes   Lives in [place] w/son         Social Determinants of Health   Financial Resource Strain: Low Risk    Difficulty of Paying Living Expenses: Not hard at all  Food Insecurity: No Food Insecurity   Worried About Charity fundraiser in the Last Year: Never true   [place] in the Last Year: Never true  Transportation Needs: No Transportation Needs   Lack of Transportation (Medical): No   Lack of Transportation (Non-Medical): No  Physical Activity: Sufficiently Active   Days of Exercise per Week: 3 days   Minutes of Exercise per Session: 60 min   Stress: No Stress Concern Present   Feeling of Stress : Not at all  Social Connections: Unknown   Frequency of Communication with Friends and Family: More than three times a week   Frequency of Social Gatherings with Friends and Family: More than three times a week   Attends Religious Services: Not on Electrical engineer or Organizations: Not on file   Attends Archivist Meetings: Not on file   Marital Status: Not on file    Tobacco Counseling Counseling given: Not Answered   Clinical Intake:  Pre-visit preparation completed: Yes        Diabetes: Yes (Followed by pcp)  How often do you need to have someone help you when you read instructions, pamphlets, or other written materials from your doctor or pharmacy?: 1 - Never  Nutrition Risk Assessment: Has the patient had any N/V/D within the last 2 months?  No  Does the patient have any non-healing wounds?  No  Has the patient had any unintentional weight loss or weight gain?  No   Financial Strains and Diabetes Management: Are you having any financial strains with the device, your supplies or your medication? No .  Does the patient want to be seen by Chronic Care Management for management of their diabetes?  No  Would the patient like to be referred to a Nutritionist or for Diabetic Management?  No     Interpreter Needed?: No      Activities of Daily Living In your present state of health, do you have any difficulty performing the following activities: 03/06/2021  Hearing? N  Vision? N  Difficulty concentrating or making decisions? N  Walking or climbing stairs? N  Dressing or bathing? N  Doing errands, shopping? N  Preparing Food and eating ? N  Using the Toilet? N  In the past six months, have you accidently leaked urine? N  Do you have problems with loss of bowel control? N  Managing your Medications? N  Managing your Finances? N  Housekeeping or managing your Housekeeping? N  Some  recent data might be hidden    Patient Care Team: [name], FNP as PCP - General (Family Medicine) Kate Sable, MD as PCP - Cardiology (Cardiology)  Indicate any recent Medical Services you may have received from other than [facility] providers in the past year (date may be approximate).     Assessment:   This is a routine wellness examination for Alicia Ramos.  I connected with Alicia Ramos today by telephone and verified that I am speaking with the correct person using  two identifiers. Location patient: home Location provider: work Persons participating in the virtual visit: patient, Marine scientist.    I discussed the limitations, risks, security and privacy concerns of performing an evaluation and management service by telephone and the availability of in person appointments. The patient expressed understanding and verbally consented to this telephonic visit.    Interactive audio and video telecommunications were attempted between this provider and patient, however failed, due to patient having technical difficulties OR patient did not have access to video capability.  We continued and completed visit with audio only.  Some vital signs may be absent or patient reported.   Hearing/Vision screen Hearing Screening - Comments:: Patient is able to hear conversational tones without difficulty.  No issues reported. Vision Screening - Comments:: Followed by [facility]  Wears corrective lenses  They have seen their ophthalmologist.   Dietary issues and exercise activities discussed: Current Exercise Habits: Home exercise routine, Intensity: Mild Healthy diet Good water intake   Goals Addressed               This Visit's Progress     Patient Stated     DIET - REDUCE PORTION SIZE (pt-stated)        Maintain exercise regimen Weight goal 170lb Resume zoom exercise classes       Depression Screen PHQ 2/9 Scores 03/06/2021 02/11/2021 03/05/2020 03/03/2019 10/30/2017 11/12/2016 09/27/2015   PHQ - 2 Score 0 0 0 0 0 0 0    Fall Risk Fall Risk  03/06/2021 02/11/2021 12/05/2020 03/05/2020 03/03/2019  Falls in the past year? 0 0 0 0 0  Number falls in past yr: 0 0 - 0 -  Injury with Fall? 0 0 - 0 -  Comment - - - - -  Risk for fall due to : History of fall(s) - - - -  Follow up Falls evaluation completed Falls evaluation completed Falls evaluation completed Falls evaluation completed -    FALL RISK PREVENTION PERTAINING TO THE HOME: Adequate lighting in your home to reduce risk of falls? Yes   ASSISTIVE DEVICES UTILIZED TO PREVENT FALLS: Life alert? No  Use of a cane, walker or w/c? No   TIMED UP AND GO: Was the test performed? No .   Cognitive Function: MMSE - Mini Mental State Exam 10/30/2017  Orientation to time 5  Orientation to Place 5  Registration 3  Attention/ Calculation 5  Recall 3  Language- name 2 objects 2  Language- repeat 1  Language- follow 3 step command 3  Language- read & follow direction 1  Write a sentence 1  Copy design 1  Total score 30     6CIT Screen 03/06/2021 03/05/2020 03/03/2019  What Year? 0 points 0 points 0 points  What month? 0 points 0 points 0 points  What time? 0 points 0 points 0 points  Count back from 20 0 points - 0 points  Months in reverse 0 points - 0 points  Repeat phrase 0 points - 0 points  Total Score 0 - 0    Immunizations Immunization History  Administered Date(s) Administered   Fluad Quad(high Dose 65+) 05/07/2019   Influenza Split 08/15/2011   Influenza, High Dose Seasonal PF 07/04/2020   PFIZER(Purple Top)SARS-COV-2 Vaccination 09/06/2019, 09/27/2019, 05/23/2020   Pneumococcal Conjugate-13 03/28/2015   Pneumococcal Polysaccharide-23 10/30/2017   Tdap 01/30/2018, 02/04/2019   Zoster Recombinat (Shingrix) 08/21/2020, 10/27/2020   Health Maintenance Health Maintenance  Topic Date Due   COVID-19 Vaccine (4 - Booster  for Pfizer series) 03/22/2021 (Originally 09/23/2020)   INFLUENZA VACCINE  04/22/2021  (Originally 02/25/2021)   HEMOGLOBIN A1C  08/21/2021   OPHTHALMOLOGY EXAM  08/30/2021   URINE MICROALBUMIN  11/14/2021   FOOT EXAM  02/11/2022   MAMMOGRAM  09/04/2022   COLONOSCOPY (Pts 45-3yr Insurance coverage will need to be confirmed)  07/16/2027   TETANUS/TDAP  02/03/2029   DEXA SCAN  Completed   Hepatitis C Screening  Completed   PNA vac Low Risk Adult  Completed   Zoster Vaccines- Shingrix  Completed   HPV VACCINES  Aged Out   Lung Cancer Screening: (Low Dose CT Chest recommended if Age 71-80years, 30 pack-year currently smoking OR have quit w/in 15years.) does not qualify.   Vision Screening: Recommended annual ophthalmology exams for early detection of glaucoma and other disorders of the eye.  Dental Screening: Recommended annual dental exams for proper oral hygiene  Community Resource Referral / Chronic Care Management: CRR required this visit?  No   CCM required this visit?  No      Plan:     I have personally reviewed and noted the following in the patient's chart:   Medical and social history Use of alcohol, tobacco or illicit drugs  Current medications and supplements including opioid prescriptions. Patient is not currently taking opioid.  Functional ability and status Nutritional status Physical activity Advanced directives List of other physicians Hospitalizations, surgeries, and ER visits in previous 12 months Vitals Screenings to include cognitive, depression, and falls Referrals and appointments  In addition, I have reviewed and discussed with patient certain preventive protocols, quality metrics, and best practice recommendations. A written personalized care plan for preventive services as well as general preventive health recommendations were provided to patient.     OVarney Biles LPN   [id number]

## 2021-03-06 NOTE — Patient Instructions (Addendum)
  Alicia Ramos , Thank you for taking time to come for your Medicare Wellness Visit. I appreciate your ongoing commitment to your health goals. Please review the following plan we discussed and let me know if I can assist you in the future.   These are the goals we discussed:  Goals       Patient Stated     DIET - REDUCE PORTION SIZE (pt-stated)      Maintain exercise regimen Weight goal 170lb Resume zoom exercise classes        This is a list of the screening recommended for you and due dates:  Health Maintenance  Topic Date Due   COVID-19 Vaccine (4 - Booster for Pfizer series) 03/22/2021*   Flu Shot  04/22/2021*   Hemoglobin A1C  08/21/2021   Eye exam for diabetics  08/30/2021   Urine Protein Check  11/14/2021   Complete foot exam   02/11/2022   Mammogram  09/04/2022   Colon Cancer Screening  07/16/2027   Tetanus Vaccine  02/03/2029   DEXA scan (bone density measurement)  Completed   Hepatitis C Screening: USPSTF Recommendation to screen - Ages 1-79 yo.  Completed   Pneumonia vaccines  Completed   Zoster (Shingles) Vaccine  Completed   HPV Vaccine  Aged Out  *Topic was postponed. The date shown is not the original due date.

## 2021-03-15 ENCOUNTER — Ambulatory Visit (INDEPENDENT_AMBULATORY_CARE_PROVIDER_SITE_OTHER): Payer: Medicare HMO

## 2021-03-15 ENCOUNTER — Other Ambulatory Visit: Payer: Self-pay

## 2021-03-15 DIAGNOSIS — I2510 Atherosclerotic heart disease of native coronary artery without angina pectoris: Secondary | ICD-10-CM

## 2021-03-15 LAB — ECHOCARDIOGRAM COMPLETE
AR max vel: 2.91 cm2
AV Area VTI: 2.81 cm2
AV Area mean vel: 3.09 cm2
AV Mean grad: 2 mmHg
AV Peak grad: 4.8 mmHg
Ao pk vel: 1.09 m/s
Area-P 1/2: 3.27 cm2
Calc EF: 48.1 %
S' Lateral: 3.7 cm
Single Plane A2C EF: 48.1 %
Single Plane A4C EF: 47.7 %

## 2021-03-19 ENCOUNTER — Telehealth: Payer: Self-pay

## 2021-03-19 NOTE — Telephone Encounter (Signed)
Called patient and left a VM for her to call back so we could give her the following result note:  Normal systolic function/squeezing function of heart, grade 2 diastolic dysfunction.  Continue medications as prescribed.  May consider stress testing if symptoms of shortness of breath are persistent.  Continue to monitor closely for now.

## 2021-03-20 NOTE — Telephone Encounter (Signed)
Patient returning call for results 

## 2021-03-20 NOTE — Telephone Encounter (Signed)
I called and spoke with the patient regarding her results.  She is aware to continue to monitor symptoms of SOB for now. She currently reports that SOB is noticeable when she goes on a hike or exerts herself, but she is having no issue with her ADL's.  I have asked her to monitor her breathing and if this worsens, she is to call back prior to her planned follow up on 04/25/21 with Dr. Garen Lah.   The patient asked if she could possibly participate in a Zumba class as she is not doing much cardio, but she does do a fair but of Yoga.  I have advised her that her heart muscle is strong so should be able to participate in a cardio workout, however, if cardio is new for her, I advised she should work up gradually to see what she can tolerate.  The patient voices understanding and is agreeable.  She was appreciative for the call back.

## 2021-03-26 ENCOUNTER — Other Ambulatory Visit: Payer: Self-pay | Admitting: Family

## 2021-03-26 DIAGNOSIS — I10 Essential (primary) hypertension: Secondary | ICD-10-CM

## 2021-03-27 ENCOUNTER — Other Ambulatory Visit: Payer: Medicare HMO

## 2021-04-10 ENCOUNTER — Other Ambulatory Visit: Payer: Medicare HMO

## 2021-04-22 ENCOUNTER — Ambulatory Visit
Admission: RE | Admit: 2021-04-22 | Discharge: 2021-04-22 | Disposition: A | Discharge status: Home / Self Care | Payer: Medicare HMO | Source: Ambulatory Visit | Attending: Family | Admitting: Family

## 2021-04-22 ENCOUNTER — Other Ambulatory Visit: Payer: Self-pay

## 2021-04-22 DIAGNOSIS — M85852 Other specified disorders of bone density and structure, left thigh: Secondary | ICD-10-CM | POA: Diagnosis not present

## 2021-04-22 DIAGNOSIS — M85832 Other specified disorders of bone density and structure, left forearm: Secondary | ICD-10-CM | POA: Diagnosis not present

## 2021-04-22 DIAGNOSIS — Z780 Asymptomatic menopausal state: Secondary | ICD-10-CM | POA: Insufficient documentation

## 2021-04-25 ENCOUNTER — Encounter: Payer: Self-pay | Admitting: Cardiology

## 2021-04-25 ENCOUNTER — Other Ambulatory Visit: Payer: Self-pay

## 2021-04-25 ENCOUNTER — Ambulatory Visit: Payer: Medicare HMO | Admitting: Cardiology

## 2021-04-25 VITALS — BP 140/68 | HR 72 | Ht 69.0 in | Wt 203.0 lb

## 2021-04-25 DIAGNOSIS — E782 Mixed hyperlipidemia: Secondary | ICD-10-CM | POA: Diagnosis not present

## 2021-04-25 DIAGNOSIS — I2510 Atherosclerotic heart disease of native coronary artery without angina pectoris: Secondary | ICD-10-CM | POA: Diagnosis not present

## 2021-04-25 DIAGNOSIS — I10 Essential (primary) hypertension: Secondary | ICD-10-CM

## 2021-04-25 NOTE — Patient Instructions (Signed)
Medication Instructions:   Your physician recommends that you continue on your current medications as directed. Please refer to the Current Medication list given to you today.  *If you need a refill on your cardiac medications before your next appointment, please call your pharmacy*   Lab Work:  Your physician recommends that you return for a FASTING lipid profile: The week prior to your 6 month follow up.  - You will need to be fasting. Please do not have anything to eat or drink after midnight the morning you have the lab work. You may only have water or black coffee with no cream or sugar.   We will call you closer to your appointment time and schedule you in our office for this lab draw.    Testing/Procedures: None ordered   Follow-Up: At [facility], you and your health needs are our priority.  As part of our continuing mission to provide you with exceptional heart care, we have created designated Provider Care Teams.  These Care Teams include your primary Cardiologist (physician) and Advanced Practice Providers (APPs -  Physician Assistants and Nurse Practitioners) who all work together to provide you with the care you need, when you need it.  We recommend signing up for the patient portal called "MyChart".  Sign up information is provided on this After Visit Summary.  MyChart is used to connect with patients for Virtual Visits (Telemedicine).  Patients are able to view lab/test results, encounter notes, upcoming appointments, etc.  Non-urgent messages can be sent to your provider as well.   To learn more about what you can do with MyChart, go to [email or web address].    Your next appointment:   6 month(s)  The format for your next appointment:   In Person  Provider:   Kate Sable, MD   Other Instructions

## 2021-04-25 NOTE — Progress Notes (Signed)
Cardiology Office Note:    Date:  04/25/2021   ID:  Alicia Ramos, DOB 1951/06/23, MRN [id number]  PCP:  [name], FNP   [facility] Providers Cardiologist:  Kate Sable, MD     Referring MD: [name], FNP   Chief Complaint  Patient presents with   Other    3 month follow up -- Meds reviewed verbally with patient.     History of Present Illness:    Alicia Ramos is a 70 y.o. female with a hx of CAD (Cor calcification on CT)hypertension, hyperlipidemia, diabetes, former smoker x30+ years who presents for follow-up.  Previously seen due to coronary artery calcification and hyperlipidemia/elevated triglycerides.  Started on fenofibrate.  Echocardiogram was ordered to evaluate systolic and diastolic function.  Denies chest pain, or shortness of breath.  Feels well otherwise.  Plans on exercising more, also plans on joining a Zumba class at the [facility].  Prior notes  she had a screening chest CT due to prior history of smoking in 2019 showing three-vessel coronary artery disease.    Past Medical History:  Diagnosis Date   Esophageal ulcer 2000   Required blood transfusion   HTN (hypertension)    Hyperlipidemia    Tried statin but had severe myalgia   Shingles 2001   Left chest   Skin cancer 2018   left shoulder   Vaginal delivery    x1    Past Surgical History:  Procedure Laterality Date   ABDOMINAL HYSTERECTOMY     no cervix seen on pelvic exam; 12/2016, [place]   COLONOSCOPY WITH PROPOFOL N/A 07/15/2017   Procedure: COLONOSCOPY WITH PROPOFOL;  Surgeon: Manya Silvas, MD;  [facility];  Service: Endoscopy;  Laterality: N/A;    Current Medications: Current Meds  Medication Sig   amLODipine (NORVASC) 5 MG tablet Take 1 tablet (5 mg total) by mouth daily.   aspirin EC 81 MG tablet Take 1 tablet (81 mg total) by mouth daily. Swallow whole.   carvedilol (COREG) 6.25 MG tablet TAKE 1 TABLET BY MOUTH 2 TIMES DAILY WITH A MEAL.    fenofibrate (TRICOR) 145 MG tablet Take 1 tablet (145 mg total) by mouth daily.   metFORMIN (GLUCOPHAGE-XR) 500 MG 24 hr tablet Take 2 tablets (1,000 mg total) by mouth daily with breakfast.   rosuvastatin (CRESTOR) 20 MG tablet Take 1 tablet (20 mg total) by mouth daily.   VITAMIN D PO Take 800 mg by mouth.     Allergies:   Ace inhibitors, Losartan, and Statins   Social History   Socioeconomic History   Marital status: Divorced    Spouse name: Not on file   Number of children: Not on file   Years of education: Not on file   Highest education level: Not on file  Occupational History   Not on file  Tobacco Use   Smoking status: Former    Packs/day: 1.00    Years: 39.00    Pack years: 39.00    Types: Cigarettes    Quit date: 08/01/2003    Years since quitting: 17.7   Smokeless tobacco: Never  Substance and Sexual Activity   Alcohol use: Yes    Comment: Occasional   Drug use: Not on file   Sexual activity: Not Currently  Other Topics Concern   Not on file  Social History Narrative   Regular Exercise -  Yes   Lives in [place] w/son         Social  Determinants of Health   Financial Resource Strain: Low Risk    Difficulty of Paying Living Expenses: Not hard at all  Food Insecurity: No Food Insecurity   Worried About [place] in the Last Year: Never true   Ran Out of Food in the Last Year: Never true  Transportation Needs: No Transportation Needs   Lack of Transportation (Medical): No   Lack of Transportation (Non-Medical): No  Physical Activity: Sufficiently Active   Days of Exercise per Week: 3 days   Minutes of Exercise per Session: 60 min  Stress: No Stress Concern Present   Feeling of Stress : Not at all  Social Connections: Unknown   Frequency of Communication with Friends and Family: More than three times a week   Frequency of Social Gatherings with Friends and Family: More than three times a week   Attends Religious Services: Not on Advertising copywriter or Organizations: Not on file   Attends Archivist Meetings: Not on file   Marital Status: Not on file     Family History: The patient's family history includes Breast cancer in her paternal aunt; Heart disease in her maternal aunt and maternal uncle; Hypertension in her mother and sister; Lung cancer (age of onset: 28) in her father; Throat cancer in her father.  ROS:   Please see the history of present illness.     All other systems reviewed and are negative.  EKGs/Labs/Other Studies Reviewed:    The following studies were reviewed today:   EKG:  EKG is ordered today.  EKG shows sinus rhythm with PACs.  Recent Labs: 08/10/2020: Hemoglobin 13.7; Platelets 283.0; TSH 4.43 12/28/2020: ALT 19; BUN 20; Creatinine, Ser 0.88; Potassium 4.7; Sodium 136  Recent Lipid Panel    Component Value Date/Time   CHOL 149 11/14/2020 0837   TRIG 224.0 (H) 11/14/2020 0837   HDL 51.10 11/14/2020 0837   CHOLHDL 3 11/14/2020 0837   VLDL 44.8 (H) 11/14/2020 0837   LDLDIRECT 83.0 11/14/2020 0837     Risk Assessment/Calculations:          Physical Exam:    VS:  BP 140/68 (BP Location: Left Arm, Patient Position: Sitting, Cuff Size: Normal)   Pulse 72   Ht 5\' 9"  (1.753 m)   Wt 203 lb (92.1 kg)   SpO2 97%   BMI 29.98 kg/m     Wt Readings from Last 3 Encounters:  04/25/21 203 lb (92.1 kg)  03/06/21 203 lb (92.1 kg)  02/11/21 203 lb 9.6 oz (92.4 kg)     GEN:  Well nourished, well developed in no acute distress HEENT: Normal NECK: No JVD; No carotid bruits LYMPHATICS: No lymphadenopathy CARDIAC: RRR, no murmurs, rubs, gallops RESPIRATORY:  Clear to auscultation without rales, wheezing or rhonchi  ABDOMEN: Soft, non-tender, non-distended MUSCULOSKELETAL:  No edema; No deformity  SKIN: Warm and dry NEUROLOGIC:  Alert and oriented x 3 PSYCHIATRIC:  Normal affect   ASSESSMENT:    1. Coronary artery disease involving native coronary artery of native  heart without angina pectoris   2. Primary hypertension   3. Mixed hyperlipidemia     PLAN:    In order of problems listed above:  CAD, three-vessel coronary calcifications on chest CT 2019.  Denies chest pain or shortness of breath.  Start aspirin 81 mg daily, continue Crestor.  Echo with EF 60 to 65%.  Grade 2 diastolic dysfunction. Hypertension, BP elevated today, usually controlled, continue Coreg, Norvasc.  Hyperlipidemia, hypertriglyceridemia.  On Crestor , fenofibrate.  Repeat fasting lipid profile prior to follow-up visit.  Follow-up in 6 months after fasting lipid profile.    Medication Adjustments/Labs and Tests Ordered: Current medicines are reviewed at length with the patient today.  Concerns regarding medicines are outlined above.  Orders Placed This Encounter  Procedures   Lipid panel   EKG 12-Lead     No orders of the defined types were placed in this encounter.    Patient Instructions  Medication Instructions:   Your physician recommends that you continue on your current medications as directed. Please refer to the Current Medication list given to you today.  *If you need a refill on your cardiac medications before your next appointment, please call your pharmacy*   Lab Work:  Your physician recommends that you return for a FASTING lipid profile: The week prior to your 6 month follow up.  - You will need to be fasting. Please do not have anything to eat or drink after midnight the morning you have the lab work. You may only have water or black coffee with no cream or sugar.   We will call you closer to your appointment time and schedule you in our office for this lab draw.    Testing/Procedures: None ordered   Follow-Up: At [facility], you and your health needs are our priority.  As part of our continuing mission to provide you with exceptional heart care, we have created designated Provider Care Teams.  These Care Teams include your primary  Cardiologist (physician) and Advanced Practice Providers (APPs -  Physician Assistants and Nurse Practitioners) who all work together to provide you with the care you need, when you need it.  We recommend signing up for the patient portal called "MyChart".  Sign up information is provided on this After Visit Summary.  MyChart is used to connect with patients for Virtual Visits (Telemedicine).  Patients are able to view lab/test results, encounter notes, upcoming appointments, etc.  Non-urgent messages can be sent to your provider as well.   To learn more about what you can do with MyChart, go to [email or web address].    Your next appointment:   6 month(s)  The format for your next appointment:   In Person  Provider:   Kate Sable, MD   Other Instructions    Signed, Kate Sable, MD  04/25/2021 1:09 PM    [place]

## 2021-05-17 ENCOUNTER — Other Ambulatory Visit: Payer: Self-pay | Admitting: *Deleted

## 2021-05-17 MED ORDER — FENOFIBRATE 145 MG PO TABS: 145.0000 mg | ORAL_TABLET | Freq: Every day | ORAL | 3 refills | Status: DC

## 2021-05-20 ENCOUNTER — Ambulatory Visit: Payer: Medicare HMO | Admitting: Family

## 2021-05-24 ENCOUNTER — Encounter: Payer: Self-pay | Admitting: Family

## 2021-05-24 ENCOUNTER — Other Ambulatory Visit: Payer: Self-pay

## 2021-05-24 ENCOUNTER — Ambulatory Visit (INDEPENDENT_AMBULATORY_CARE_PROVIDER_SITE_OTHER): Payer: Medicare HMO | Admitting: Family

## 2021-05-24 VITALS — BP 128/82 | HR 73 | Temp 95.1°F | Ht 69.02 in | Wt 203.4 lb

## 2021-05-24 DIAGNOSIS — E119 Type 2 diabetes mellitus without complications: Secondary | ICD-10-CM

## 2021-05-24 DIAGNOSIS — I700 Atherosclerosis of aorta: Secondary | ICD-10-CM

## 2021-05-24 DIAGNOSIS — I10 Essential (primary) hypertension: Secondary | ICD-10-CM

## 2021-05-24 LAB — COMPREHENSIVE METABOLIC PANEL
ALT: 21 U/L (ref 0–35)
AST: 21 U/L (ref 0–37)
Albumin: 4.7 g/dL (ref 3.5–5.2)
Alkaline Phosphatase: 43 U/L (ref 39–117)
BUN: 17 mg/dL (ref 6–23)
CO2: 28 mEq/L (ref 19–32)
Calcium: 9.5 mg/dL (ref 8.4–10.5)
Chloride: 101 mEq/L (ref 96–112)
Creatinine, Ser: 1 mg/dL (ref 0.40–1.20)
GFR: 56.72 mL/min — ABNORMAL LOW (ref >60.00)
Glucose, Bld: 128 mg/dL — ABNORMAL HIGH (ref 70–99)
Potassium: 4 mEq/L (ref 3.5–5.1)
Sodium: 136 mEq/L (ref 135–145)
Total Bilirubin: 0.5 mg/dL (ref 0.2–1.2)
Total Protein: 7.6 g/dL (ref 6.0–8.3)

## 2021-05-24 LAB — HEMOGLOBIN A1C: Hgb A1c MFr Bld: 6.9 % — ABNORMAL HIGH (ref 4.6–6.5)

## 2021-05-24 LAB — LIPID PANEL
Cholesterol: 136 mg/dL (ref 0–200)
HDL: 50.8 mg/dL (ref >39.00)
LDL Cholesterol: 56 mg/dL (ref 0–99)
NonHDL: 85.46
Total CHOL/HDL Ratio: 3
Triglycerides: 149 mg/dL (ref 0.0–149.0)
VLDL: 29.8 mg/dL (ref 0.0–40.0)

## 2021-05-24 NOTE — Assessment & Plan Note (Signed)
Chronic, stable.  Continue Coreg 6.25 mg twice daily, amlodipine 5 mg

## 2021-05-24 NOTE — Progress Notes (Signed)
Subjective:    Patient ID: Alicia Ramos, female    DOB: 1950-08-27, 70 y.o.   MRN: [id number]  CC: Alicia Ramos is a 70 y.o. female who presents today for follow up.   HPI: Feels well today.  No new concerns.  DM-compliant with metformin 1000 mg daily  hyperlipidemia-compliant with fenofibrate 145, Crestor 20 mg  Hypertension-compliant with coreg 6.25mg  and amlodipine 5mg  follow-up with cardiology, Dr. Charlestine Ramos 03/2028/2022 for CAD, hypertension, hyperlipidemia.  Advised to start 81 mg of aspirin.  Echo shows grade 2 diastolic dysfunction. HISTORY:  Past Medical History:  Diagnosis Date   Esophageal ulcer 2000   Required blood transfusion   HTN (hypertension)    Hyperlipidemia    Tried statin but had severe myalgia   Shingles 2001   Left chest   Skin cancer 2018   left shoulder   Vaginal delivery    x1   Past Surgical History:  Procedure Laterality Date   ABDOMINAL HYSTERECTOMY     no cervix seen on pelvic exam; 12/2016, Alicia Ramos   COLONOSCOPY WITH PROPOFOL N/A 07/15/2017   Procedure: COLONOSCOPY WITH PROPOFOL;  Surgeon: Alicia Silvas, MD;  Location: [facility] ENDOSCOPY;  Service: Endoscopy;  Laterality: N/A;   Family History  Problem Relation Age of Onset   Hypertension Mother    Lung cancer Father 61   Throat cancer Father    Hypertension Sister    Breast cancer Paternal Aunt    Heart disease Maternal Uncle    Heart disease Maternal Aunt     Allergies: Ace inhibitors, Losartan, and Statins Current Outpatient Medications on File Prior to Visit  Medication Sig Dispense Refill   amLODipine (NORVASC) 5 MG tablet Take 1 tablet (5 mg total) by mouth daily. 90 tablet 1   aspirin EC 81 MG tablet Take 1 tablet (81 mg total) by mouth daily. Swallow whole.     carvedilol (COREG) 6.25 MG tablet TAKE 1 TABLET BY MOUTH 2 TIMES DAILY WITH A MEAL. 180 tablet 1   fenofibrate (TRICOR) 145 MG tablet Take 1 tablet (145 mg total) by mouth daily. 30 tablet 3   metFORMIN (GLUCOPHAGE-XR)  500 MG 24 hr tablet Take 2 tablets (1,000 mg total) by mouth daily with breakfast. 180 tablet 1   rosuvastatin (CRESTOR) 20 MG tablet Take 1 tablet (20 mg total) by mouth daily. 90 tablet 3   VITAMIN D PO Take 800 mg by mouth.     No current facility-administered medications on file prior to visit.    Social History   Tobacco Use   Smoking status: Former    Packs/day: 1.00    Years: 39.00    Pack years: 39.00    Types: Cigarettes    Quit date: 08/01/2003    Years since quitting: 17.8   Smokeless tobacco: Never  Substance Use Topics   Alcohol use: Yes    Comment: Occasional    Review of Systems  Constitutional:  Negative for chills and fever.  Respiratory:  Negative for cough.   Cardiovascular:  Negative for chest pain and palpitations.  Gastrointestinal:  Negative for nausea and vomiting.     Objective:    BP 128/82   Pulse 73   Temp (!) 95.1 F (35.1 C)   Ht 5' 9.02" (1.753 m)   Wt 203 lb 6.4 oz (92.3 kg)   SpO2 97%   BMI 30.02 kg/m  BP Readings from Last 3 Encounters:  05/24/21 128/82  04/25/21 140/68  03/06/21 126/80  Wt Readings from Last 3 Encounters:  05/24/21 203 lb 6.4 oz (92.3 kg)  04/25/21 203 lb (92.1 kg)  03/06/21 203 lb (92.1 kg)    Physical Exam Vitals reviewed.  Constitutional:      Appearance: She is well-developed.  Eyes:     Conjunctiva/sclera: Conjunctivae normal.  Cardiovascular:     Rate and Rhythm: Normal rate and regular rhythm.     Pulses: Normal pulses.     Heart sounds: Normal heart sounds.  Pulmonary:     Effort: Pulmonary effort is normal.     Breath sounds: Normal breath sounds. No wheezing, rhonchi or rales.  Skin:    General: Skin is warm and dry.  Neurological:     Mental Status: She is alert.  Psychiatric:        Speech: Speech normal.        Behavior: Behavior normal.        Thought Content: Thought content normal.       Assessment & Plan:   Problem List Items Addressed This Visit       Cardiovascular and  Mediastinum   Atherosclerosis of aorta (HCC)    Chronic, stable.  Compliant with aspirin 81 mg, fenofibrate 145 mg, Crestor 20mg       Hypertension    Chronic, stable.  Continue Coreg 6.25 mg twice daily, amlodipine 5 mg        Endocrine   Diabetes mellitus without complication (Alicia Ramos) - Primary    Anticipate improved.  Pending A1c.  Continue metformin 1000 mg daily      Relevant Orders   Hemoglobin A1c   Comprehensive metabolic panel   Lipid panel     I am having Alicia Ramos maintain her VITAMIN D PO, amLODipine, rosuvastatin, aspirin EC, carvedilol, metFORMIN, and fenofibrate.   No orders of the defined types were placed in this encounter.   Return precautions given.   Risks, benefits, and alternatives of the medications and treatment plan prescribed today were discussed, and patient expressed understanding.   Education regarding symptom management and diagnosis given to patient on AVS.  Continue to follow with Alicia Hawthorne, FNP for routine health maintenance.   Alicia Ramos and I agreed with plan.   Alicia Paris, FNP

## 2021-05-24 NOTE — Patient Instructions (Addendum)
Nice to see you!  This is  Dr. Tullo's  example of a  "Low GI"  Diet:  It will allow you to lose 4 to 8  lbs  per month if you follow it carefully.  Your goal with exercise is a minimum of 30 minutes of aerobic exercise 5 days per week (Walking does not count once it becomes easy!)    All of the foods can be found at grocery stores and in bulk at [employer].  The Atkins protein bars and shakes are available in more varieties at Target, WalMart and Lowe's Foods.     7 AM Breakfast:  Choose from the following:  Low carbohydrate Protein  Shakes (I recommend the  Premier Protein chocolate shakes,  EAS AdvantEdge "Carb Control" shakes  Or the Atkins shakes all are under 3 net carbs)     a scrambled egg/bacon/cheese burrito made with [employer]'s "carb balance" whole wheat tortilla  (about 10 net carbs )  [employer]my Deans sells microwaveable frittata (basically a quiche without the pastry crust) that is eaten cold and very convenient way to get your eggs.  8 carbs)  If you make your own protein shakes, avoid bananas and pineapple,  And use low carb greek yogurt or original /unsweetened almond or soy milk    Avoid cereal and bananas, oatmeal and cream of wheat and grits. They are loaded with carbohydrates!   10 AM: high protein snack:  Protein bar by Atkins (the snack size, under 200 cal, usually < 6 net carbs).    A stick of cheese:  Around 1 carb,  100 cal     Dannon Light n Fit Greek Yogurt  (80 cal, 8 carbs)  Other so called "protein bars" and Greek yogurts tend to be loaded with carbohydrates.  Remember, in food advertising, the word "energy" is synonymous for " carbohydrate."  Lunch:   A Sandwich using the bread choices listed, Can use any  Eggs,  lunchmeat, grilled meat or canned tuna), avocado, regular mayo/mustard  and cheese.  A Salad using blue cheese, ranch,  Goddess or vinagrette,  Avoid taco shells, croutons or "confetti" and no "candied nuts" but regular nuts OK.   No pretzels, nabs  or  chips.  Pickles and miniature sweet peppers are a good low carb alternative that provide a "crunch"  The bread is the only source of carbohydrate in a sandwich and  can be decreased by trying some of the attached alternatives to traditional loaf bread   Avoid "Low fat dressings, as well as [place] and [place] dressings They are loaded with sugar!   3 PM/ Mid day  Snack:  Consider  1 ounce of  almonds, walnuts, pistachios, pecans, peanuts,  Macadamia nuts or a nut medley.  Avoid "granola and granola bars "  Mixed nuts are ok in moderation as long as there are no raisins,  cranberries or dried fruit.   KIND bars are OK if you get the low glycemic index variety   Try the prosciutto/mozzarella cheese sticks by Fiorruci  In deli /backery section   High protein      6 PM  Dinner:     Meat/fowl/fish with a green salad, and either broccoli, cauliflower, green beans, spinach, brussel sprouts or  Lima beans. DO NOT BREAD THE PROTEIN!!      There is a low carb pasta by Dreamfield's that is acceptable and tastes great: only 5 digestible carbs/serving.( All grocery stores but BJs carry it )   Several ready made meals are available low carb:   Try Michel Angelo's chicken piccata or chicken or eggplant parm over low carb pasta.(Lowes and BJs)   Aaron Sanchez's "Carnitas" (pulled pork, no sauce,  0 carbs) or his beef pot roast to make a dinner burrito (at BJ's)  Pesto over low carb pasta (bj's sells a good quality pesto in the center refrigerated section of the deli   Try satueeing  Bok Choy with mushroooms as a good side   Green Giant makes a mashed cauliflower that tastes like mashed potatoes  Whole wheat pasta is still full of digestible carbs and  Not as low in glycemic index as Dreamfield's.   Brown rice is still rice,  So skip the rice and noodles if you eat Chinese or Thai (or at least limit to 1/2 cup)  9 PM snack :   Breyer's "low carb" fudgsicle or  ice cream bar (Carb Smart line), or   Weight Watcher's ice cream bar , or another "no sugar added" ice cream;  a serving of fresh berries/cherries with whipped cream   Cheese or DANNON'S LlGHT N FIT GREEK YOGURT  8 ounces of [place] unsweetened almond/cococunut milk    Treat yourself to a parfait made with whipped cream blueberiies, walnuts and vanilla greek yogurt  Avoid bananas, pineapple, grapes  and watermelon on a regular basis because they are high in sugar.  THINK OF THEM AS DESSERT  Remember that snack Substitutions should be less than 10 NET carbs per serving and meals < 20 carbs. Remember to subtract fiber grams to get the "net carbs."    

## 2021-05-24 NOTE — Assessment & Plan Note (Signed)
Anticipate improved.  Pending A1c.  Continue metformin 1000 mg daily

## 2021-05-24 NOTE — Assessment & Plan Note (Signed)
Chronic, stable.  Compliant with aspirin 81 mg, fenofibrate 145 mg, Crestor 20mg 

## 2021-05-28 NOTE — Addendum Note (Signed)
Addended by: Elpidio Galea T on: 05/28/2021 02:04 PM   Modules accepted: Orders

## 2021-06-04 DIAGNOSIS — B9789 Other viral agents as the cause of diseases classified elsewhere: Secondary | ICD-10-CM | POA: Diagnosis not present

## 2021-06-04 DIAGNOSIS — Z20822 Contact with and (suspected) exposure to covid-19: Secondary | ICD-10-CM | POA: Diagnosis not present

## 2021-06-04 DIAGNOSIS — J988 Other specified respiratory disorders: Secondary | ICD-10-CM | POA: Diagnosis not present

## 2021-06-04 DIAGNOSIS — R070 Pain in throat: Secondary | ICD-10-CM | POA: Diagnosis not present

## 2021-06-11 ENCOUNTER — Other Ambulatory Visit: Payer: Medicare HMO

## 2021-07-01 ENCOUNTER — Other Ambulatory Visit: Payer: Medicare HMO

## 2021-08-08 ENCOUNTER — Other Ambulatory Visit: Payer: Self-pay | Admitting: Family

## 2021-08-08 DIAGNOSIS — I10 Essential (primary) hypertension: Secondary | ICD-10-CM

## 2021-08-15 ENCOUNTER — Other Ambulatory Visit: Payer: Self-pay

## 2021-08-15 ENCOUNTER — Other Ambulatory Visit: Payer: Self-pay | Admitting: Family

## 2021-08-15 DIAGNOSIS — Z1231 Encounter for screening mammogram for malignant neoplasm of breast: Secondary | ICD-10-CM

## 2021-08-15 MED ORDER — FENOFIBRATE 145 MG PO TABS: 145.0000 mg | ORAL_TABLET | Freq: Every day | ORAL | 1 refills | Status: DC

## 2021-08-19 ENCOUNTER — Other Ambulatory Visit: Payer: Self-pay | Admitting: Family

## 2021-08-19 DIAGNOSIS — I10 Essential (primary) hypertension: Secondary | ICD-10-CM

## 2021-08-19 DIAGNOSIS — E119 Type 2 diabetes mellitus without complications: Secondary | ICD-10-CM

## 2021-08-30 ENCOUNTER — Other Ambulatory Visit: Payer: Self-pay

## 2021-08-30 ENCOUNTER — Ambulatory Visit (INDEPENDENT_AMBULATORY_CARE_PROVIDER_SITE_OTHER): Payer: Medicare HMO | Admitting: Family

## 2021-08-30 ENCOUNTER — Encounter: Payer: Self-pay | Admitting: Family

## 2021-08-30 VITALS — BP 146/86 | HR 74 | Temp 97.7°F | Resp 14 | Ht 69.02 in | Wt 202.8 lb

## 2021-08-30 DIAGNOSIS — E119 Type 2 diabetes mellitus without complications: Secondary | ICD-10-CM

## 2021-08-30 DIAGNOSIS — I10 Essential (primary) hypertension: Secondary | ICD-10-CM

## 2021-08-30 DIAGNOSIS — I700 Atherosclerosis of aorta: Secondary | ICD-10-CM | POA: Diagnosis not present

## 2021-08-30 LAB — BASIC METABOLIC PANEL
BUN: 16 mg/dL (ref 6–23)
CO2: 30 mEq/L (ref 19–32)
Calcium: 9.7 mg/dL (ref 8.4–10.5)
Chloride: 100 mEq/L (ref 96–112)
Creatinine, Ser: 0.94 mg/dL (ref 0.40–1.20)
GFR: 60.97 mL/min (ref >60.00)
Glucose, Bld: 127 mg/dL — ABNORMAL HIGH (ref 70–99)
Potassium: 4.3 mEq/L (ref 3.5–5.1)
Sodium: 135 mEq/L (ref 135–145)

## 2021-08-30 LAB — MICROALBUMIN / CREATININE URINE RATIO
Creatinine,U: 37.7 mg/dL
Microalb Creat Ratio: 1.9 mg/g (ref 0.0–30.0)
Microalb, Ur: <0.7 mg/dL (ref 0.0–1.9)

## 2021-08-30 LAB — HEMOGLOBIN A1C: Hgb A1c MFr Bld: 6.9 % — ABNORMAL HIGH (ref 4.6–6.5)

## 2021-08-30 NOTE — Assessment & Plan Note (Signed)
Elevated today.  Advised consideration of HCTZ 12.5 mg.  She would like to monitor at home and reduce salt in her diet prior to any escalation of antihypertensive medications.  For now, continue amlodipine 5 mg, carvedilol 6.25 mg twice daily.  She will call me with blood pressure readings from home

## 2021-08-30 NOTE — Progress Notes (Signed)
Subjective:    Patient ID: Alicia Ramos, female    DOB: 1950-09-03, 71 y.o.   MRN: [id number]  CC: Alicia Ramos is a 71 y.o. female who presents today for follow up.   HPI: Feels well today She has started cardio 2x per week and yoga 3 x per week.  No cp, sob She has been eating more salt in diet.     HTN- she took blood pressure medications just prior to coming into office.  She is compliant with amlodipine 5 mg, carvedilol 6.25 mg twice daily.  No further leg swelling  DM-- compliant with metformin 1000mg  BID.   Atherosclerosis-compliant with aspirin 81 mg, fenofibrate 145 mg, Crestor 20mg   HISTORY:  Past Medical History:  Diagnosis Date   Esophageal ulcer 2000   Required blood transfusion   HTN (hypertension)    Hyperlipidemia    Tried statin but had severe myalgia   Shingles 2001   Left chest   Skin cancer 2018   left shoulder   Vaginal delivery    x1   Past Surgical History:  Procedure Laterality Date   ABDOMINAL HYSTERECTOMY     no cervix seen on pelvic exam; 12/2016, Alicia Ramos   COLONOSCOPY WITH PROPOFOL N/A 07/15/2017   Procedure: COLONOSCOPY WITH PROPOFOL;  Surgeon: Manya Silvas, MD;  Location: [facility] ENDOSCOPY;  Service: Endoscopy;  Laterality: N/A;   Family History  Problem Relation Age of Onset   Hypertension Mother    Lung cancer Father 46   Throat cancer Father    Hypertension Sister    Breast cancer Paternal Aunt    Heart disease Maternal Uncle    Heart disease Maternal Aunt     Allergies: Ace inhibitors, Losartan, and Statins Current Outpatient Medications on File Prior to Visit  Medication Sig Dispense Refill   amLODipine (NORVASC) 5 MG tablet TAKE 1 TABLET (5 MG TOTAL) BY MOUTH DAILY. 90 tablet 1   aspirin EC 81 MG tablet Take 1 tablet (81 mg total) by mouth daily. Swallow whole.     carvedilol (COREG) 6.25 MG tablet TAKE 1 TABLET BY MOUTH 2 TIMES DAILY WITH A MEAL. 180 tablet 1   fenofibrate (TRICOR) 145 MG tablet Take 1 tablet (145 mg  total) by mouth daily. 30 tablet 1   metFORMIN (GLUCOPHAGE-XR) 500 MG 24 hr tablet TAKE 2 TABLETS BY MOUTH EVERY DAY WITH BREAKFAST 180 tablet 1   rosuvastatin (CRESTOR) 20 MG tablet Take 1 tablet (20 mg total) by mouth daily. 90 tablet 3   VITAMIN D PO Take 800 mg by mouth.     No current facility-administered medications on file prior to visit.    Social History   Tobacco Use   Smoking status: Former    Packs/day: 1.00    Years: 39.00    Pack years: 39.00    Types: Cigarettes    Quit date: 08/01/2003    Years since quitting: 18.0   Smokeless tobacco: Never  Substance Use Topics   Alcohol use: Yes    Comment: Occasional    Review of Systems  Constitutional:  Negative for chills and fever.  Respiratory:  Negative for cough.   Cardiovascular:  Negative for chest pain and palpitations.  Gastrointestinal:  Negative for nausea and vomiting.     Objective:    BP (!) 146/86 (BP Location: Left Arm, Cuff Size: Normal)    Pulse 74    Temp 97.7 F (36.5 C) (Oral)    Resp 14  Ht 5' 9.02" (1.753 m)    Wt 202 lb 12.8 oz (92 kg)    SpO2 99%    BMI 29.93 kg/m  BP Readings from Last 3 Encounters:  08/30/21 (!) 146/86  05/24/21 128/82  04/25/21 140/68   Wt Readings from Last 3 Encounters:  08/30/21 202 lb 12.8 oz (92 kg)  05/24/21 203 lb 6.4 oz (92.3 kg)  04/25/21 203 lb (92.1 kg)    Physical Exam Vitals reviewed.  Constitutional:      Appearance: She is well-developed.  Eyes:     Conjunctiva/sclera: Conjunctivae normal.  Cardiovascular:     Rate and Rhythm: Normal rate and regular rhythm.     Pulses: Normal pulses.     Heart sounds: Normal heart sounds.  Pulmonary:     Effort: Pulmonary effort is normal.     Breath sounds: Normal breath sounds. No wheezing, rhonchi or rales.  Skin:    General: Skin is warm and dry.  Neurological:     Mental Status: She is alert.  Psychiatric:        Speech: Speech normal.        Behavior: Behavior normal.        Thought Content:  Thought content normal.       Assessment & Plan:   Problem List Items Addressed This Visit       Cardiovascular and Mediastinum   Atherosclerosis of aorta (HCC)    Chronic, symptomatically stable.  Continue aspirin 81 mg, fenofibrate 145 mg, Crestor 20mg .  She will continue to follow with cardiology, will follow      Hypertension    Elevated today.  Advised consideration of HCTZ 12.5 mg.  She would like to monitor at home and reduce salt in her diet prior to any escalation of antihypertensive medications.  For now, continue amlodipine 5 mg, carvedilol 6.25 mg twice daily.  She will call me with blood pressure readings from home        Endocrine   Diabetes mellitus without complication ([place]) - Primary   Relevant Orders   Basic metabolic panel   Hemoglobin A1c   Microalbumin / creatinine urine ratio     I am having Alicia Ramos maintain her VITAMIN D PO, rosuvastatin, aspirin EC, carvedilol, fenofibrate, metFORMIN, and amLODipine.   No orders of the defined types were placed in this encounter.   Return precautions given.   Risks, benefits, and alternatives of the medications and treatment plan prescribed today were discussed, and patient expressed understanding.   Education regarding symptom management and diagnosis given to patient on AVS.  Continue to follow with Alicia Hawthorne, FNP for routine health maintenance.   Alicia Ramos and I agreed with plan.   Alicia Paris, FNP

## 2021-08-30 NOTE — Patient Instructions (Addendum)
It is imperative that you are seen AT least twice per year for labs and monitoring. Monitor blood pressure at home and me 5-6 reading on separate days. Goal is less than 120/80, based on newest guidelines, however we certainly want to be less than 130/80;  if persistently higher, please make sooner follow up appointment so we can recheck you blood pressure and manage/ adjust medications.  Nice to see you!   

## 2021-08-30 NOTE — Assessment & Plan Note (Signed)
Chronic, symptomatically stable.  Continue aspirin 81 mg, fenofibrate 145 mg, Crestor 20mg .  She will continue to follow with cardiology, will follow

## 2021-09-04 DIAGNOSIS — E119 Type 2 diabetes mellitus without complications: Secondary | ICD-10-CM | POA: Diagnosis not present

## 2021-09-04 DIAGNOSIS — D3132 Benign neoplasm of left choroid: Secondary | ICD-10-CM | POA: Diagnosis not present

## 2021-09-04 LAB — HM DIABETES EYE EXAM

## 2021-09-05 ENCOUNTER — Other Ambulatory Visit: Payer: Self-pay

## 2021-09-05 DIAGNOSIS — E119 Type 2 diabetes mellitus without complications: Secondary | ICD-10-CM

## 2021-09-05 MED ORDER — METFORMIN HCL ER 500 MG PO TB24: 1500.0000 mg | ORAL_TABLET | Freq: Every evening | ORAL | 1 refills | Status: DC

## 2021-09-11 ENCOUNTER — Other Ambulatory Visit: Payer: Self-pay

## 2021-09-11 ENCOUNTER — Telehealth: Payer: Self-pay | Admitting: Family

## 2021-09-11 DIAGNOSIS — I10 Essential (primary) hypertension: Secondary | ICD-10-CM

## 2021-09-11 MED ORDER — HYDROCHLOROTHIAZIDE 12.5 MG PO TABS: 12.5000 mg | ORAL_TABLET | Freq: Every day | ORAL | 3 refills | Status: DC

## 2021-09-11 NOTE — Telephone Encounter (Signed)
noted 

## 2021-09-11 NOTE — Telephone Encounter (Signed)
Call patient We had discussed the addition of HCTZ 12.5 mg taken once per day if blood pressures remain greater than 120/80.  Hers are quite dramatically elevated.  Please triage and ensure no chest pain, shortness of breath, headache or vision changes As long as she is asymptomatic, please send in HCTZ 12.5 mg to take once a day.  Please order and schedule BMP for 1 week.  Please ensure she has follow-up with me for blood pressure in 2 weeks.  Please ask her to bring her blood pressure machine with her to follow-up

## 2021-09-11 NOTE — Telephone Encounter (Signed)
FYI patient does not have vision changes, chest pain, SOB, left arm pain/numbness/weakness. She said that sometimes she does have a slight HA's. She takes intermittent tylenol & ibuprofen for this. This is not the worst HA of her life, she does have vision changes with these or aura. I did send in the HCTZ 12.5 mg & scheduled labs & f/u. Pt will let us know if any issues between the time she is seen.

## 2021-09-11 NOTE — Telephone Encounter (Signed)
Patient called, she was seen by Arnett on 08/30/2021. Arnett wanted her bp readings; 2/6-157/83, 2/9- 167/78, 2/11- 162/75, 2/14-170/76, 2/15-170/85.

## 2021-09-11 NOTE — Telephone Encounter (Signed)
LMTCB to triage. Ordered BMP if agreeable to HCTZ.

## 2021-09-12 ENCOUNTER — Other Ambulatory Visit: Payer: Self-pay | Admitting: *Deleted

## 2021-09-12 MED ORDER — FENOFIBRATE 145 MG PO TABS: 145.0000 mg | ORAL_TABLET | Freq: Every day | ORAL | 1 refills | Status: DC

## 2021-09-19 ENCOUNTER — Other Ambulatory Visit: Payer: Self-pay

## 2021-09-19 ENCOUNTER — Other Ambulatory Visit (INDEPENDENT_AMBULATORY_CARE_PROVIDER_SITE_OTHER): Payer: Medicare HMO

## 2021-09-19 DIAGNOSIS — I10 Essential (primary) hypertension: Secondary | ICD-10-CM

## 2021-09-19 LAB — BASIC METABOLIC PANEL
BUN: 22 mg/dL (ref 6–23)
CO2: 30 mEq/L (ref 19–32)
Calcium: 9.6 mg/dL (ref 8.4–10.5)
Chloride: 97 mEq/L (ref 96–112)
Creatinine, Ser: 0.95 mg/dL (ref 0.40–1.20)
GFR: 60.18 mL/min (ref >60.00)
Glucose, Bld: 86 mg/dL (ref 70–99)
Potassium: 4.4 mEq/L (ref 3.5–5.1)
Sodium: 133 mEq/L — ABNORMAL LOW (ref 135–145)

## 2021-09-25 ENCOUNTER — Ambulatory Visit
Admission: RE | Admit: 2021-09-25 | Discharge: 2021-09-25 | Disposition: A | Discharge status: Home / Self Care | Payer: Medicare HMO | Source: Ambulatory Visit | Attending: Family | Admitting: Family

## 2021-09-25 ENCOUNTER — Other Ambulatory Visit: Payer: Self-pay

## 2021-09-25 DIAGNOSIS — Z1231 Encounter for screening mammogram for malignant neoplasm of breast: Secondary | ICD-10-CM | POA: Insufficient documentation

## 2021-09-30 ENCOUNTER — Ambulatory Visit (INDEPENDENT_AMBULATORY_CARE_PROVIDER_SITE_OTHER): Payer: Medicare HMO | Admitting: Family

## 2021-09-30 ENCOUNTER — Encounter: Payer: Self-pay | Admitting: Family

## 2021-09-30 ENCOUNTER — Other Ambulatory Visit: Payer: Self-pay

## 2021-09-30 VITALS — BP 128/68 | HR 86 | Temp 98.0°F | Ht 69.0 in | Wt 204.2 lb

## 2021-09-30 DIAGNOSIS — I10 Essential (primary) hypertension: Secondary | ICD-10-CM

## 2021-09-30 DIAGNOSIS — Z8739 Personal history of other diseases of the musculoskeletal system and connective tissue: Secondary | ICD-10-CM

## 2021-09-30 LAB — BASIC METABOLIC PANEL
BUN: 21 mg/dL (ref 6–23)
CO2: 26 mEq/L (ref 19–32)
Calcium: 9.7 mg/dL (ref 8.4–10.5)
Chloride: 97 mEq/L (ref 96–112)
Creatinine, Ser: 0.91 mg/dL (ref 0.40–1.20)
GFR: 63.36 mL/min (ref >60.00)
Glucose, Bld: 76 mg/dL (ref 70–99)
Potassium: 4.2 mEq/L (ref 3.5–5.1)
Sodium: 132 mEq/L — ABNORMAL LOW (ref 135–145)

## 2021-09-30 LAB — VITAMIN D 25 HYDROXY (VIT D DEFICIENCY, FRACTURES): VITD: 50.47 ng/mL (ref 30.00–100.00)

## 2021-09-30 MED ORDER — CARVEDILOL 12.5 MG PO TABS: 12.5000 mg | ORAL_TABLET | Freq: Two times a day (BID) | ORAL | 3 refills | Status: DC

## 2021-09-30 NOTE — Patient Instructions (Addendum)
Stop hctz ?Start carvedilol 12.'5mg'$  twice per day, increase from prior dose ?Nice to see you today! ? ? ? ? ? ? ? ?

## 2021-09-30 NOTE — Assessment & Plan Note (Addendum)
Improved.  She is urinating more frequently on hydrochlorothiazide which is more bothersome for her.  In an effort to simplify her regimen, we opted to discontinue hydrochlorothiazide.  I opted to increase carvedilol to 12.5 mg twice daily.  She will continue amlodipine 5 mg.  She will let me know how she is doing.  Pending BMP today ?Of note: I have sent paperwork for her to continue to precipitate in hiking program in the absence of any cardiac concerns at this time. ?

## 2021-09-30 NOTE — Progress Notes (Signed)
? ?Subjective:  ? ? Patient ID: Alicia Ramos, female    DOB: 04/02/1951, 71 y.o.   MRN: [id number] ? ?CC: Alicia Ramos is a 71 y.o. female who presents today for follow up.  ? ?HPI: Feels well today.  No new complaints.  Since starting hydrochlorothiazide, she is urinating more often.  She is interested in consolidating medications.   ?She is drinking 1 galloon of water per day.  ?Hiking, zumba during the week.  She exercises daily.  Denies chest pain or shortness of breath. ? ?She has SOAR form to participate in 3 mile hike. ? ?HTN- compliant with carvedilol 6.25 mg twice daily; she recently started hctz 12.'5mg'$  qhs  for 2 weeks. She is compliant with amlodipine 5 mg qhs.  ? ? ?HISTORY:  ?Past Medical History:  ?Diagnosis Date  ? Esophageal ulcer 2000  ? Required blood transfusion  ? HTN (hypertension)   ? Hyperlipidemia   ? Tried statin but had severe myalgia  ? Shingles 2001  ? Left chest  ? Skin cancer 2018  ? left shoulder  ? Vaginal delivery   ? x1  ? ?Past Surgical History:  ?Procedure Laterality Date  ? ABDOMINAL HYSTERECTOMY    ? no cervix seen on pelvic exam; 12/2016, Alicia Ramos  ? COLONOSCOPY WITH PROPOFOL N/A 07/15/2017  ? Procedure: COLONOSCOPY WITH PROPOFOL;  Surgeon: Alicia Silvas, MD;  Location: [facility] ENDOSCOPY;  Service: Endoscopy;  Laterality: N/A;  ? ?Family History  ?Problem Relation Age of Onset  ? Hypertension Mother   ? Lung cancer Father 49  ? Throat cancer Father   ? Hypertension Sister   ? Breast cancer Paternal Aunt   ? Heart disease Maternal Uncle   ? Heart disease Maternal Aunt   ? ? ?Allergies: Ace inhibitors, Losartan, and Statins ?Current Outpatient Medications on File Prior to Visit  ?Medication Sig Dispense Refill  ? amLODipine (NORVASC) 5 MG tablet TAKE 1 TABLET (5 MG TOTAL) BY MOUTH DAILY. 90 tablet 1  ? aspirin EC 81 MG tablet Take 1 tablet (81 mg total) by mouth daily. Swallow whole.    ? fenofibrate (TRICOR) 145 MG tablet Take 1 tablet (145 mg total) by mouth daily. 30 tablet 1   ? metFORMIN (GLUCOPHAGE-XR) 500 MG 24 hr tablet Take 3 tablets (1,500 mg total) by mouth every evening. 270 tablet 1  ? rosuvastatin (CRESTOR) 20 MG tablet Take 1 tablet (20 mg total) by mouth daily. 90 tablet 3  ? VITAMIN D PO Take 800 mg by mouth.    ? ?No current facility-administered medications on file prior to visit.  ? ? ?Social History  ? ?Tobacco Use  ? Smoking status: Former  ?  Packs/day: 1.00  ?  Years: 39.00  ?  Pack years: 39.00  ?  Types: Cigarettes  ?  Quit date: 08/01/2003  ?  Years since quitting: 18.1  ? Smokeless tobacco: Never  ?Substance Use Topics  ? Alcohol use: Yes  ?  Comment: Occasional  ? ? ?Review of Systems  ?Constitutional:  Negative for chills and fever.  ?Respiratory:  Negative for cough.   ?Cardiovascular:  Negative for chest pain and palpitations.  ?Gastrointestinal:  Negative for nausea and vomiting.  ?   ?Objective:  ?  ?BP 128/68 (BP Location: Left Arm, Patient Position: Sitting, Cuff Size: Large)   Pulse 86   Temp 98 ?F (36.7 ?C) (Oral)   Ht '5\' 9"'$  (1.753 m)   Wt 204 lb 3.2 oz (92.6 kg)  SpO2 98%   BMI 30.16 kg/m?  ?BP Readings from Last 3 Encounters:  ?09/30/21 128/68  ?08/30/21 (!) 146/86  ?05/24/21 128/82  ? ?Wt Readings from Last 3 Encounters:  ?09/30/21 204 lb 3.2 oz (92.6 kg)  ?08/30/21 202 lb 12.8 oz (92 kg)  ?05/24/21 203 lb 6.4 oz (92.3 kg)  ? ? ?Physical Exam ?Vitals reviewed.  ?Constitutional:   ?   Appearance: She is well-developed.  ?Eyes:  ?   Conjunctiva/sclera: Conjunctivae normal.  ?Cardiovascular:  ?   Rate and Rhythm: Normal rate and regular rhythm.  ?   Pulses: Normal pulses.  ?   Heart sounds: Normal heart sounds.  ?Pulmonary:  ?   Effort: Pulmonary effort is normal.  ?   Breath sounds: Normal breath sounds. No wheezing, rhonchi or rales.  ?Skin: ?   General: Skin is warm and dry.  ?Neurological:  ?   Mental Status: She is alert.  ?Psychiatric:     ?   Speech: Speech normal.     ?   Behavior: Behavior normal.     ?   Thought Content: Thought content  normal.  ? ? ?   ?Assessment & Plan:  ? ?Problem List Items Addressed This Visit   ? ?  ? Cardiovascular and Mediastinum  ? Hypertension - Primary  ?  Improved.  She is urinating more frequently on hydrochlorothiazide which is more bothersome for her.  In an effort to simplify her regimen, we opted to discontinue hydrochlorothiazide.  I opted to increase carvedilol to 12.5 mg twice daily.  She will continue amlodipine 5 mg.  She will let me know how she is doing.  Pending BMP today ?Of note: I have sent paperwork for her to continue to precipitate in hiking program in the absence of any cardiac concerns at this time. ?  ?  ? Relevant Medications  ? carvedilol (COREG) 12.5 MG tablet  ? Other Relevant Orders  ? Basic metabolic panel  ? ?Other Visit Diagnoses   ? ? History of osteopenia      ? Relevant Orders  ? VITAMIN D 25 Hydroxy (Vit-D Deficiency, Fractures)  ? ?  ? ? ? ?I have discontinued Alicia Ramos's carvedilol and hydrochlorothiazide. I am also having her start on carvedilol. Additionally, I am having her maintain her VITAMIN D PO, rosuvastatin, aspirin EC, amLODipine, metFORMIN, and fenofibrate. ? ? ?Meds ordered this encounter  ?Medications  ? carvedilol (COREG) 12.5 MG tablet  ?  Sig: Take 1 tablet (12.5 mg total) by mouth 2 (two) times daily with a meal.  ?  Dispense:  120 tablet  ?  Refill:  3  ?  Order Specific Question:   Supervising Provider  ?  Answer:   Alicia Ramos [[id number]]  ? ? ?Return precautions given.  ? ?Risks, benefits, and alternatives of the medications and treatment plan prescribed today were discussed, and patient expressed understanding.  ? ?Education regarding symptom management and diagnosis given to patient on AVS. ? ?Continue to follow with Alicia Hawthorne, FNP for routine health maintenance.  ? ?Alicia Ramos and I agreed with plan.  ? ?Alicia Paris, FNP ? ? ?

## 2021-10-14 ENCOUNTER — Other Ambulatory Visit (INDEPENDENT_AMBULATORY_CARE_PROVIDER_SITE_OTHER): Payer: Medicare HMO

## 2021-10-14 ENCOUNTER — Other Ambulatory Visit: Payer: Self-pay

## 2021-10-14 ENCOUNTER — Telehealth: Payer: Self-pay | Admitting: Family

## 2021-10-14 DIAGNOSIS — E119 Type 2 diabetes mellitus without complications: Secondary | ICD-10-CM

## 2021-10-14 DIAGNOSIS — E782 Mixed hyperlipidemia: Secondary | ICD-10-CM

## 2021-10-14 DIAGNOSIS — I10 Essential (primary) hypertension: Secondary | ICD-10-CM

## 2021-10-14 DIAGNOSIS — Z13228 Encounter for screening for other metabolic disorders: Secondary | ICD-10-CM | POA: Diagnosis not present

## 2021-10-14 NOTE — Telephone Encounter (Signed)
Patient called and said Alicia Ramos wanted to re-check sodium.No orders for labs. ?

## 2021-10-15 ENCOUNTER — Other Ambulatory Visit: Payer: Self-pay | Admitting: *Deleted

## 2021-10-15 DIAGNOSIS — I10 Essential (primary) hypertension: Secondary | ICD-10-CM

## 2021-10-15 LAB — LIPID PANEL
Chol/HDL Ratio: 2.4 ratio (ref 0.0–4.4)
Cholesterol, Total: 125 mg/dL (ref 100–199)
HDL: 53 mg/dL (ref >39)
LDL Chol Calc (NIH): 52 mg/dL (ref 0–99)
Triglycerides: 113 mg/dL (ref 0–149)
VLDL Cholesterol Cal: 20 mg/dL (ref 5–40)

## 2021-10-15 NOTE — Telephone Encounter (Signed)
Someone from another office accidentally released the CMP yesterday that was ordered from our office. I have called [facility] to see if they can add on to the labs she had yesterday at [facility]. You should receive a fax ti sign and fax back for them to run the test. ?

## 2021-10-16 NOTE — Telephone Encounter (Signed)
Signed this and put on your desk ?

## 2021-10-17 NOTE — Telephone Encounter (Signed)
Patient in question note has resulted already and sent to provider whose name was on the labs in the beginning Per [place]. ?

## 2021-10-18 ENCOUNTER — Ambulatory Visit: Payer: Medicare HMO | Admitting: Cardiology

## 2021-10-18 ENCOUNTER — Telehealth: Payer: Self-pay | Admitting: Cardiology

## 2021-10-18 NOTE — Telephone Encounter (Signed)
Attempted to call the patient. °No answer- I left a detailed message of results on her voice mail (ok per DPR). °  °I asked that she call back with any further questions/ concerns.  °

## 2021-10-18 NOTE — Telephone Encounter (Signed)
Call patient ?I reviewed labs as ordered by Dr. Charlestine Night, cardiology on 10/14/2021 sodium had normalized.  Cholesterol is excellent. ?

## 2021-10-18 NOTE — Telephone Encounter (Signed)
Kate Sable, MD  ?10/16/2021  4:58 PM EDT   ?  ?Cholesterol is adequately controlled.  Continue medications as prescribed.  ? ?

## 2021-10-18 NOTE — Telephone Encounter (Signed)
LMTCB

## 2021-10-21 ENCOUNTER — Ambulatory Visit: Payer: Medicare HMO | Admitting: Family

## 2021-10-21 LAB — COMPREHENSIVE METABOLIC PANEL
ALT: 22 IU/L (ref 0–32)
AST: 17 IU/L (ref 0–40)
Albumin/Globulin Ratio: 1.6 (ref 1.2–2.2)
Albumin: 4.8 g/dL — ABNORMAL HIGH (ref 3.7–4.7)
Alkaline Phosphatase: 48 IU/L (ref 44–121)
BUN/Creatinine Ratio: 17 (ref 12–28)
BUN: 17 mg/dL (ref 8–27)
Bilirubin Total: 0.2 mg/dL (ref 0.0–1.2)
CO2: 20 mmol/L (ref 20–29)
Calcium: 9.8 mg/dL (ref 8.7–10.3)
Chloride: 101 mmol/L (ref 96–106)
Creatinine, Ser: 0.99 mg/dL (ref 0.57–1.00)
Globulin, Total: 3 g/dL (ref 1.5–4.5)
Glucose: 116 mg/dL — ABNORMAL HIGH (ref 70–99)
Potassium: 5.1 mmol/L (ref 3.5–5.2)
Sodium: 138 mmol/L (ref 134–144)
Total Protein: 7.8 g/dL (ref 6.0–8.5)
eGFR: 61 mL/min/{1.73_m2} (ref >59)

## 2021-10-21 LAB — SPECIMEN STATUS REPORT

## 2021-10-23 NOTE — Telephone Encounter (Signed)
Spoke to patient about results. Patient verbalized understanding ?

## 2021-10-28 ENCOUNTER — Ambulatory Visit (INDEPENDENT_AMBULATORY_CARE_PROVIDER_SITE_OTHER): Payer: Medicare HMO | Admitting: Family

## 2021-10-28 ENCOUNTER — Encounter: Payer: Self-pay | Admitting: Family

## 2021-10-28 VITALS — BP 148/72 | HR 76 | Temp 98.0°F | Ht 69.0 in | Wt 203.8 lb

## 2021-10-28 DIAGNOSIS — I10 Essential (primary) hypertension: Secondary | ICD-10-CM

## 2021-10-28 MED ORDER — AMLODIPINE BESYLATE 2.5 MG PO TABS: 2.5000 mg | ORAL_TABLET | Freq: Every day | ORAL | 2 refills | Status: DC

## 2021-10-28 MED ORDER — CARVEDILOL 6.25 MG PO TABS: 6.2500 mg | ORAL_TABLET | Freq: Two times a day (BID) | ORAL | 1 refills | Status: DC

## 2021-10-28 NOTE — Progress Notes (Signed)
? ?Subjective:  ? ? Patient ID: Alicia Ramos, female    DOB: 1950-11-03, 71 y.o.   MRN: [id number] ? ?CC: AMAANI Ramos is a 71 y.o. female who presents today for follow up.  ? ?HPI: Acute visit for episodic dizziness x2 weeks ?Dizziness associated with position changes such as standing ?Onset correlates with increase Coreg to 12.'5mg'$  BID. She felt dizzy at yoga with position changes.  ?She is compliant with amlodipine '5mg'$  ?She doesn't feel dizzy when sitting ?Dizziness is not associated with palpitations, cp, sob, HA, vision loss, syncope.  ?She is eating normally. ? ?H/o hyperkalemia on losartan ?She had been on lisinopril in the past which worked well for her. She stopped the lisinopril due to suspected cough from medication.  ? ? ?This morning she was on the phone with our triage which was frustrating for her and she thinks it is raised her blood pressure ?HISTORY:  ?Past Medical History:  ?Diagnosis Date  ? Esophageal ulcer 2000  ? Required blood transfusion  ? HTN (hypertension)   ? Hyperlipidemia   ? Tried statin but had severe myalgia  ? Shingles 2001  ? Left chest  ? Skin cancer 2018  ? left shoulder  ? Vaginal delivery   ? x1  ? ?Past Surgical History:  ?Procedure Laterality Date  ? ABDOMINAL HYSTERECTOMY    ? no cervix seen on pelvic exam; 12/2016, Alicia Ramos  ? COLONOSCOPY WITH PROPOFOL N/A 07/15/2017  ? Procedure: COLONOSCOPY WITH PROPOFOL;  Surgeon: Alicia Silvas, MD;  Location: [facility] ENDOSCOPY;  Service: Endoscopy;  Laterality: N/A;  ? ?Family History  ?Problem Relation Age of Onset  ? Hypertension Mother   ? Lung cancer Father 22  ? Throat cancer Father   ? Hypertension Sister   ? Breast cancer Paternal Aunt   ? Heart disease Maternal Uncle   ? Heart disease Maternal Aunt   ? ? ?Allergies: Ace inhibitors, Losartan, and Statins ?Current Outpatient Medications on File Prior to Visit  ?Medication Sig Dispense Refill  ? amLODipine (NORVASC) 5 MG tablet TAKE 1 TABLET (5 MG TOTAL) BY MOUTH DAILY. 90 tablet  1  ? aspirin EC 81 MG tablet Take 1 tablet (81 mg total) by mouth daily. Swallow whole.    ? fenofibrate (TRICOR) 145 MG tablet Take 1 tablet (145 mg total) by mouth daily. 30 tablet 1  ? metFORMIN (GLUCOPHAGE-XR) 500 MG 24 hr tablet Take 3 tablets (1,500 mg total) by mouth every evening. 270 tablet 1  ? rosuvastatin (CRESTOR) 20 MG tablet Take 1 tablet (20 mg total) by mouth daily. 90 tablet 3  ? VITAMIN D PO Take 800 mg by mouth.    ? ?No current facility-administered medications on file prior to visit.  ? ? ?Social History  ? ?Tobacco Use  ? Smoking status: Former  ?  Packs/day: 1.00  ?  Years: 39.00  ?  Pack years: 39.00  ?  Types: Cigarettes  ?  Quit date: 08/01/2003  ?  Years since quitting: 18.2  ? Smokeless tobacco: Never  ?Substance Use Topics  ? Alcohol use: Yes  ?  Comment: Occasional  ? ? ?Review of Systems  ?Constitutional:  Negative for chills and fever.  ?Eyes:  Negative for visual disturbance.  ?Respiratory:  Negative for cough.   ?Cardiovascular:  Negative for chest pain and palpitations.  ?Gastrointestinal:  Negative for nausea and vomiting.  ?Neurological:  Positive for dizziness. Negative for syncope.  ?   ?Objective:  ?  ?BP Marland Kitchen)  148/72 (BP Location: Right Arm, Cuff Size: Large)   Pulse 76   Temp 98 ?F (36.7 ?C) (Oral)   Ht '5\' 9"'$  (1.753 m)   Wt 203 lb 12.8 oz (92.4 kg)   SpO2 98%   BMI 30.10 kg/m?  ?BP Readings from Last 3 Encounters:  ?10/28/21 (!) 148/72  ?09/30/21 128/68  ?08/30/21 (!) 146/86  ? ?Wt Readings from Last 3 Encounters:  ?10/28/21 203 lb 12.8 oz (92.4 kg)  ?09/30/21 204 lb 3.2 oz (92.6 kg)  ?08/30/21 202 lb 12.8 oz (92 kg)  ? ? ?Physical Exam ?Vitals reviewed.  ?Constitutional:   ?   Appearance: She is well-developed.  ?Eyes:  ?   Conjunctiva/sclera: Conjunctivae normal.  ?Cardiovascular:  ?   Rate and Rhythm: Normal rate and regular rhythm.  ?   Pulses: Normal pulses.  ?   Heart sounds: Normal heart sounds.  ?Pulmonary:  ?   Effort: Pulmonary effort is normal.  ?   Breath  sounds: Normal breath sounds. No wheezing, rhonchi or rales.  ?Skin: ?   General: Skin is warm and dry.  ?Neurological:  ?   Mental Status: She is alert.  ?Psychiatric:     ?   Speech: Speech normal.     ?   Behavior: Behavior normal.     ?   Thought Content: Thought content normal.  ? ? ?   ?Assessment & Plan:  ? ?Problem List Items Addressed This Visit   ? ?  ? Cardiovascular and Mediastinum  ? Hypertension - Primary  ?  Slightly elevated today.   suspect stress of getting here this morning and triage may have contributed to elevated blood pressure. ?History of hypokalemia on losartan.  Reported history of cough on lisinopril.  Urinary frequency HCTZ.  Bilateral edema on amlodipine 10 mg. ?We opted to decrease Coreg to previous dose 6.25 mg twice daily and closely monitor to ensure dizziness completely resolves.  Encouraged hydration and ensuring that she is eating frequently especially when working out.  We opted to increase amlodipine to 7.5 mg with close vigilance as it relates to lower extremity edema.  She will let me know how she is doing.    May consider retrial of lisinopril has worked very well for her in the past with close vigilance for hypokalemia,cough ?  ?  ? Relevant Medications  ? carvedilol (COREG) 6.25 MG tablet  ? amLODipine (NORVASC) 2.5 MG tablet  ? ? ? ?I have changed Alicia Ramos's carvedilol. I am also having her start on amLODipine. Additionally, I am having her maintain her VITAMIN D PO, rosuvastatin, aspirin EC, amLODipine, metFORMIN, and fenofibrate. ? ? ?Meds ordered this encounter  ?Medications  ? carvedilol (COREG) 6.25 MG tablet  ?  Sig: Take 1 tablet (6.25 mg total) by mouth 2 (two) times daily with a meal.  ?  Dispense:  90 tablet  ?  Refill:  1  ?  Order Specific Question:   Supervising Provider  ?  Answer:   Alicia Ramos [[id number]]  ? amLODipine (NORVASC) 2.5 MG tablet  ?  Sig: Take 1 tablet (2.5 mg total) by mouth daily.  ?  Dispense:  90 tablet  ?  Refill:  2  ?  Order  Specific Question:   Supervising Provider  ?  Answer:   Alicia Ramos [[id number]]  ? ? ?Return precautions given.  ? ?Risks, benefits, and alternatives of the medications and treatment plan prescribed today were discussed, and patient expressed  understanding.  ? ?Education regarding symptom management and diagnosis given to patient on AVS. ? ?Continue to follow with [name], FNP for routine health maintenance.  ? ?Alicia Ramos and I agreed with plan.  ? ?[name], FNP ? ? ?

## 2021-10-28 NOTE — Patient Instructions (Addendum)
Decrease coreg back to 6.'25mg'$  twice per day ? ?I have added amlodipine 2.5 mg which you can add to your 5 mg tablet for total of 7.5 mg.  Please monitor for leg swelling ? ?Please drink plenty water and make sure you are eating small frequent meals ? ? ?Please let me know if your dizziness does not improve rapidly or lowering the Coreg dose. ? ? ? ?

## 2021-10-28 NOTE — Assessment & Plan Note (Addendum)
Slightly elevated today.   suspect stress of getting here this morning and triage may have contributed to elevated blood pressure. ?History of hypokalemia on losartan.  Reported history of cough on lisinopril.  Urinary frequency HCTZ.  Bilateral edema on amlodipine 10 mg. ?We opted to decrease Coreg to previous dose 6.25 mg twice daily and closely monitor to ensure dizziness completely resolves.  Encouraged hydration and ensuring that she is eating frequently especially when working out.  We opted to increase amlodipine to 7.5 mg with close vigilance as it relates to lower extremity edema.  She will let me know how she is doing.    May consider retrial of lisinopril has worked very well for her in the past with close vigilance for hypokalemia,cough ?

## 2021-11-14 ENCOUNTER — Other Ambulatory Visit: Payer: Self-pay | Admitting: *Deleted

## 2021-11-14 MED ORDER — FENOFIBRATE 145 MG PO TABS: 145.0000 mg | ORAL_TABLET | Freq: Every day | ORAL | 1 refills | Status: DC

## 2021-11-15 ENCOUNTER — Encounter: Payer: Self-pay | Admitting: Cardiology

## 2021-11-15 ENCOUNTER — Ambulatory Visit: Payer: Medicare HMO | Admitting: Cardiology

## 2021-11-15 VITALS — BP 144/84 | HR 90 | Ht 69.0 in | Wt 200.0 lb

## 2021-11-15 DIAGNOSIS — I2510 Atherosclerotic heart disease of native coronary artery without angina pectoris: Secondary | ICD-10-CM | POA: Diagnosis not present

## 2021-11-15 DIAGNOSIS — I10 Essential (primary) hypertension: Secondary | ICD-10-CM

## 2021-11-15 DIAGNOSIS — E782 Mixed hyperlipidemia: Secondary | ICD-10-CM | POA: Diagnosis not present

## 2021-11-15 MED ORDER — HYDROCHLOROTHIAZIDE 25 MG PO TABS: 12.5000 mg | ORAL_TABLET | Freq: Every day | ORAL | 3 refills | Status: DC

## 2021-11-15 NOTE — Patient Instructions (Signed)
Medication Instructions:  ? ?Your physician has recommended you make the following change in your medication:  ? ? START taking Hydrochlorothiazide 12.5 MG once a day. ? ?*If you need a refill on your cardiac medications before your next appointment, please call your pharmacy* ? ? ?Lab Work: ? ?None ordered ? ?If you have labs (blood work) drawn today and your tests are completely normal, you will receive your results only by: ?MyChart Message (if you have MyChart) OR ?A paper copy in the mail ?If you have any lab test that is abnormal or we need to change your treatment, we will call you to review the results. ? ? ?Testing/Procedures: ? ?None ordered ? ? ?Follow-Up: ?At [facility], you and your health needs are our priority.  As part of our continuing mission to provide you with exceptional heart care, we have created designated Provider Care Teams.  These Care Teams include your primary Cardiologist (physician) and Advanced Practice Providers (APPs -  Physician Assistants and Nurse Practitioners) who all work together to provide you with the care you need, when you need it. ? ?We recommend signing up for the patient portal called "MyChart".  Sign up information is provided on this After Visit Summary.  MyChart is used to connect with patients for Virtual Visits (Telemedicine).  Patients are able to view lab/test results, encounter notes, upcoming appointments, etc.  Non-urgent messages can be sent to your provider as well.   ?To learn more about what you can do with MyChart, go to [email or web address].   ? ?Your next appointment:   ?4-6 week(s) ? ?The format for your next appointment:   ?In Person ? ?Provider:   ?You may see Kate Sable, MD or one of the following Advanced Practice Providers on your designated Care Team:   ?Murray Hodgkins, NP ?Christell Faith, PA-C ?Cadence Kathlen Mody, PA-C  ? ? ?Other Instructions ? ? ?Important Information About Sugar ? ? ? ? ? ? ?

## 2021-11-15 NOTE — Progress Notes (Signed)
?Cardiology Office Note:   ? ?Date:  11/15/2021  ? ?ID:  [name], DOB 08/03/1950, MRN [id number] ? ?PCP:  [name], FNP ?  ?[facility] Providers ?Cardiologist:  Kate Sable, MD    ? ?Referring MD: [name], FNP  ? ?Chief Complaint  ?Patient presents with  ? OTher  ?  6 month follow up -- Meds reviewed verbally with patient.   ? ? ?History of Present Illness:   ? ?Alicia Ramos is a 71 y.o. female with a hx of CAD (Cor calcification on CT), hypertension, hyperlipidemia, diabetes, former smoker x30+ years who presents for follow-up.   ? ?Being seen for hyperlipidemia and hypertension.  BP not adequately controlled.  Takes Crestor and fenofibrate for cholesterol control.  Has had recent lipid panel.  BP still elevated at home with systolic in the 976B, Coreg increased by PCP but patient cannot tolerate, felt sick.  Amlodipine was increased to 10 mg but this resulted in edema, dose reduced to 7.5 mg daily.   ? ? ?Prior notes  ?Echo 02/2021 EF 60 to 34%, grade 2 diastolic dysfunction. ?she had a screening chest CT due to prior history of smoking in 2019 showing three-vessel coronary artery disease.   ? ?Past Medical History:  ?Diagnosis Date  ? Esophageal ulcer 2000  ? Required blood transfusion  ? HTN (hypertension)   ? Hyperlipidemia   ? Tried statin but had severe myalgia  ? Shingles 2001  ? Left chest  ? Skin cancer 2018  ? left shoulder  ? Vaginal delivery   ? x1  ? ? ?Past Surgical History:  ?Procedure Laterality Date  ? ABDOMINAL HYSTERECTOMY    ? no cervix seen on pelvic exam; 12/2016, [place]  ? COLONOSCOPY WITH PROPOFOL N/A 07/15/2017  ? Procedure: COLONOSCOPY WITH PROPOFOL;  Surgeon: Manya Silvas, MD;  Location: [facility] ENDOSCOPY;  Service: Endoscopy;  Laterality: N/A;  ? ? ?Current Medications: ?Current Meds  ?Medication Sig  ? amLODipine (NORVASC) 2.5 MG tablet Take 1 tablet (2.5 mg total) by mouth daily.  ? amLODipine (NORVASC) 5 MG tablet TAKE 1 TABLET (5 MG TOTAL) BY  MOUTH DAILY.  ? aspirin EC 81 MG tablet Take 1 tablet (81 mg total) by mouth daily. Swallow whole.  ? carvedilol (COREG) 6.25 MG tablet Take 1 tablet (6.25 mg total) by mouth 2 (two) times daily with a meal.  ? fenofibrate (TRICOR) 145 MG tablet Take 1 tablet (145 mg total) by mouth daily.  ? hydrochlorothiazide (HYDRODIURIL) 25 MG tablet Take 0.5 tablets (12.5 mg total) by mouth daily.  ? metFORMIN (GLUCOPHAGE-XR) 500 MG 24 hr tablet Take 3 tablets (1,500 mg total) by mouth every evening.  ? rosuvastatin (CRESTOR) 20 MG tablet Take 1 tablet (20 mg total) by mouth daily.  ? VITAMIN D PO Take 800 mg by mouth.  ?  ? ?Allergies:   Ace inhibitors, Losartan, and Statins  ? ?Social History  ? ?Socioeconomic History  ? Marital status: Divorced  ?  Spouse name: Not on file  ? Number of children: Not on file  ? Years of education: Not on file  ? Highest education level: Not on file  ?Occupational History  ? Not on file  ?Tobacco Use  ? Smoking status: Former  ?  Packs/day: 1.00  ?  Years: 39.00  ?  Pack years: 39.00  ?  Types: Cigarettes  ?  Quit date: 08/01/2003  ?  Years since quitting: 18.3  ? Smokeless tobacco:  Never  ?Substance and Sexual Activity  ? Alcohol use: Yes  ?  Comment: Occasional  ? Drug use: Not on file  ? Sexual activity: Not Currently  ?Other Topics Concern  ? Not on file  ?Social History Narrative  ? Regular Exercise -  Yes  ? Lives in [place] w/son  ?   ?   ? ?Social Determinants of Health  ? ?Financial Resource Strain: Low Risk   ? Difficulty of Paying Living Expenses: Not hard at all  ?Food Insecurity: No Food Insecurity  ? Worried About Charity fundraiser in the Last Year: Never true  ? Ran Out of Food in the Last Year: Never true  ?Transportation Needs: No Transportation Needs  ? Lack of Transportation (Medical): No  ? Lack of Transportation (Non-Medical): No  ?Physical Activity: Sufficiently Active  ? Days of Exercise per Week: 3 days  ? Minutes of Exercise per Session: 60 min  ?Stress: No Stress  Concern Present  ? Feeling of Stress : Not at all  ?Social Connections: Unknown  ? Frequency of Communication with Friends and Family: More than three times a week  ? Frequency of Social Gatherings with Friends and Family: More than three times a week  ? Attends Religious Services: Not on file  ? Active Member of Clubs or Organizations: Not on file  ? Attends Archivist Meetings: Not on file  ? Marital Status: Not on file  ?  ? ?Family History: ?The patient's family history includes Breast cancer in her paternal aunt; Heart disease in her maternal aunt and maternal uncle; Hypertension in her mother and sister; Lung cancer (age of onset: 76) in her father; Throat cancer in her father. ? ?ROS:   ?Please see the history of present illness.    ? All other systems reviewed and are negative. ? ?EKGs/Labs/Other Studies Reviewed:   ? ?The following studies were reviewed today: ? ? ?EKG:  EKG not ordered today.   ? ?Recent Labs: ?10/14/2021: ALT 22; BUN 17; Creatinine, Ser 0.99; Potassium 5.1; Sodium 138  ?Recent Lipid Panel ?   ?Component Value Date/Time  ? CHOL 125 10/14/2021 0817  ? TRIG 113 10/14/2021 0817  ? HDL 53 10/14/2021 0817  ? CHOLHDL 2.4 10/14/2021 0817  ? CHOLHDL 3 05/24/2021 0842  ? VLDL 29.8 05/24/2021 0842  ? [place] 52 10/14/2021 0817  ? LDLDIRECT 83.0 11/14/2020 0837  ? ? ? ?Risk Assessment/Calculations:   ? ? ?    ? ?Physical Exam:   ? ?VS:  BP (!) 144/84 (BP Location: Left Arm, Patient Position: Sitting, Cuff Size: Normal)   Pulse 90   Ht '5\' 9"'$  (1.753 m)   Wt 200 lb (90.7 kg)   BMI 29.53 kg/m?    ? ?Wt Readings from Last 3 Encounters:  ?11/15/21 200 lb (90.7 kg)  ?10/28/21 203 lb 12.8 oz (92.4 kg)  ?09/30/21 204 lb 3.2 oz (92.6 kg)  ?  ? ?GEN:  Well nourished, well developed in no acute distress ?HEENT: Normal ?NECK: No JVD; No carotid bruits ?CARDIAC: RRR, no murmurs, rubs, gallops ?RESPIRATORY:  Clear to auscultation without rales, wheezing or rhonchi  ?ABDOMEN: Soft, non-tender,  non-distended ?MUSCULOSKELETAL:  No edema; No deformity  ?SKIN: Warm and dry ?NEUROLOGIC:  Alert and oriented x 3 ?PSYCHIATRIC:  Normal affect  ? ?ASSESSMENT:   ? ?1. Coronary artery disease involving native coronary artery of native heart without angina pectoris   ?2. Primary hypertension   ?3. Mixed hyperlipidemia   ? ?  PLAN:   ? ?In order of problems listed above: ? ?CAD, three-vessel coronary calcifications on chest CT 2019.  Denies chest pain or shortness of breath.  Continue aspirin 81 mg daily, continue Crestor.  Echo with EF 60 to 65%.  Grade 2 diastolic dysfunction. ?Hypertension, BP elevated today, start HCTZ 12.5 mg daily.  Continue Norvasc 7.5 mg, Coreg 6.25 mg twice daily.  Consider titrating HCTZ if BP stays elevated. ?Hyperlipidemia, hypertriglyceridemia.  Cholesterol adequately controlled.  Continue Crestor 20, fenofibrate 145.   ? ?Follow-up 6 weeks ? ? ?Medication Adjustments/Labs and Tests Ordered: ?Current medicines are reviewed at length with the patient today.  Concerns regarding medicines are outlined above.  ?Orders Placed This Encounter  ?Procedures  ? EKG 12-Lead  ? ? ? ?Meds ordered this encounter  ?Medications  ? hydrochlorothiazide (HYDRODIURIL) 25 MG tablet  ?  Sig: Take 0.5 tablets (12.5 mg total) by mouth daily.  ?  Dispense:  30 tablet  ?  Refill:  3  ? ? ? ? ?Patient Instructions  ?Medication Instructions:  ? ?Your physician has recommended you make the following change in your medication:  ? ? START taking Hydrochlorothiazide 12.5 MG once a day. ? ?*If you need a refill on your cardiac medications before your next appointment, please call your pharmacy* ? ? ?Lab Work: ? ?None ordered ? ?If you have labs (blood work) drawn today and your tests are completely normal, you will receive your results only by: ?MyChart Message (if you have MyChart) OR ?A paper copy in the mail ?If you have any lab test that is abnormal or we need to change your treatment, we will call you to review the  results. ? ? ?Testing/Procedures: ? ?None ordered ? ? ?Follow-Up: ?At [facility], you and your health needs are our priority.  As part of our continuing mission to provide you with exceptional heart care, we have cr

## 2021-11-16 ENCOUNTER — Other Ambulatory Visit: Payer: Self-pay | Admitting: Family

## 2021-11-27 ENCOUNTER — Ambulatory Visit: Payer: Medicare HMO | Admitting: Family

## 2021-12-09 ENCOUNTER — Telehealth: Payer: Self-pay | Admitting: Cardiology

## 2021-12-09 NOTE — Telephone Encounter (Signed)
-----   Message from Horton Finer sent at 12/09/2021  9:33 AM EDT ----- ?Regarding: reschedule appt ?Please reschedule 6 week F/U appointment for further refills. Patient canceled last scheduled office visit. Thank you! ? ?

## 2021-12-09 NOTE — Telephone Encounter (Signed)
Spoke with patient, not ready to schedule ?Advised to contact PCP for refills ?

## 2021-12-11 ENCOUNTER — Other Ambulatory Visit: Payer: Self-pay

## 2021-12-11 MED ORDER — FENOFIBRATE 145 MG PO TABS
145.0000 mg | ORAL_TABLET | Freq: Every day | ORAL | 1 refills | Status: DC
Start: 2021-12-11 — End: 2022-01-06

## 2021-12-13 ENCOUNTER — Ambulatory Visit: Payer: Medicare HMO | Admitting: Cardiology

## 2021-12-16 ENCOUNTER — Encounter: Payer: Self-pay | Admitting: Family

## 2021-12-16 ENCOUNTER — Ambulatory Visit (INDEPENDENT_AMBULATORY_CARE_PROVIDER_SITE_OTHER): Payer: Medicare HMO | Admitting: Family

## 2021-12-16 VITALS — BP 152/84 | HR 85 | Temp 97.8°F | Ht 69.0 in | Wt 200.4 lb

## 2021-12-16 DIAGNOSIS — E119 Type 2 diabetes mellitus without complications: Secondary | ICD-10-CM | POA: Diagnosis not present

## 2021-12-16 DIAGNOSIS — I10 Essential (primary) hypertension: Secondary | ICD-10-CM | POA: Diagnosis not present

## 2021-12-16 LAB — POCT GLYCOSYLATED HEMOGLOBIN (HGB A1C): Hemoglobin A1C: 6.2 % — AB (ref 4.0–5.6)

## 2021-12-16 MED ORDER — LISINOPRIL 10 MG PO TABS: 10.0000 mg | ORAL_TABLET | Freq: Every day | ORAL | 3 refills | Status: DC

## 2021-12-16 NOTE — Patient Instructions (Signed)
Start lisinopril '10mg'$   Check labs in one week  Nice to see you!

## 2021-12-16 NOTE — Assessment & Plan Note (Signed)
Lab Results  Component Value Date   HGBA1C 6.2 (A) 12/16/2021   Excellent control. Continue metformin '1500mg'$  qd

## 2021-12-16 NOTE — Assessment & Plan Note (Signed)
Blood pressure elevated today and similar to blood pressure readings at home.  We had a long discussion in regards to medications that we could trial.  We could retrial hydrochlorothiazide 12.5 mg and watch for electrolyte balance.  We discussed spironolactone.  I expressed hesitancy in starting spironolactone as  potassium previously was 5.1.  We also discussed lisinopril and suspected history of cough.  We discussed  cough could have been related to acid reflux, PND, and reasonable to retiral.  CMP today and again in 1 week's time to monitor electrolytes . patient will stay vigilant for recurrence of cough.  Continue amlodipine 7.5 mg, Coreg 6.25 mg twice daily.

## 2021-12-16 NOTE — Progress Notes (Signed)
Subjective:    Patient ID: Alicia Ramos, female    DOB: 06-17-1951, 71 y.o.   MRN: [id number]  CC: Alicia Ramos is a 71 y.o. female who presents today for follow up.   HPI: Feels well today No new complaints  She is cutting down on salt.   BP at home 140/80. Denies CP, sob.   HTN- compliant with amlodipine 7.'5mg'$  without leg swelling Compliant with coreg 6.'25mg'$  bid.  She reports lisinopril was most effective for managing her blood pressure.  She is not sure if the cough at that time was related to lisinopril or perhaps her history of smoking.  She does not have a cough at this time. Consult with Dr. Charlestine Night 11/15/2021. He started hctz 12.'5mg'$  however she didn't start as concerned due to low sodium previously.   DM-compliant with metformin 1500 mg  HISTORY:  Past Medical History:  Diagnosis Date   Esophageal ulcer 2000   Required blood transfusion   HTN (hypertension)    Hyperlipidemia    Tried statin but had severe myalgia   Shingles 2001   Left chest   Skin cancer 2018   left shoulder   Vaginal delivery    x1   Past Surgical History:  Procedure Laterality Date   ABDOMINAL HYSTERECTOMY     no cervix seen on pelvic exam; 12/2016, Alicia Ramos   COLONOSCOPY WITH PROPOFOL N/A 07/15/2017   Procedure: COLONOSCOPY WITH PROPOFOL;  Surgeon: Manya Silvas, MD;  Location: [facility] ENDOSCOPY;  Service: Endoscopy;  Laterality: N/A;   Family History  Problem Relation Age of Onset   Hypertension Mother    Lung cancer Father 66   Throat cancer Father    Hypertension Sister    Breast cancer Paternal Aunt    Heart disease Maternal Uncle    Heart disease Maternal Aunt     Allergies: Ace inhibitors, Losartan, and Statins Current Outpatient Medications on File Prior to Visit  Medication Sig Dispense Refill   amLODipine (NORVASC) 2.5 MG tablet Take 1 tablet (2.5 mg total) by mouth daily. 90 tablet 2   amLODipine (NORVASC) 5 MG tablet TAKE 1 TABLET (5 MG TOTAL) BY MOUTH DAILY. 90 tablet  1   aspirin EC 81 MG tablet Take 1 tablet (81 mg total) by mouth daily. Swallow whole.     carvedilol (COREG) 6.25 MG tablet Take 1 tablet (6.25 mg total) by mouth 2 (two) times daily with a meal. 90 tablet 1   fenofibrate (TRICOR) 145 MG tablet Take 1 tablet (145 mg total) by mouth daily. 30 tablet 1   metFORMIN (GLUCOPHAGE-XR) 500 MG 24 hr tablet Take 3 tablets (1,500 mg total) by mouth every evening. 270 tablet 1   rosuvastatin (CRESTOR) 20 MG tablet TAKE 1 TABLET BY MOUTH EVERY DAY 90 tablet 3   VITAMIN D PO Take 800 mg by mouth.     No current facility-administered medications on file prior to visit.    Social History   Tobacco Use   Smoking status: Former    Packs/day: 1.00    Years: 39.00    Pack years: 39.00    Types: Cigarettes    Quit date: 08/01/2003    Years since quitting: 18.3   Smokeless tobacco: Never  Substance Use Topics   Alcohol use: Yes    Comment: Occasional    Review of Systems  Constitutional:  Negative for chills and fever.  Respiratory:  Negative for cough.   Cardiovascular:  Negative for chest pain, palpitations and  leg swelling.  Gastrointestinal:  Negative for nausea and vomiting.     Objective:    BP (!) 152/84 (BP Location: Left Arm, Patient Position: Sitting, Cuff Size: Large)   Pulse 85   Temp 97.8 F (36.6 C) (Oral)   Ht '5\' 9"'$  (1.753 m)   Wt 200 lb 6.4 oz (90.9 kg)   SpO2 97%   BMI 29.59 kg/m  BP Readings from Last 3 Encounters:  12/16/21 (!) 152/84  11/15/21 (!) 144/84  10/28/21 (!) 148/72   Wt Readings from Last 3 Encounters:  12/16/21 200 lb 6.4 oz (90.9 kg)  11/15/21 200 lb (90.7 kg)  10/28/21 203 lb 12.8 oz (92.4 kg)    Physical Exam Vitals reviewed.  Constitutional:      Appearance: She is well-developed.  Eyes:     Conjunctiva/sclera: Conjunctivae normal.  Cardiovascular:     Rate and Rhythm: Normal rate and regular rhythm.     Pulses: Normal pulses.     Heart sounds: Normal heart sounds.  Pulmonary:     Effort:  Pulmonary effort is normal.     Breath sounds: Normal breath sounds. No wheezing, rhonchi or rales.  Skin:    General: Skin is warm and dry.  Neurological:     Mental Status: She is alert.  Psychiatric:        Speech: Speech normal.        Behavior: Behavior normal.        Thought Content: Thought content normal.       Assessment & Plan:   Problem List Items Addressed This Visit       Cardiovascular and Mediastinum   Hypertension - Primary    Blood pressure elevated today and similar to blood pressure readings at home.  We had a long discussion in regards to medications that we could trial.  We could retrial hydrochlorothiazide 12.5 mg and watch for electrolyte balance.  We discussed spironolactone.  I expressed hesitancy in starting spironolactone as  potassium previously was 5.1.  We also discussed lisinopril and suspected history of cough.  We discussed  cough could have been related to acid reflux, PND, and reasonable to retiral.  CMP today and again in 1 week's time to monitor electrolytes . patient will stay vigilant for recurrence of cough.  Continue amlodipine 7.5 mg, Coreg 6.25 mg twice daily.       Relevant Medications   lisinopril (ZESTRIL) 10 MG tablet   Other Relevant Orders   Comprehensive metabolic panel   Basic metabolic panel     Endocrine   Diabetes mellitus without complication ([place])    Lab Results  Component Value Date   HGBA1C 6.2 (A) 12/16/2021  Excellent control. Continue metformin '1500mg'$  qd      Relevant Medications   lisinopril (ZESTRIL) 10 MG tablet   Other Relevant Orders   POCT HgB A1C (Completed)     I have discontinued Alicia Ramos's hydrochlorothiazide. I am also having her start on lisinopril. Additionally, I am having her maintain her VITAMIN D PO, aspirin EC, amLODipine, metFORMIN, carvedilol, amLODipine, rosuvastatin, and fenofibrate.   Meds ordered this encounter  Medications   lisinopril (ZESTRIL) 10 MG tablet    Sig: Take 1  tablet (10 mg total) by mouth daily.    Dispense:  90 tablet    Refill:  3    Order Specific Question:   Supervising Provider    Answer:   Crecencio Mc [[id number]]    Return precautions given.  Risks, benefits, and alternatives of the medications and treatment plan prescribed today were discussed, and patient expressed understanding.   Education regarding symptom management and diagnosis given to patient on AVS.  Continue to follow with [name], FNP for routine health maintenance.   Alicia Ramos and I agreed with plan.   [name], FNP

## 2021-12-17 ENCOUNTER — Telehealth: Payer: Self-pay

## 2021-12-17 LAB — COMPREHENSIVE METABOLIC PANEL
ALT: 17 U/L (ref 0–35)
AST: 17 U/L (ref 0–37)
Albumin: 4.9 g/dL (ref 3.5–5.2)
Alkaline Phosphatase: 41 U/L (ref 39–117)
BUN: 21 mg/dL (ref 6–23)
CO2: 26 mEq/L (ref 19–32)
Calcium: 9.9 mg/dL (ref 8.4–10.5)
Chloride: 98 mEq/L (ref 96–112)
Creatinine, Ser: 0.99 mg/dL (ref 0.40–1.20)
GFR: 57.18 mL/min — ABNORMAL LOW (ref >60.00)
Glucose, Bld: 92 mg/dL (ref 70–99)
Potassium: 4.1 mEq/L (ref 3.5–5.1)
Sodium: 133 mEq/L — ABNORMAL LOW (ref 135–145)
Total Bilirubin: 0.5 mg/dL (ref 0.2–1.2)
Total Protein: 7.6 g/dL (ref 6.0–8.3)

## 2021-12-17 NOTE — Telephone Encounter (Signed)
LMTCB to call back for labs.

## 2021-12-26 ENCOUNTER — Other Ambulatory Visit (INDEPENDENT_AMBULATORY_CARE_PROVIDER_SITE_OTHER): Payer: Medicare HMO

## 2021-12-26 DIAGNOSIS — I10 Essential (primary) hypertension: Secondary | ICD-10-CM

## 2021-12-26 LAB — BASIC METABOLIC PANEL
BUN: 16 mg/dL (ref 6–23)
CO2: 26 mEq/L (ref 19–32)
Calcium: 9.3 mg/dL (ref 8.4–10.5)
Chloride: 103 mEq/L (ref 96–112)
Creatinine, Ser: 0.97 mg/dL (ref 0.40–1.20)
GFR: 58.59 mL/min — ABNORMAL LOW (ref >60.00)
Glucose, Bld: 120 mg/dL — ABNORMAL HIGH (ref 70–99)
Potassium: 4.6 mEq/L (ref 3.5–5.1)
Sodium: 139 mEq/L (ref 135–145)

## 2022-01-02 DIAGNOSIS — D2262 Melanocytic nevi of left upper limb, including shoulder: Secondary | ICD-10-CM | POA: Diagnosis not present

## 2022-01-02 DIAGNOSIS — X32XXXA Exposure to sunlight, initial encounter: Secondary | ICD-10-CM | POA: Diagnosis not present

## 2022-01-02 DIAGNOSIS — Z08 Encounter for follow-up examination after completed treatment for malignant neoplasm: Secondary | ICD-10-CM | POA: Diagnosis not present

## 2022-01-02 DIAGNOSIS — D225 Melanocytic nevi of trunk: Secondary | ICD-10-CM | POA: Diagnosis not present

## 2022-01-02 DIAGNOSIS — Z85828 Personal history of other malignant neoplasm of skin: Secondary | ICD-10-CM | POA: Diagnosis not present

## 2022-01-02 DIAGNOSIS — D2272 Melanocytic nevi of left lower limb, including hip: Secondary | ICD-10-CM | POA: Diagnosis not present

## 2022-01-02 DIAGNOSIS — L570 Actinic keratosis: Secondary | ICD-10-CM | POA: Diagnosis not present

## 2022-01-06 ENCOUNTER — Other Ambulatory Visit: Payer: Self-pay

## 2022-01-06 MED ORDER — FENOFIBRATE 145 MG PO TABS: 145.0000 mg | ORAL_TABLET | Freq: Every day | ORAL | 0 refills | Status: DC

## 2022-01-07 ENCOUNTER — Other Ambulatory Visit: Payer: Self-pay | Admitting: Family

## 2022-01-07 ENCOUNTER — Telehealth: Payer: Self-pay | Admitting: Cardiology

## 2022-01-07 DIAGNOSIS — I10 Essential (primary) hypertension: Secondary | ICD-10-CM

## 2022-01-07 NOTE — Telephone Encounter (Signed)
-----   Message from Horton Finer sent at 01/06/2022  3:25 PM EDT ----- Regarding: needs appt Please reschedule F/U appointment for further refills. Patient cancelled last scheduled office visit. Thank you!

## 2022-01-07 NOTE — Telephone Encounter (Signed)
Patient declined to schedule States they will get refills from PCP

## 2022-01-30 ENCOUNTER — Other Ambulatory Visit: Payer: Self-pay

## 2022-01-30 NOTE — Telephone Encounter (Signed)
The patient has decided not to make a follow up appointment at this time. The patient will contact their primary care physician for any further refills on medications.

## 2022-03-03 ENCOUNTER — Other Ambulatory Visit: Payer: Self-pay

## 2022-03-03 MED ORDER — FENOFIBRATE 145 MG PO TABS
145.0000 mg | ORAL_TABLET | Freq: Every day | ORAL | 0 refills | Status: DC
Start: 2022-03-03 — End: 2022-04-16

## 2022-03-06 ENCOUNTER — Telehealth: Payer: Self-pay | Admitting: Family

## 2022-03-06 NOTE — Telephone Encounter (Signed)
Copied from [place] [id number]. Topic: Medicare AWV >> Mar 06, 2022 11:22 AM Devoria Glassing wrote: Reason for CRM: Left message for patient to schedule Annual Wellness Visit.  Please schedule with Nurse Health Advisor Denisa O'Brien-Blaney, LPN at [facility]. This appt can be telephone or office visit.  Please call [phone] ask for [facility]

## 2022-03-06 NOTE — Telephone Encounter (Signed)
Patient states she just missed our call.  I read information from [facility] message and gave her Kathy's number.  Patient states she will call Juliann Pulse.

## 2022-03-07 ENCOUNTER — Ambulatory Visit (INDEPENDENT_AMBULATORY_CARE_PROVIDER_SITE_OTHER): Payer: Medicare HMO

## 2022-03-07 VITALS — Ht 69.0 in | Wt 200.0 lb

## 2022-03-07 DIAGNOSIS — Z0000 Encounter for general adult medical examination without abnormal findings: Secondary | ICD-10-CM | POA: Diagnosis not present

## 2022-03-07 NOTE — Progress Notes (Signed)
Subjective:   Alicia Ramos is a 71 y.o. female who presents for Medicare Annual (Subsequent) preventive examination.  Review of Systems    No ROS.  Medicare Wellness Virtual Visit.  Visual/audio telehealth visit, UTA vital signs.   See social history for additional risk factors.   Cardiac Risk Factors include: advanced age (>62mn, >>44women);hypertension;diabetes mellitus     Objective:    Today's Vitals   03/07/22 0905  Weight: 200 lb (90.7 kg)  Height: '5\' 9"'$  (1.753 m)   Body mass index is 29.53 kg/m.     03/07/2022    9:13 AM 03/06/2021   10:40 AM 03/05/2020   10:45 AM 03/03/2019   10:47 AM  Advanced Directives  Does Patient Have a Medical Advance Directive? Yes No No No  Type of AParamedicof AMartinLiving will     Does patient want to make changes to medical advance directive? No - Patient declined No - Patient declined    Copy of HHoughtonin Chart? No - copy requested     Would patient like information on creating a medical advance directive?   No - Patient declined No - Patient declined    Current Medications (verified) Outpatient Encounter Medications as of 03/07/2022  Medication Sig   amLODipine (NORVASC) 2.5 MG tablet Take 1 tablet (2.5 mg total) by mouth daily.   amLODipine (NORVASC) 5 MG tablet TAKE 1 TABLET (5 MG TOTAL) BY MOUTH DAILY.   aspirin EC 81 MG tablet Take 1 tablet (81 mg total) by mouth daily. Swallow whole.   carvedilol (COREG) 6.25 MG tablet TAKE 1 TABLET BY MOUTH 2 TIMES DAILY WITH A MEAL.   fenofibrate (TRICOR) 145 MG tablet Take 1 tablet (145 mg total) by mouth daily. PLEASE SCHEDULE OFFICE VISIT FOR FURTHER REFILLS. THANK YOU!   lisinopril (ZESTRIL) 10 MG tablet Take 1 tablet (10 mg total) by mouth daily.   metFORMIN (GLUCOPHAGE-XR) 500 MG 24 hr tablet Take 3 tablets (1,500 mg total) by mouth every evening.   rosuvastatin (CRESTOR) 20 MG tablet TAKE 1 TABLET BY MOUTH EVERY DAY   VITAMIN D PO Take  800 mg by mouth.   No facility-administered encounter medications on file as of 03/07/2022.    Allergies (verified) Ace inhibitors, Losartan, and Statins   History: Past Medical History:  Diagnosis Date   Esophageal ulcer 2000   Required blood transfusion   HTN (hypertension)    Hyperlipidemia    Tried statin but had severe myalgia   Shingles 2001   Left chest   Skin cancer 2018   left shoulder   Vaginal delivery    x1   Past Surgical History:  Procedure Laterality Date   ABDOMINAL HYSTERECTOMY     no cervix seen on pelvic exam; 12/2016, [place]   COLONOSCOPY WITH PROPOFOL N/A 07/15/2017   Procedure: COLONOSCOPY WITH PROPOFOL;  Surgeon: EManya Silvas MD;  [facility];  Service: Endoscopy;  Laterality: N/A;   Family History  Problem Relation Age of Onset   Hypertension Mother    Lung cancer Father 675  Throat cancer Father    Hypertension Sister    Breast cancer Paternal Aunt    Heart disease Maternal Uncle    Heart disease Maternal Aunt    Social History   Socioeconomic History   Marital status: Divorced    Spouse name: Not on file   Number of children: Not on file   Years of education: Not on  file   Highest education level: Not on file  Occupational History   Not on file  Tobacco Use   Smoking status: Former    Packs/day: 1.00    Years: 39.00    Total pack years: 39.00    Types: Cigarettes    Quit date: 08/01/2003    Years since quitting: 18.6   Smokeless tobacco: Never  Substance and Sexual Activity   Alcohol use: Yes    Comment: Occasional   Drug use: Not on file   Sexual activity: Not Currently  Other Topics Concern   Not on file  Social History Narrative   Regular Exercise -  Yes   Lives in [place] w/son         Social Determinants of Health   Financial Resource Strain: Low Risk  (03/07/2022)   Overall Financial Resource Strain (CARDIA)    Difficulty of Paying Living Expenses: Not hard at all  Food Insecurity: No Food  Insecurity (03/07/2022)   Hunger Vital Sign    Worried About Running Out of Food in the Last Year: Never true    Ran Out of Food in the Last Year: Never true  Transportation Needs: No Transportation Needs (03/07/2022)   PRAPARE - Hydrologist (Medical): No    Lack of Transportation (Non-Medical): No  Physical Activity: Sufficiently Active (03/07/2022)   Exercise Vital Sign    Days of Exercise per Week: 3 days    Minutes of Exercise per Session: 60 min  Stress: No Stress Concern Present (03/07/2022)   [place]    Feeling of Stress : Not at all  Social Connections: Unknown (03/07/2022)   Social Connection and Isolation Panel [NHANES]    Frequency of Communication with Friends and Family: More than three times a week    Frequency of Social Gatherings with Friends and Family: More than three times a week    Attends Religious Services: Not on Advertising copywriter or Organizations: Not on file    Attends Archivist Meetings: Not on file    Marital Status: Not on file    Tobacco Counseling Counseling given: Not Answered  Clinical Intake: Pre-visit preparation completed: Yes        Diabetes: Yes (Followed by PCP)  How often do you need to have someone help you when you read instructions, pamphlets, or other written materials from your doctor or pharmacy?: 1 - Never   Interpreter Needed?: No    Activities of Daily Living    03/07/2022    9:14 AM  In your present state of health, do you have any difficulty performing the following activities:  Hearing? 0  Vision? 0  Difficulty concentrating or making decisions? 0  Walking or climbing stairs? 0  Dressing or bathing? 0  Doing errands, shopping? 0  Preparing Food and eating ? N  Using the Toilet? N  In the past six months, have you accidently leaked urine? N  Do you have problems with loss of bowel control? N   Managing your Medications? N  Managing your Finances? N  Housekeeping or managing your Housekeeping? N   Patient Care Team: [name], FNP as PCP - General (Family Medicine) Kate Sable, MD as PCP - Cardiology (Cardiology)  Indicate any recent Medical Services you may have received from other than [facility] providers in the past year (date may be approximate).     Assessment:  This is a routine wellness examination for [name].  Virtual Visit via Telephone Note  I connected with  [name] on 03/07/22 at  9:00 AM EDT by telephone and verified that I am speaking with the correct person using two identifiers.  Location: Patient: home Provider: office Persons participating in the virtual visit: patient/Nurse Health Advisor   I discussed the limitations of performing an evaluation and management service by telehealth. We continued and completed visit with audio only. Some vital signs may be absent or patient reported.   Hearing/Vision screen Hearing Screening - Comments:: Patient is able to hear conversational tones without difficulty. No issues reported. Vision Screening - Comments:: Followed by [facility] Wears corrective lenses They have seen their ophthalmologist.   Dietary issues and exercise activities discussed: Current Exercise Habits: Home exercise routine, Type of exercise: walking, Intensity: Mild Regular diet; monitoring salt and sugar intake Good water intake   Goals Addressed               This Visit's Progress     Patient Stated     DIET - REDUCE PORTION SIZE (pt-stated)   On track     Weight goal 170lb       Other     Increase physical activity        Add senior yoga       Depression Screen    03/07/2022    9:12 AM 08/30/2021    8:32 AM 05/24/2021    8:03 AM 03/06/2021   10:35 AM 02/11/2021    8:08 AM 03/05/2020   10:41 AM 03/03/2019   10:48 AM  PHQ 2/9 Scores  PHQ - 2 Score 0 0 0 0 0 0 0    Fall Risk    03/07/2022     9:14 AM 08/30/2021    8:32 AM 05/24/2021    8:03 AM 03/06/2021   10:42 AM 02/11/2021    8:08 AM  Fall Risk   Falls in the past year? 0 0 0 0 0  Number falls in past yr:  0 0 0 0  Injury with Fall?  0 0 0 0  Risk for fall due to :  No Fall Risks  History of fall(s)   Follow up Falls evaluation completed Falls evaluation completed Falls evaluation completed Falls evaluation completed Falls evaluation completed    [place]: Home free of loose throw rugs in walkways, pet beds, electrical cords, etc? Yes  Adequate lighting in your home to reduce risk of falls? Yes   ASSISTIVE DEVICES UTILIZED TO PREVENT FALLS: Life alert? No  Use of a cane, walker or w/c? No   TIMED UP AND GO: Was the test performed? No .   Cognitive Function: Patient is alert and oriented x3.  Enjoys reading and playing online games for brain health stimulation. Denies difficulty with memory loss.      10/30/2017   11:41 AM  MMSE - Mini Mental State Exam  Orientation to time 5  Orientation to Place 5  Registration 3  Attention/ Calculation 5  Recall 3  Language- name 2 objects 2  Language- repeat 1  Language- follow 3 step command 3  Language- read & follow direction 1  Write a sentence 1  Copy design 1  Total score 30        03/07/2022    9:15 AM 03/06/2021    1:11 PM 03/05/2020   10:50 AM 03/03/2019   10:50 AM  6CIT Screen  What Year? 0 points 0 points 0 points 0 points  What month? 0 points 0 points 0 points 0 points  What time? 0 points 0 points 0 points 0 points  Count back from 20 0 points 0 points  0 points  Months in reverse 0 points 0 points  0 points  Repeat phrase 0 points 0 points  0 points  Total Score 0 points 0 points  0 points    Immunizations Immunization History  Administered Date(s) Administered   Fluad Quad(high Dose 65+) 05/07/2019   Influenza Split 08/15/2011   Influenza, High Dose Seasonal PF 07/04/2020   PFIZER(Purple Top)SARS-COV-2  Vaccination 09/06/2019, 09/27/2019, 05/23/2020   Pneumococcal Conjugate-13 03/28/2015   Pneumococcal Polysaccharide-23 10/30/2017   Tdap 01/30/2018, 02/04/2019   Zoster Recombinat (Shingrix) 08/21/2020, 10/27/2020   Screening Tests Health Maintenance  Topic Date Due   FOOT EXAM  02/11/2022   INFLUENZA VACCINE  02/25/2022   COVID-19 Vaccine (4 - Pfizer series) 03/23/2022 (Originally 07/18/2020)   HEMOGLOBIN A1C  06/18/2022   OPHTHALMOLOGY EXAM  09/04/2022   MAMMOGRAM  09/26/2023   COLONOSCOPY (Pts 45-61yr Insurance coverage will need to be confirmed)  07/16/2027   TETANUS/TDAP  02/03/2029   Pneumonia Vaccine 72 Years old  Completed   DEXA SCAN  Completed   Hepatitis C Screening  Completed   Zoster Vaccines- Shingrix  Completed   HPV VACCINES  Aged Out   Health Maintenance Health Maintenance Due  Topic Date Due   FOOT EXAM  02/11/2022   INFLUENZA VACCINE  02/25/2022   Lung Cancer Screening: (Low Dose CT Chest recommended if Age 72-80years, 30 pack-year currently smoking OR have quit w/in 15years.) does not qualify.   Vision Screening: Recommended annual ophthalmology exams for early detection of glaucoma and other disorders of the eye.  Dental Screening: Recommended annual dental exams for proper oral hygiene  Community Resource Referral / Chronic Care Management: CRR required this visit?  No   CCM required this visit?  No      Plan:   Keep all routine maintenance appointments.   I have personally reviewed and noted the following in the patient's chart:   Medical and social history Use of alcohol, tobacco or illicit drugs  Current medications and supplements including opioid prescriptions.  Functional ability and status Nutritional status Physical activity Advanced directives List of other physicians Hospitalizations, surgeries, and ER visits in previous 12 months Vitals Screenings to include cognitive, depression, and falls Referrals and appointments  In  addition, I have reviewed and discussed with patient certain preventive protocols, quality metrics, and best practice recommendations. A written personalized care plan for preventive services as well as general preventive health recommendations were provided to patient.     OVarney Biles LPN   [id number]

## 2022-03-07 NOTE — Patient Instructions (Addendum)
  Ms. Colombo , Thank you for taking time to come for your Medicare Wellness Visit. I appreciate your ongoing commitment to your health goals. Please review the following plan we discussed and let me know if I can assist you in the future.   These are the goals we discussed:  Goals       Patient Stated     DIET - REDUCE PORTION SIZE (pt-stated)      Weight goal 170lb       Other     Increase physical activity      Add senior yoga        This is a list of the screening recommended for you and due dates:  Health Maintenance  Topic Date Due   Complete foot exam   02/11/2022   Flu Shot  02/25/2022   COVID-19 Vaccine (4 - Pfizer series) 03/23/2022*   Hemoglobin A1C  06/18/2022   Eye exam for diabetics  09/04/2022   Mammogram  09/26/2023   Colon Cancer Screening  07/16/2027   Tetanus Vaccine  02/03/2029   Pneumonia Vaccine  Completed   DEXA scan (bone density measurement)  Completed   Hepatitis C Screening: USPSTF Recommendation to screen - Ages 30-79 yo.  Completed   Zoster (Shingles) Vaccine  Completed   HPV Vaccine  Aged Out  *Topic was postponed. The date shown is not the original due date.

## 2022-04-16 ENCOUNTER — Ambulatory Visit (INDEPENDENT_AMBULATORY_CARE_PROVIDER_SITE_OTHER): Payer: Medicare HMO | Admitting: Family

## 2022-04-16 ENCOUNTER — Encounter: Payer: Self-pay | Admitting: Family

## 2022-04-16 VITALS — BP 132/78 | HR 76 | Temp 97.8°F | Ht 69.0 in | Wt 200.4 lb

## 2022-04-16 DIAGNOSIS — E119 Type 2 diabetes mellitus without complications: Secondary | ICD-10-CM | POA: Diagnosis not present

## 2022-04-16 DIAGNOSIS — I10 Essential (primary) hypertension: Secondary | ICD-10-CM | POA: Diagnosis not present

## 2022-04-16 DIAGNOSIS — E782 Mixed hyperlipidemia: Secondary | ICD-10-CM | POA: Diagnosis not present

## 2022-04-16 LAB — POCT GLYCOSYLATED HEMOGLOBIN (HGB A1C): Hemoglobin A1C: 5.9 % — AB (ref 4.0–5.6)

## 2022-04-16 MED ORDER — FENOFIBRATE 145 MG PO TABS: 145.0000 mg | ORAL_TABLET | Freq: Every day | ORAL | 3 refills | Status: DC

## 2022-04-16 NOTE — Progress Notes (Signed)
Subjective:    Patient ID: Alicia Ramos, female    DOB: [birth year], 71 y.o.   MRN: [id number]  CC: Alicia Ramos is a 71 y.o. female who presents today for follow up.   HPI: Feels well today.  No new complaints. She does request refill of fenofibrate.  She plans to follow after eating annually.   DM- compliant with metformin '1500mg'$  qd  HTN-compliant with amlodipine 7.5 mg, Coreg 6.25 mg twice daily, lisinopril 10 mg daily. She notes occasional dry cough. No sob.  It is not particular bothersome for her.  She suspect allergies as a started after working in her yard  HLD- compliant with crestor '20mg'$ , fenofibrate '145mg'$   HISTORY:  Past Medical History:  Diagnosis Date   Esophageal ulcer 2000   Required blood transfusion   HTN (hypertension)    Hyperlipidemia    Tried statin but had severe myalgia   Shingles 2001   Left chest   Skin cancer 2018   left shoulder   Vaginal delivery    x1   Past Surgical History:  Procedure Laterality Date   ABDOMINAL HYSTERECTOMY     no cervix seen on pelvic exam; 12/2016, [facility] [place]   COLONOSCOPY WITH PROPOFOL N/A 07/15/2017   Procedure: COLONOSCOPY WITH PROPOFOL;  Surgeon: Manya Silvas, MD;  Location: [facility];  Service: Endoscopy;  Laterality: N/A;   Family History  Problem Relation Age of Onset   Hypertension Mother    Lung cancer Father 56   Throat cancer Father    Hypertension Sister    Breast cancer Paternal Aunt    Heart disease Maternal Uncle    Heart disease Maternal Aunt     Allergies: Ace inhibitors, Losartan, and Statins Current Outpatient Medications on File Prior to Visit  Medication Sig Dispense Refill   amLODipine (NORVASC) 2.5 MG tablet Take 1 tablet (2.5 mg total) by mouth daily. 90 tablet 2   amLODipine (NORVASC) 5 MG tablet TAKE 1 TABLET (5 MG TOTAL) BY MOUTH DAILY. 90 tablet 1   aspirin EC 81 MG tablet Take 1 tablet (81 mg total) by mouth daily. Swallow whole.     carvedilol (COREG) 6.25 MG tablet TAKE 1  TABLET BY MOUTH 2 TIMES DAILY WITH A MEAL. 180 tablet 1   lisinopril (ZESTRIL) 10 MG tablet Take 1 tablet (10 mg total) by mouth daily. 90 tablet 3   metFORMIN (GLUCOPHAGE-XR) 500 MG 24 hr tablet Take 3 tablets (1,500 mg total) by mouth every evening. 270 tablet 1   rosuvastatin (CRESTOR) 20 MG tablet TAKE 1 TABLET BY MOUTH EVERY DAY 90 tablet 3   VITAMIN D PO Take 800 mg by mouth.     No current facility-administered medications on file prior to visit.    Social History   Tobacco Use   Smoking status: Former    Packs/day: 1.00    Years: 39.00    Total pack years: 39.00    Types: Cigarettes    Quit date: 08/01/2003    Years since quitting: 18.7   Smokeless tobacco: Never  Substance Use Topics   Alcohol use: Yes    Comment: Occasional    Review of Systems  Constitutional:  Negative for chills and fever.  Respiratory:  Positive for cough. Negative for shortness of breath.   Cardiovascular:  Negative for chest pain and palpitations.  Gastrointestinal:  Negative for nausea and vomiting.      Objective:    BP 132/78 (BP Location: Left Arm, Patient Position: Sitting,  Cuff Size: Normal)   Pulse 76   Temp 97.8 F (36.6 C) (Oral)   Ht '5\' 9"'$  (1.753 m)   Wt 200 lb 6.4 oz (90.9 kg)   SpO2 97%   BMI 29.59 kg/m  BP Readings from Last 3 Encounters:  04/16/22 132/78  12/16/21 (!) 152/84  11/15/21 (!) 144/84   Wt Readings from Last 3 Encounters:  04/16/22 200 lb 6.4 oz (90.9 kg)  03/07/22 200 lb (90.7 kg)  12/16/21 200 lb 6.4 oz (90.9 kg)    Physical Exam Vitals reviewed.  Constitutional:      Appearance: She is well-developed.  Eyes:     Conjunctiva/sclera: Conjunctivae normal.  Cardiovascular:     Rate and Rhythm: Normal rate and regular rhythm.     Pulses: Normal pulses.     Heart sounds: Normal heart sounds.  Pulmonary:     Effort: Pulmonary effort is normal.     Breath sounds: Normal breath sounds. No wheezing, rhonchi or rales.  Skin:    General: Skin is warm  and dry.  Neurological:     Mental Status: She is alert.  Psychiatric:        Speech: Speech normal.        Behavior: Behavior normal.        Thought Content: Thought content normal.        Assessment & Plan:   Problem List Items Addressed This Visit       Cardiovascular and Mediastinum   Hypertension    Chronic, stable. Systolic blood pressure slightly elevated today.  We discussed potential increase lisinopril to 15 mg daily.  We also discussed changing to ARB to see if cough persists.  She suspects cough is related to allergies at this time.  She politely declines increasing lisinopril at this time.  We will continue  amlodipine 7.5 mg, Coreg 6.25 mg twice daily, lisinopril 10 mg daily      Relevant Medications   fenofibrate (TRICOR) 145 MG tablet     Endocrine   Diabetes mellitus without complication ([place]) - Primary    Lab Results  Component Value Date   HGBA1C 5.9 (A) 04/16/2022  Excellent control.  Continue metformin 1500 mg qd      Relevant Medications   fenofibrate (TRICOR) 145 MG tablet   Other Relevant Orders   POCT HgB A1C (Completed)   Comprehensive metabolic panel   Hemoglobin A1c     Other   Hyperlipemia    Lab Results  Component Value Date   LDLCALC 52 10/14/2021  Excellent control.  Continue Crestor 20 mg, fenofibrate 145 mg.  She will follow-up with cardiology, Dr. Charlestine Night annually      Relevant Medications   fenofibrate (TRICOR) 145 MG tablet     I have changed Carmon L. Hinderer's fenofibrate. I am also having her maintain her VITAMIN D PO, aspirin EC, amLODipine, metFORMIN, amLODipine, rosuvastatin, lisinopril, and carvedilol.   Meds ordered this encounter  Medications   fenofibrate (TRICOR) 145 MG tablet    Sig: Take 1 tablet (145 mg total) by mouth daily.    Dispense:  90 tablet    Refill:  3    Order Specific Question:   Supervising Provider    Answer:   Crecencio Mc [[id number]]    Return precautions given.   Risks, benefits, and  alternatives of the medications and treatment plan prescribed today were discussed, and patient expressed understanding.   Education regarding symptom management and diagnosis given to patient on AVS.  Continue to follow with [name], FNP for routine health maintenance.   Alicia Ramos and I agreed with plan.   [name], FNP

## 2022-04-16 NOTE — Assessment & Plan Note (Signed)
Lab Results  Component Value Date   LDLCALC 52 10/14/2021   Excellent control.  Continue Crestor 20 mg, fenofibrate 145 mg.  She will follow-up with cardiology, Dr. Charlestine Night annually

## 2022-04-16 NOTE — Assessment & Plan Note (Signed)
Chronic, stable. Systolic blood pressure slightly elevated today.  We discussed potential increase lisinopril to 15 mg daily.  We also discussed changing to ARB to see if cough persists.  She suspects cough is related to allergies at this time.  She politely declines increasing lisinopril at this time.  We will continue  amlodipine 7.5 mg, Coreg 6.25 mg twice daily, lisinopril 10 mg daily

## 2022-04-16 NOTE — Assessment & Plan Note (Signed)
Lab Results  Component Value Date   HGBA1C 5.9 (A) 04/16/2022   Excellent control.  Continue metformin 1500 mg qd

## 2022-06-01 ENCOUNTER — Other Ambulatory Visit: Payer: Self-pay | Admitting: Family

## 2022-06-01 DIAGNOSIS — E119 Type 2 diabetes mellitus without complications: Secondary | ICD-10-CM

## 2022-07-03 ENCOUNTER — Other Ambulatory Visit: Payer: Self-pay | Admitting: Family

## 2022-07-03 DIAGNOSIS — I10 Essential (primary) hypertension: Secondary | ICD-10-CM

## 2022-07-19 ENCOUNTER — Other Ambulatory Visit: Payer: Self-pay | Admitting: Family

## 2022-07-19 DIAGNOSIS — I10 Essential (primary) hypertension: Secondary | ICD-10-CM

## 2022-08-13 ENCOUNTER — Other Ambulatory Visit (INDEPENDENT_AMBULATORY_CARE_PROVIDER_SITE_OTHER): Payer: Medicare HMO

## 2022-08-13 DIAGNOSIS — E119 Type 2 diabetes mellitus without complications: Secondary | ICD-10-CM | POA: Diagnosis not present

## 2022-08-13 LAB — COMPREHENSIVE METABOLIC PANEL
ALT: 21 U/L (ref 0–35)
AST: 19 U/L (ref 0–37)
Albumin: 4.5 g/dL (ref 3.5–5.2)
Alkaline Phosphatase: 47 U/L (ref 39–117)
BUN: 12 mg/dL (ref 6–23)
CO2: 28 mEq/L (ref 19–32)
Calcium: 9.3 mg/dL (ref 8.4–10.5)
Chloride: 102 mEq/L (ref 96–112)
Creatinine, Ser: 0.84 mg/dL (ref 0.40–1.20)
GFR: 69.32 mL/min (ref >60.00)
Glucose, Bld: 99 mg/dL (ref 70–99)
Potassium: 4.7 mEq/L (ref 3.5–5.1)
Sodium: 136 mEq/L (ref 135–145)
Total Bilirubin: 0.4 mg/dL (ref 0.2–1.2)
Total Protein: 7.3 g/dL (ref 6.0–8.3)

## 2022-08-13 LAB — HEMOGLOBIN A1C: Hgb A1c MFr Bld: 6.5 % (ref 4.6–6.5)

## 2022-08-14 ENCOUNTER — Telehealth: Payer: Self-pay

## 2022-08-14 NOTE — Telephone Encounter (Signed)
LVM to call back to go over results......Marland Kitchen Labs stable.  A1c at goal

## 2022-08-15 NOTE — Telephone Encounter (Signed)
NOTED

## 2022-08-15 NOTE — Telephone Encounter (Signed)
Patient returned office phone call and note was read. 

## 2022-08-18 ENCOUNTER — Telehealth: Payer: Self-pay | Admitting: Family

## 2022-08-18 ENCOUNTER — Encounter: Payer: Self-pay | Admitting: Family

## 2022-08-18 ENCOUNTER — Ambulatory Visit (INDEPENDENT_AMBULATORY_CARE_PROVIDER_SITE_OTHER): Payer: Medicare HMO | Admitting: Family

## 2022-08-18 VITALS — BP 136/82 | HR 87 | Temp 97.7°F | Ht 67.0 in | Wt 196.0 lb

## 2022-08-18 DIAGNOSIS — I10 Essential (primary) hypertension: Secondary | ICD-10-CM

## 2022-08-18 DIAGNOSIS — E119 Type 2 diabetes mellitus without complications: Secondary | ICD-10-CM

## 2022-08-18 DIAGNOSIS — Z1231 Encounter for screening mammogram for malignant neoplasm of breast: Secondary | ICD-10-CM | POA: Diagnosis not present

## 2022-08-18 DIAGNOSIS — I700 Atherosclerosis of aorta: Secondary | ICD-10-CM | POA: Diagnosis not present

## 2022-08-18 NOTE — Assessment & Plan Note (Signed)
Lab Results  Component Value Date   HGBA1C 6.5 08/13/2022   Chronic, stable.  Continue metformin '1500mg'$ / daily.

## 2022-08-18 NOTE — Assessment & Plan Note (Signed)
Chronic, stable. Continue  amlodipine 7.5 mg, Coreg 6.25 mg twice daily, lisinopril 10 mg daily qd.

## 2022-08-18 NOTE — Progress Notes (Signed)
Assessment & Plan:  Diabetes mellitus without complication [facility]) Assessment & Plan: Lab Results  Component Value Date   HGBA1C 6.5 08/13/2022   Chronic, stable.  Continue metformin '1500mg'$ / daily.   Orders: -     Hemoglobin A1c; Future -     Lipid panel; Future  Encounter for screening mammogram for malignant neoplasm of breast -     3D Screening Mammogram, Left and Right; Future  Atherosclerosis of aorta (HCC) Assessment & Plan: Chronic, stable.discussed CT calcium score to further stratify risk for coronary artery disease.  Patient may consider this test in the continue future.  Continue aspirin 81 mg, fenofibrate 145 mg, Crestor '20mg'$ .     Primary hypertension Assessment & Plan: Chronic, stable. Continue  amlodipine 7.5 mg, Coreg 6.25 mg twice daily, lisinopril 10 mg daily qd.       Return precautions given.   Risks, benefits, and alternatives of the medications and treatment plan prescribed today were discussed, and patient expressed understanding.   Education regarding symptom management and diagnosis given to patient on AVS either electronically or printed.  Return in about 3 months (around 11/17/2022).  [name], FNP  Subjective:    Patient ID: Alicia Ramos, female    DOB: 1950-12-25, 72 y.o.   MRN: [id number]  CC: Alicia Ramos is a 72 y.o. female who presents today for follow up.   HPI: She has started plant based diet and has lost weight.  She is eating wholesome foods and cooking everything that she eats.  She feels well  No new complaints  She is compliant metformin 1500 mg/day      Compliant with Crestor 20 mg daily, fenofibrate 145 mg qd Allergies: Ace inhibitors, Losartan, and Statins Current Outpatient Medications on File Prior to Visit  Medication Sig Dispense Refill   amLODipine (NORVASC) 2.5 MG tablet TAKE 1 TABLET BY MOUTH EVERY DAY 90 tablet 2   amLODipine (NORVASC) 5 MG tablet TAKE 1 TABLET (5 MG TOTAL) BY MOUTH DAILY. 90  tablet 1   aspirin EC 81 MG tablet Take 1 tablet (81 mg total) by mouth daily. Swallow whole.     carvedilol (COREG) 6.25 MG tablet TAKE 1 TABLET BY MOUTH 2 TIMES DAILY WITH A MEAL. 180 tablet 1   fenofibrate (TRICOR) 145 MG tablet Take 1 tablet (145 mg total) by mouth daily. 90 tablet 3   lisinopril (ZESTRIL) 10 MG tablet Take 1 tablet (10 mg total) by mouth daily. 90 tablet 3   metFORMIN (GLUCOPHAGE-XR) 500 MG 24 hr tablet TAKE 3 TABLETS (1,500 MG TOTAL) BY MOUTH EVERY EVENING. 270 tablet 1   rosuvastatin (CRESTOR) 20 MG tablet TAKE 1 TABLET BY MOUTH EVERY DAY 90 tablet 3   VITAMIN D PO Take 800 mg by mouth.     No current facility-administered medications on file prior to visit.    Review of Systems  Constitutional:  Negative for chills and fever.  Respiratory:  Negative for cough.   Cardiovascular:  Negative for chest pain and palpitations.  Gastrointestinal:  Negative for nausea and vomiting.      Objective:    BP 136/82   Pulse 87   Temp 97.7 F (36.5 C) (Oral)   Ht '5\' 7"'$  (1.702 m)   Wt 196 lb (88.9 kg)   SpO2 99%   BMI 30.70 kg/m  BP Readings from Last 3 Encounters:  08/18/22 136/82  04/16/22 132/78  12/16/21 (!) 152/84   Wt Readings from Last 3 Encounters:  08/18/22  196 lb (88.9 kg)  04/16/22 200 lb 6.4 oz (90.9 kg)  03/07/22 200 lb (90.7 kg)    Physical Exam Vitals reviewed.  Constitutional:      Appearance: She is well-developed.  Eyes:     Conjunctiva/sclera: Conjunctivae normal.  Cardiovascular:     Rate and Rhythm: Normal rate and regular rhythm.     Pulses: Normal pulses.     Heart sounds: Normal heart sounds.  Pulmonary:     Effort: Pulmonary effort is normal.     Breath sounds: Normal breath sounds. No wheezing, rhonchi or rales.  Skin:    General: Skin is warm and dry.  Neurological:     Mental Status: She is alert.  Psychiatric:        Speech: Speech normal.        Behavior: Behavior normal.        Thought Content: Thought content normal.

## 2022-08-18 NOTE — Telephone Encounter (Signed)
close

## 2022-08-18 NOTE — Patient Instructions (Addendum)
We placed a referral for mammogram this year. I asked that you call one the below locations and schedule this when it is convenient for you.  Please call  and schedule your 3D mammogram and /or bone density scan as we discussed.   [facility]  ( new location in 2023)  [address], [place], [place] [address]  [place], [place]    We should consider a CT calcium score ( SELF PAY option)  to further stratify your overall cardiovascular risk .   An estimate of cost is $150-200 out-of-pocket as not covered by insurance. However please call your insurance company in advance to ensure no other costs so that you do not have any unexpected bills.     In [facility], It can be performed at any of these 3 locations (outpatient imaging center Energy manager) at [address]/ [facility], [facility] or [facility])   I have placed your order to [employer] [facility] in [place] as  generally most convenient.   Phone Number to [facility] on [address] is is [phone] to get scheduled. You may ask for tech there, Colletta Maryland, she is great as well.   Please call to get scheduled and if any issues at all in doing so, please let me know.   Below an article from [facility] regarding the test.   [email or web address]/exams-[email or web address]-[email or web address]/cardiac-ct#:~:[email or web address]%[email or web address]%20CT%20calcium%20score,arteries%20can%20cause%20heart%20attacks.   Exams We Offer: Cardiac CT Calcium Score  Knowing your score could save your life. A cardiac CT calcium score, also known as a coronary calcium scan, is a quick, convenient and noninvasive way of evaluating the amount of calcified (hard) plaque in your heart vessels. The level of calcium equates to the extent of plaque build-up in your arteries. Plaque in the arteries can cause heart attacks.  The radiologist reads the images and sends your doctor a report with a calcium score.  Patients with higher scores have a greater risk for a heart attack, heart disease or stroke. Knowing your score can help your doctor decide on blood pressure and cholesterol goals that will minimize your risk as much as possible.  The [employer] of Cardiology found that Coronary artery calcification (CAC) is an excellent cardiovascular disease risk marker and can help guide the decision to use cholesterol reducing medications such as statins. A negative calcium score may reduce the need for statins in otherwise eligible patients.  The exam takes less than 10 minutes, is painless and does not require any IV or oral contrast. At [facility] locations, the out-of-pocket fee without insurance is $75. At the time of scheduling, please let us know if you want to process the exam through your insurance or self-pay at the rate of $75. Patients who want to self-pay should not submit their insurance card when checking in for the appointment.   Who should get a Cardiac CT Calcium Score: Middle age adults at intermediate risk of heart disease Family history of heart disease Borderline high cholesterol, high blood pressure or diabetes Overweight or physical inactivity Uncertain about taking daily preventive medical therapy

## 2022-08-18 NOTE — Assessment & Plan Note (Addendum)
Chronic, stable.discussed CT calcium score to further stratify risk for coronary artery disease.  Patient may consider this test in the continue future.  Continue aspirin 81 mg, fenofibrate 145 mg, Crestor '20mg'$ .

## 2022-08-28 ENCOUNTER — Other Ambulatory Visit: Payer: Self-pay | Admitting: Family

## 2022-08-28 DIAGNOSIS — E119 Type 2 diabetes mellitus without complications: Secondary | ICD-10-CM

## 2022-09-18 DIAGNOSIS — E119 Type 2 diabetes mellitus without complications: Secondary | ICD-10-CM | POA: Diagnosis not present

## 2022-09-18 DIAGNOSIS — H2513 Age-related nuclear cataract, bilateral: Secondary | ICD-10-CM | POA: Diagnosis not present

## 2022-09-18 DIAGNOSIS — D3132 Benign neoplasm of left choroid: Secondary | ICD-10-CM | POA: Diagnosis not present

## 2022-09-18 LAB — HM DIABETES EYE EXAM

## 2022-10-16 ENCOUNTER — Ambulatory Visit
Admission: RE | Admit: 2022-10-16 | Discharge: 2022-10-16 | Disposition: A | Discharge status: Home / Self Care | Payer: Medicare HMO | Source: Ambulatory Visit | Attending: Family | Admitting: Family

## 2022-10-16 DIAGNOSIS — Z1231 Encounter for screening mammogram for malignant neoplasm of breast: Secondary | ICD-10-CM | POA: Diagnosis not present

## 2022-11-13 ENCOUNTER — Other Ambulatory Visit: Payer: Self-pay | Admitting: Family

## 2022-11-13 DIAGNOSIS — I10 Essential (primary) hypertension: Secondary | ICD-10-CM

## 2022-11-21 ENCOUNTER — Other Ambulatory Visit (INDEPENDENT_AMBULATORY_CARE_PROVIDER_SITE_OTHER): Payer: Medicare HMO

## 2022-11-21 DIAGNOSIS — E119 Type 2 diabetes mellitus without complications: Secondary | ICD-10-CM

## 2022-11-21 LAB — LIPID PANEL
Cholesterol: 121 mg/dL (ref 0–200)
HDL: 45.1 mg/dL (ref >39.00)
LDL Cholesterol: 44 mg/dL (ref 0–99)
NonHDL: 75.4
Total CHOL/HDL Ratio: 3
Triglycerides: 157 mg/dL — ABNORMAL HIGH (ref 0.0–149.0)
VLDL: 31.4 mg/dL (ref 0.0–40.0)

## 2022-11-21 LAB — HEMOGLOBIN A1C: Hgb A1c MFr Bld: 6.1 % (ref 4.6–6.5)

## 2022-11-26 ENCOUNTER — Ambulatory Visit (INDEPENDENT_AMBULATORY_CARE_PROVIDER_SITE_OTHER): Payer: Medicare HMO | Admitting: Family

## 2022-11-26 ENCOUNTER — Encounter: Payer: Self-pay | Admitting: Family

## 2022-11-26 VITALS — BP 130/80 | HR 75 | Temp 98.1°F | Ht 69.0 in | Wt 188.4 lb

## 2022-11-26 DIAGNOSIS — I10 Essential (primary) hypertension: Secondary | ICD-10-CM | POA: Diagnosis not present

## 2022-11-26 DIAGNOSIS — I700 Atherosclerosis of aorta: Secondary | ICD-10-CM | POA: Diagnosis not present

## 2022-11-26 DIAGNOSIS — Z7984 Long term (current) use of oral hypoglycemic drugs: Secondary | ICD-10-CM | POA: Diagnosis not present

## 2022-11-26 DIAGNOSIS — E119 Type 2 diabetes mellitus without complications: Secondary | ICD-10-CM | POA: Diagnosis not present

## 2022-11-26 LAB — MICROALBUMIN / CREATININE URINE RATIO
Creatinine,U: 48.3 mg/dL
Microalb Creat Ratio: 1.4 mg/g (ref 0.0–30.0)
Microalb, Ur: <0.7 mg/dL (ref 0.0–1.9)

## 2022-11-26 MED ORDER — LISINOPRIL 10 MG PO TABS
10.0000 mg | ORAL_TABLET | Freq: Every day | ORAL | 3 refills | Status: DC
Start: 2022-11-26 — End: 2023-11-06

## 2022-11-26 MED ORDER — FENOFIBRATE 145 MG PO TABS
145.0000 mg | ORAL_TABLET | Freq: Every day | ORAL | 3 refills | Status: DC
Start: 2022-11-26 — End: 2023-11-06

## 2022-11-26 NOTE — Assessment & Plan Note (Addendum)
Lab Results  Component Value Date   HGBA1C 6.1 11/21/2022   Chronic, stable.  Continue metformin 1000mg / daily.

## 2022-11-26 NOTE — Progress Notes (Signed)
Assessment & Plan:  Diabetes mellitus without complication [facility]) Assessment & Plan: Lab Results  Component Value Date   HGBA1C 6.1 11/21/2022   Chronic, stable.  Continue metformin 1000mg / daily.   Orders: -     Microalbumin / creatinine urine ratio -     Fenofibrate; Take 1 tablet (145 mg total) by mouth daily.  Dispense: 90 tablet; Refill: 3  Primary hypertension -     Lisinopril; Take 1 tablet (10 mg total) by mouth daily.  Dispense: 90 tablet; Refill: 3  Atherosclerosis of aorta (HCC) Assessment & Plan: Chronic, stable.discussed improvement of LDL.  We did discuss elevation in triglycerides and unsure if this is related to her plant-based diet.  We will continue to monitor . continue aspirin 81 mg, fenofibrate 145 mg, crestor 20mg  qd.       Return precautions given.   Risks, benefits, and alternatives of the medications and treatment plan prescribed today were discussed, and patient expressed understanding.   Education regarding symptom management and diagnosis given to patient on AVS either electronically or printed.  Return in about 4 months (around 03/29/2023).  [name], FNP  Subjective:    Patient ID: Alicia Ramos, female    DOB: March 05, 1951, 72 y.o.   MRN: [id number]  CC: Alicia Ramos is a 72 y.o. female who presents today for follow up.   HPI: Feels well today.  No new complaints  She is continue to follow diet which is plant-based called "Forks over [employer]".  She does not eat eggs meat dairy or [employer].  She has lost weight.   Atherosclerosis- compliant with aspirin 81 mg, fenofibrate 145 mg, Crestor 20mg .   Allergies: Ace inhibitors, Losartan, and Statins Current Outpatient Medications on File Prior to Visit  Medication Sig Dispense Refill   amLODipine (NORVASC) 2.5 MG tablet TAKE 1 TABLET BY MOUTH EVERY DAY 90 tablet 2   amLODipine (NORVASC) 5 MG tablet TAKE 1 TABLET (5 MG TOTAL) BY MOUTH DAILY. 90 tablet 1   aspirin EC 81 MG tablet Take 1  tablet (81 mg total) by mouth daily. Swallow whole.     carvedilol (COREG) 6.25 MG tablet TAKE 1 TABLET BY MOUTH TWICE A DAY WITH FOOD 180 tablet 1   metFORMIN (GLUCOPHAGE-XR) 500 MG 24 hr tablet TAKE 2 TABLETS BY MOUTH EVERY DAY WITH BREAKFAST 180 tablet 1   rosuvastatin (CRESTOR) 20 MG tablet TAKE 1 TABLET BY MOUTH EVERY DAY 90 tablet 3   VITAMIN D PO Take 800 mg by mouth.     No current facility-administered medications on file prior to visit.    Review of Systems  Constitutional:  Negative for chills and fever.  Respiratory:  Negative for cough.   Cardiovascular:  Negative for chest pain and palpitations.  Gastrointestinal:  Negative for nausea and vomiting.      Objective:    BP 130/80   Pulse 75   Temp 98.1 F (36.7 C) (Oral)   Ht 5\' 9"  (1.753 m)   Wt 188 lb 6.4 oz (85.5 kg)   SpO2 97%   BMI 27.82 kg/m  BP Readings from Last 3 Encounters:  11/26/22 130/80  08/18/22 136/82  04/16/22 132/78   Wt Readings from Last 3 Encounters:  11/26/22 188 lb 6.4 oz (85.5 kg)  08/18/22 196 lb (88.9 kg)  04/16/22 200 lb 6.4 oz (90.9 kg)    Physical Exam Vitals reviewed.  Constitutional:      Appearance: She is well-developed.  Eyes:  Conjunctiva/sclera: Conjunctivae normal.  Cardiovascular:     Rate and Rhythm: Normal rate and regular rhythm.     Pulses: Normal pulses.     Heart sounds: Normal heart sounds.  Pulmonary:     Effort: Pulmonary effort is normal.     Breath sounds: Normal breath sounds. No wheezing, rhonchi or rales.  Skin:    General: Skin is warm and dry.  Neurological:     Mental Status: She is alert.  Psychiatric:        Speech: Speech normal.        Behavior: Behavior normal.        Thought Content: Thought content normal.

## 2022-11-26 NOTE — Assessment & Plan Note (Addendum)
Chronic, stable.discussed improvement of LDL.  We did discuss elevation in triglycerides and unsure if this is related to her plant-based diet.  We will continue to monitor . continue aspirin 81 mg, fenofibrate 145 mg, crestor 20mg  qd.

## 2022-12-21 ENCOUNTER — Other Ambulatory Visit: Payer: Self-pay | Admitting: Family

## 2022-12-21 DIAGNOSIS — I10 Essential (primary) hypertension: Secondary | ICD-10-CM

## 2023-01-05 DIAGNOSIS — D2261 Melanocytic nevi of right upper limb, including shoulder: Secondary | ICD-10-CM | POA: Diagnosis not present

## 2023-01-05 DIAGNOSIS — Z08 Encounter for follow-up examination after completed treatment for malignant neoplasm: Secondary | ICD-10-CM | POA: Diagnosis not present

## 2023-01-05 DIAGNOSIS — D2272 Melanocytic nevi of left lower limb, including hip: Secondary | ICD-10-CM | POA: Diagnosis not present

## 2023-01-05 DIAGNOSIS — Z85828 Personal history of other malignant neoplasm of skin: Secondary | ICD-10-CM | POA: Diagnosis not present

## 2023-01-05 DIAGNOSIS — D2262 Melanocytic nevi of left upper limb, including shoulder: Secondary | ICD-10-CM | POA: Diagnosis not present

## 2023-01-05 DIAGNOSIS — D225 Melanocytic nevi of trunk: Secondary | ICD-10-CM | POA: Diagnosis not present

## 2023-02-10 DIAGNOSIS — Z0100 Encounter for examination of eyes and vision without abnormal findings: Secondary | ICD-10-CM | POA: Diagnosis not present

## 2023-02-23 DIAGNOSIS — J069 Acute upper respiratory infection, unspecified: Secondary | ICD-10-CM | POA: Diagnosis not present

## 2023-02-25 ENCOUNTER — Encounter (INDEPENDENT_AMBULATORY_CARE_PROVIDER_SITE_OTHER): Payer: Self-pay

## 2023-03-12 ENCOUNTER — Encounter (INDEPENDENT_AMBULATORY_CARE_PROVIDER_SITE_OTHER): Payer: Self-pay

## 2023-03-27 ENCOUNTER — Other Ambulatory Visit: Payer: Self-pay | Admitting: Family

## 2023-03-27 DIAGNOSIS — E119 Type 2 diabetes mellitus without complications: Secondary | ICD-10-CM

## 2023-04-01 ENCOUNTER — Ambulatory Visit: Payer: Medicare HMO | Admitting: Family

## 2023-04-04 ENCOUNTER — Other Ambulatory Visit: Payer: Self-pay | Admitting: Family

## 2023-04-04 DIAGNOSIS — I10 Essential (primary) hypertension: Secondary | ICD-10-CM

## 2023-04-14 ENCOUNTER — Encounter: Payer: Self-pay | Admitting: Family

## 2023-04-14 ENCOUNTER — Ambulatory Visit (INDEPENDENT_AMBULATORY_CARE_PROVIDER_SITE_OTHER): Payer: Medicare HMO | Admitting: Family

## 2023-04-14 VITALS — BP 124/82 | HR 78 | Temp 98.2°F | Ht 69.0 in | Wt 189.4 lb

## 2023-04-14 DIAGNOSIS — Z7984 Long term (current) use of oral hypoglycemic drugs: Secondary | ICD-10-CM | POA: Diagnosis not present

## 2023-04-14 DIAGNOSIS — I10 Essential (primary) hypertension: Secondary | ICD-10-CM

## 2023-04-14 DIAGNOSIS — E119 Type 2 diabetes mellitus without complications: Secondary | ICD-10-CM

## 2023-04-14 LAB — COMPREHENSIVE METABOLIC PANEL
ALT: 13 U/L (ref 0–35)
AST: 19 U/L (ref 0–37)
Albumin: 4.5 g/dL (ref 3.5–5.2)
Alkaline Phosphatase: 47 U/L (ref 39–117)
BUN: 16 mg/dL (ref 6–23)
CO2: 26 meq/L (ref 19–32)
Calcium: 9.7 mg/dL (ref 8.4–10.5)
Chloride: 101 meq/L (ref 96–112)
Creatinine, Ser: 1 mg/dL (ref 0.40–1.20)
GFR: 55.97 mL/min — ABNORMAL LOW (ref >60.00)
Glucose, Bld: 77 mg/dL (ref 70–99)
Potassium: 4.9 meq/L (ref 3.5–5.1)
Sodium: 134 meq/L — ABNORMAL LOW (ref 135–145)
Total Bilirubin: 0.5 mg/dL (ref 0.2–1.2)
Total Protein: 7.6 g/dL (ref 6.0–8.3)

## 2023-04-14 LAB — HEMOGLOBIN A1C: Hgb A1c MFr Bld: 6 % (ref 4.6–6.5)

## 2023-04-14 MED ORDER — METFORMIN HCL ER 500 MG PO TB24: 1000.0000 mg | ORAL_TABLET | Freq: Every day | ORAL | Status: DC

## 2023-04-14 MED ORDER — AMLODIPINE BESYLATE 2.5 MG PO TABS
2.5000 mg | ORAL_TABLET | Freq: Every day | ORAL | 3 refills | Status: DC
Start: 2023-04-14 — End: 2024-04-04

## 2023-04-14 NOTE — Progress Notes (Signed)
Assessment & Plan:  Diabetes mellitus without complication (HCC) Assessment & Plan: Pending A1c.  Anticipate good control.  Continue metformin 1000 mg qd  Orders: -     metFORMIN HCl ER; Take 2 tablets (1,000 mg total) by mouth daily. -     Comprehensive metabolic panel -     Hemoglobin A1c  Primary hypertension Assessment & Plan: Chronic, stable. Continue  amlodipine 7.5 mg, Coreg 6.25 mg twice daily, lisinopril 10 mg daily qd.   Orders: -     amLODIPine Besylate; Take 1 tablet (2.5 mg total) by mouth daily.  Dispense: 90 tablet; Refill: 3     Return precautions given.   Risks, benefits, and alternatives of the medications and treatment plan prescribed today were discussed, and patient expressed understanding.   Education regarding symptom management and diagnosis given to patient on AVS either electronically or printed.  Return in about 6 months (around 10/12/2023).  [name], FNP  Subjective:    Patient ID: Alicia Ramos, female    DOB: [birth year], 72 y.o.   MRN: [id number]  CC: Alicia Ramos is a 72 y.o. female who presents today for follow up.   HPI: Feels well today.  No new complaints.  Continues to follow [employer].    Continue metformin 1000mg / daily   Allergies: Ace inhibitors, Losartan, and Statins Current Outpatient Medications on File Prior to Visit  Medication Sig Dispense Refill   amLODipine (NORVASC) 5 MG tablet TAKE 1 TABLET (5 MG TOTAL) BY MOUTH DAILY. 90 tablet 3   aspirin EC 81 MG tablet Take 1 tablet (81 mg total) by mouth daily. Swallow whole.     carvedilol (COREG) 6.25 MG tablet TAKE 1 TABLET BY MOUTH TWICE A DAY WITH FOOD 180 tablet 2   fenofibrate (TRICOR) 145 MG tablet Take 1 tablet (145 mg total) by mouth daily. 90 tablet 3   lisinopril (ZESTRIL) 10 MG tablet Take 1 tablet (10 mg total) by mouth daily. 90 tablet 3   rosuvastatin (CRESTOR) 20 MG tablet TAKE 1 TABLET BY MOUTH EVERY DAY 90 tablet 3   VITAMIN D PO Take  800 mg by mouth.     No current facility-administered medications on file prior to visit.    Review of Systems  Constitutional:  Negative for chills and fever.  Respiratory:  Negative for cough.   Cardiovascular:  Negative for chest pain and palpitations.  Gastrointestinal:  Negative for nausea and vomiting.      Objective:    BP 124/82   Pulse 78   Temp 98.2 F (36.8 C) (Oral)   Ht 5\' 9"  (1.753 m)   Wt 189 lb 6.4 oz (85.9 kg)   SpO2 96%   BMI 27.97 kg/m  BP Readings from Last 3 Encounters:  04/14/23 124/82  11/26/22 130/80  08/18/22 136/82   Wt Readings from Last 3 Encounters:  04/14/23 189 lb 6.4 oz (85.9 kg)  11/26/22 188 lb 6.4 oz (85.5 kg)  08/18/22 196 lb (88.9 kg)    Physical Exam Vitals reviewed.  Constitutional:      Appearance: She is well-developed.  Eyes:     Conjunctiva/sclera: Conjunctivae normal.  Cardiovascular:     Rate and Rhythm: Normal rate and regular rhythm.     Pulses: Normal pulses.     Heart sounds: Normal heart sounds.  Pulmonary:     Effort: Pulmonary effort is normal.     Breath sounds: Normal breath sounds. No wheezing, rhonchi or rales.  Skin:    General: Skin is warm and dry.  Neurological:     Mental Status: She is alert.  Psychiatric:        Speech: Speech normal.        Behavior: Behavior normal.        Thought Content: Thought content normal.

## 2023-04-14 NOTE — Assessment & Plan Note (Signed)
Chronic, stable. Continue  amlodipine 7.5 mg, Coreg 6.25 mg twice daily, lisinopril 10 mg daily qd.

## 2023-04-14 NOTE — Assessment & Plan Note (Signed)
Pending A1c.  Anticipate good control.  Continue metformin 1000 mg qd

## 2023-05-07 ENCOUNTER — Ambulatory Visit: Payer: Medicare HMO | Admitting: Emergency Medicine

## 2023-05-07 VITALS — Ht 69.0 in | Wt 187.0 lb

## 2023-05-07 DIAGNOSIS — Z780 Asymptomatic menopausal state: Secondary | ICD-10-CM

## 2023-05-07 DIAGNOSIS — Z0000 Encounter for general adult medical examination without abnormal findings: Secondary | ICD-10-CM | POA: Diagnosis not present

## 2023-05-07 NOTE — Patient Instructions (Addendum)
Alicia Ramos , Thank you for taking time to come for your Medicare Wellness Visit. I appreciate your ongoing commitment to your health goals. Please review the following plan we discussed and let me know if I can assist you in the future.   Referrals/Orders/Follow-Ups/Clinician Recommendations: I have placed an order for a bone density test. You can have this done with your next mammogram in March 2025. Call [facility] @ [phone] to schedule.  This is a list of the screening recommended for you and due dates:  Health Maintenance  Topic Date Due   COVID-19 Vaccine (4 - 2023-24 season) 03/29/2023   DEXA scan (bone density measurement)  04/23/2023   Flu Shot  10/26/2023*   Complete foot exam   08/19/2023   Eye exam for diabetics  09/19/2023   Hemoglobin A1C  10/12/2023   Mammogram  10/16/2023   Yearly kidney health urinalysis for diabetes  11/26/2023   Yearly kidney function blood test for diabetes  04/13/2024   Medicare Annual Wellness Visit  05/06/2024   Colon Cancer Screening  07/16/2027   DTaP/Tdap/Td vaccine (3 - Td or Tdap) 02/03/2029   Pneumonia Vaccine  Completed   Hepatitis C Screening  Completed   Zoster (Shingles) Vaccine  Completed   HPV Vaccine  Aged Out  *Topic was postponed. The date shown is not the original due date.    Advanced directives: (Copy Requested) Please bring a copy of your health care power of attorney and living will to the office to be added to your chart at your convenience.  Next Medicare Annual Wellness Visit scheduled for next year: Yes, 05/12/24 @ 9am

## 2023-05-07 NOTE — Progress Notes (Signed)
Subjective:   Alicia Ramos is a 72 y.o. female who presents for Medicare Annual (Subsequent) preventive examination.  Visit Complete: Virtual I connected with  Emilie Rutter on 05/07/23 by a audio enabled telemedicine application and verified that I am speaking with the correct person using two identifiers.  Patient Location: Home  Provider Location: Home Office  I discussed the limitations of evaluation and management by telemedicine. The patient expressed understanding and agreed to proceed.  Vital Signs: Because this visit was a virtual/telehealth visit, some criteria may be missing or patient reported. Any vitals not documented were not able to be obtained and vitals that have been documented are patient reported.   Cardiac Risk Factors include: advanced age (>55men, >62 women);diabetes mellitus;dyslipidemia;hypertension     Objective:    Today's Vitals   05/07/23 0901  Weight: 187 lb (84.8 kg)  Height: 5\' 9"  (1.753 m)   Body mass index is 27.62 kg/m.     05/07/2023    9:14 AM 03/07/2022    9:13 AM 03/06/2021   10:40 AM 03/05/2020   10:45 AM 03/03/2019   10:47 AM  Advanced Directives  Does Patient Have a Medical Advance Directive? Yes Yes No No No  Type of Estate agent of [place];Living will Healthcare Power of [place];Living will     Does patient want to make changes to medical advance directive? No - Patient declined No - Patient declined No - Patient declined    Copy of Healthcare Power of Attorney in Chart? No - copy requested No - copy requested     Would patient like information on creating a medical advance directive?    No - Patient declined No - Patient declined    Current Medications (verified) Outpatient Encounter Medications as of 05/07/2023  Medication Sig   acetaminophen (TYLENOL) 500 MG tablet Take 500 mg by mouth every 6 (six) hours as needed.   amLODipine (NORVASC) 2.5 MG tablet Take 1 tablet (2.5 mg total) by mouth daily.    amLODipine (NORVASC) 5 MG tablet TAKE 1 TABLET (5 MG TOTAL) BY MOUTH DAILY.   aspirin EC 81 MG tablet Take 1 tablet (81 mg total) by mouth daily. Swallow whole.   carvedilol (COREG) 6.25 MG tablet TAKE 1 TABLET BY MOUTH TWICE A DAY WITH FOOD   fenofibrate (TRICOR) 145 MG tablet Take 1 tablet (145 mg total) by mouth daily.   lisinopril (ZESTRIL) 10 MG tablet Take 1 tablet (10 mg total) by mouth daily.   metFORMIN (GLUCOPHAGE-XR) 500 MG 24 hr tablet Take 2 tablets (1,000 mg total) by mouth daily.   rosuvastatin (CRESTOR) 20 MG tablet TAKE 1 TABLET BY MOUTH EVERY DAY   VITAMIN D PO Take 800 mg by mouth.   No facility-administered encounter medications on file as of 05/07/2023.    Allergies (verified) Ace inhibitors, Losartan, and Statins   History: Past Medical History:  Diagnosis Date   Esophageal ulcer 2000   Required blood transfusion   HTN (hypertension)    Hyperlipidemia    Tried statin but had severe myalgia   Shingles 2001   Left chest   Skin cancer 2018   left shoulder   Vaginal delivery    x1   Past Surgical History:  Procedure Laterality Date   ABDOMINAL HYSTERECTOMY     no cervix seen on pelvic exam; 12/2016, [facility]   COLONOSCOPY WITH PROPOFOL N/A 07/15/2017   Procedure: COLONOSCOPY WITH PROPOFOL;  Surgeon: Scot Jun, MD;  Location: [facility] ENDOSCOPY;  Service: Endoscopy;  Laterality: N/A;   Family History  Problem Relation Age of Onset   Hypertension Mother    Lung cancer Father 102   Throat cancer Father    Hypertension Sister    Breast cancer Paternal Aunt    Heart disease Maternal Uncle    Heart disease Maternal Aunt    Social History   Socioeconomic History   Marital status: Divorced    Spouse name: Not on file   Number of children: 1   Years of education: Not on file   Highest education level: Not on file  Occupational History   Not on file  Tobacco Use   Smoking status: Former    Current packs/day: 0.00    Average packs/day: 1 pack/day for  39.0 years (39.0 ttl pk-yrs)    Types: Cigarettes    Start date: 07/31/1964    Quit date: 08/01/2003    Years since quitting: 19.7   Smokeless tobacco: Never  Vaping Use   Vaping status: Never Used  Substance and Sexual Activity   Alcohol use: Yes    Comment: 1 glass of wine or beer monthly or less   Drug use: Never   Sexual activity: Not Currently  Other Topics Concern   Not on file  Social History Narrative   Regular [place] -  [place]    Social Determinants of Health   Financial Resource Strain: Low Risk  (05/07/2023)   Overall Financial Resource Strain (CARDIA)    Difficulty of Paying Living Expenses: Not hard at all  Food Insecurity: No Food Insecurity (05/07/2023)   Hunger Vital Sign    Worried About Running Out of Food in the Last Year: Never true    Ran Out of Food in the Last Year: Never true  Transportation Needs: No Transportation Needs (05/07/2023)   PRAPARE - Administrator, Civil Service (Medical): No    Lack of Transportation (Non-Medical): No  Physical Activity: Insufficiently Active (05/07/2023)   Exercise Vital Sign    Days of Exercise per Week: 1 day    Minutes of Exercise per Session: 50 min  Stress: No Stress Concern Present (05/07/2023)   [employer] of Occupational Health - Occupational Stress Questionnaire    Feeling of Stress : Not at all  Social Connections: Moderately Isolated (05/07/2023)   Social Connection and Isolation Panel [NHANES]    Frequency of Communication with Friends and Family: More than three times a week    Frequency of Social Gatherings with Friends and Family: More than three times a week    Attends Religious Services: More than 4 times per year    Active Member of [employer] or Organizations: No    Attends Engineer, structural: Never    Marital Status: Divorced    Tobacco Counseling Counseling given: Not Answered   Clinical Intake:  Pre-visit preparation completed: Yes  Pain :  No/denies pain     BMI - recorded: 27.62 Nutritional Status: BMI 25 -29 Overweight Nutritional Risks: None Diabetes: Yes CBG done?: No Did pt. bring in CBG monitor from home?: No  How often do you need to have someone help you when you read instructions, pamphlets, or other written materials from your doctor or pharmacy?: 1 - Never  Interpreter Needed?: No  Information entered by :: Tora Kindred, CMA   Activities of Daily Living    05/07/2023    9:04 AM  In your present state of health, do you have any difficulty performing  the following activities:  Hearing? 0  Vision? 0  Difficulty concentrating or making decisions? 0  Walking or climbing stairs? 0  Dressing or bathing? 0  Doing errands, shopping? 0  Preparing Food and eating ? N  Using the Toilet? N  In the past six months, have you accidently leaked urine? N  Do you have problems with loss of bowel control? N  Managing your Medications? N  Managing your Finances? N  Housekeeping or managing your Housekeeping? N    Patient Care Team: Allegra Grana, FNP as PCP - General (Family Medicine) Debbe Odea, MD as PCP - Cardiology (Cardiology)  Indicate any recent Medical Services you may have received from other than [facility] providers in the past year (date may be approximate).     Assessment:   This is a routine wellness examination for [place].  Hearing/Vision screen Hearing Screening - Comments:: Denies hearing loss  Vision Screening - Comments:: Gets routine eye exams   Goals Addressed               This Visit's Progress     Weight (lb) < 170 lb (77.1 kg) (pt-stated)   187 lb (84.8 kg)     Depression Screen    05/07/2023    9:12 AM 04/14/2023   11:15 AM 11/26/2022    8:08 AM 04/16/2022    8:11 AM 03/07/2022    9:12 AM 08/30/2021    8:32 AM 05/24/2021    8:03 AM  PHQ 2/9 Scores  PHQ - 2 Score 0 0 0 0 0 0 0  PHQ- 9 Score 0          Fall Risk    05/07/2023    9:15 AM 04/14/2023   11:15 AM  11/26/2022    8:08 AM 08/18/2022    8:21 AM 04/16/2022    8:11 AM  Fall Risk   Falls in the past year? 0 0 0 0 0  Number falls in past yr: 0 0 0 0 0  Injury with Fall? 0 0 0 0 0  Risk for fall due to : No Fall Risks No Fall Risks No Fall Risks No Fall Risks No Fall Risks  Follow up Falls prevention discussed Falls evaluation completed Falls evaluation completed Falls evaluation completed Falls evaluation completed    MEDICARE RISK AT HOME: Medicare Risk at Home Any stairs in or around the home?: Yes If so, are there any without handrails?: No Home free of loose throw rugs in walkways, pet beds, electrical cords, etc?: Yes Adequate lighting in your home to reduce risk of falls?: Yes Life alert?: No Use of a cane, walker or w/c?: No Grab bars in the bathroom?: No Shower chair or bench in shower?: No Elevated toilet seat or a handicapped toilet?: No  TIMED UP AND GO:  Was the test performed?  No    Cognitive Function:    10/30/2017   11:41 AM  MMSE - Mini Mental State Exam  Orientation to time 5  Orientation to Place 5  Registration 3  Attention/ Calculation 5  Recall 3  Language- name 2 objects 2  Language- repeat 1  Language- follow 3 step command 3  Language- read & follow direction 1  Write a sentence 1  Copy design 1  Total score 30        05/07/2023    9:19 AM 03/07/2022    9:15 AM 03/06/2021    1:11 PM 03/05/2020   10:50 AM 03/03/2019  10:50 AM  6CIT Screen  What Year? 0 points 0 points 0 points 0 points 0 points  What month? 0 points 0 points 0 points 0 points 0 points  What time? 0 points 0 points 0 points 0 points 0 points  Count back from 20 0 points 0 points 0 points  0 points  Months in reverse 0 points 0 points 0 points  0 points  Repeat phrase 0 points 0 points 0 points  0 points  Total Score 0 points 0 points 0 points  0 points    Immunizations Immunization History  Administered Date(s) Administered   Fluad Quad(high Dose 65+) 05/07/2019    Influenza Split 08/15/2011   Influenza, High Dose Seasonal PF 07/04/2020   PFIZER(Purple Top)SARS-COV-2 Vaccination 09/06/2019, 09/27/2019, 05/23/2020   Pneumococcal Conjugate-13 03/28/2015   Pneumococcal Polysaccharide-23 10/30/2017   Tdap 01/30/2018, 02/04/2019   Zoster Recombinant(Shingrix) 08/21/2020, 10/27/2020    TDAP status: Up to date  Flu Vaccine status: Declined, Education has been provided regarding the importance of this vaccine but patient still declined. Advised may receive this vaccine at local pharmacy or Health Dept. Aware to provide a copy of the vaccination record if obtained from local pharmacy or Health Dept. Verbalized acceptance and understanding.  Pneumococcal vaccine status: Up to date  Covid-19 vaccine status: Declined, Education has been provided regarding the importance of this vaccine but patient still declined. Advised may receive this vaccine at local pharmacy or Health Dept.or vaccine clinic. Aware to provide a copy of the vaccination record if obtained from local pharmacy or Health Dept. Verbalized acceptance and understanding.  Qualifies for Shingles Vaccine? Yes   Zostavax completed No   Shingrix Completed?: Yes  Screening Tests Health Maintenance  Topic Date Due   COVID-19 Vaccine (4 - 2023-24 season) 03/29/2023   INFLUENZA VACCINE  10/26/2023 (Originally 02/26/2023)   FOOT EXAM  08/19/2023   OPHTHALMOLOGY EXAM  09/19/2023   HEMOGLOBIN A1C  10/12/2023   Diabetic kidney evaluation - Urine ACR  11/26/2023   Diabetic kidney evaluation - eGFR measurement  04/13/2024   Medicare Annual Wellness (AWV)  05/06/2024   MAMMOGRAM  10/15/2024   Colonoscopy  07/16/2027   DTaP/Tdap/Td (3 - Td or Tdap) 02/03/2029   Pneumonia Vaccine 70+ Years old  Completed   DEXA SCAN  Completed   Hepatitis C Screening  Completed   Zoster Vaccines- Shingrix  Completed   HPV VACCINES  Aged Out    Health Maintenance  Health Maintenance Due  Topic Date Due   COVID-19  Vaccine (4 - 2023-24 season) 03/29/2023    Colorectal cancer screening: Type of screening: Colonoscopy. Completed 07/05/17. Repeat every 10 years  Mammogram status: Completed 10/16/22. Repeat every year  Bone Density status: Completed 04/22/21. Results reflect: Bone density results: OSTEOPENIA. Repeat every 2 years.  Lung Cancer Screening: (Low Dose CT Chest recommended if Age 65-80 years, 20 pack-year currently smoking OR have quit w/in 15years.) does not qualify.   Lung Cancer Screening Referral: n/a  Additional Screening:  Hepatitis C Screening: does not qualify; Completed 09/27/15  Vision Screening: Recommended annual ophthalmology exams for early detection of glaucoma and other disorders of the eye. Is the patient up to date with their annual eye exam?  Yes  Who is the provider or what is the name of the office in which the patient attends annual eye exams? [facility] If pt is not established with a provider, would they like to be referred to a provider to establish care? No .  Dental Screening: Recommended annual dental exams for proper oral hygiene  Diabetic Foot Exam: Diabetic Foot Exam: Completed 08/18/22  Community Resource Referral / Chronic Care Management: CRR required this visit?  No   CCM required this visit?  No     Plan:     I have personally reviewed and noted the following in the patient's chart:   Medical and social history Use of alcohol, tobacco or illicit drugs  Current medications and supplements including opioid prescriptions. Patient is not currently taking opioid prescriptions. Functional ability and status Nutritional status Physical activity Advanced directives List of other physicians Hospitalizations, surgeries, and ER visits in previous 12 months Vitals Screenings to include cognitive, depression, and falls Referrals and appointments  In addition, I have reviewed and discussed with patient certain preventive protocols, quality metrics,  and best practice recommendations. A written personalized care plan for preventive services as well as general preventive health recommendations were provided to patient.     Tora Kindred, CMA   05/07/2023   After Visit Summary: (MyChart) Due to this being a telephonic visit, the after visit summary with patients personalized plan was offered to patient via MyChart   Nurse Notes:  Declined covid and flu vaccines. Placed order for DEXA scan. Patient will schedule for March 2025 when she gets her next mammogram.

## 2023-05-27 DIAGNOSIS — Z20822 Contact with and (suspected) exposure to covid-19: Secondary | ICD-10-CM | POA: Diagnosis not present

## 2023-05-27 DIAGNOSIS — U071 COVID-19: Secondary | ICD-10-CM | POA: Diagnosis not present

## 2023-05-27 DIAGNOSIS — R070 Pain in throat: Secondary | ICD-10-CM | POA: Diagnosis not present

## 2023-08-10 ENCOUNTER — Other Ambulatory Visit: Payer: Self-pay | Admitting: Family

## 2023-08-10 DIAGNOSIS — E119 Type 2 diabetes mellitus without complications: Secondary | ICD-10-CM

## 2023-08-26 ENCOUNTER — Other Ambulatory Visit: Payer: Self-pay | Admitting: Family

## 2023-08-26 DIAGNOSIS — Z1231 Encounter for screening mammogram for malignant neoplasm of breast: Secondary | ICD-10-CM

## 2023-09-25 DIAGNOSIS — D3132 Benign neoplasm of left choroid: Secondary | ICD-10-CM | POA: Diagnosis not present

## 2023-09-25 DIAGNOSIS — E119 Type 2 diabetes mellitus without complications: Secondary | ICD-10-CM | POA: Diagnosis not present

## 2023-09-25 DIAGNOSIS — H2513 Age-related nuclear cataract, bilateral: Secondary | ICD-10-CM | POA: Diagnosis not present

## 2023-09-25 LAB — HM DIABETES EYE EXAM

## 2023-10-12 ENCOUNTER — Encounter: Payer: Self-pay | Admitting: Family

## 2023-10-12 ENCOUNTER — Ambulatory Visit (INDEPENDENT_AMBULATORY_CARE_PROVIDER_SITE_OTHER): Payer: Medicare HMO | Admitting: Family

## 2023-10-12 VITALS — BP 128/84 | HR 92 | Temp 98.6°F | Ht 68.0 in | Wt 195.0 lb

## 2023-10-12 DIAGNOSIS — E119 Type 2 diabetes mellitus without complications: Secondary | ICD-10-CM | POA: Diagnosis not present

## 2023-10-12 DIAGNOSIS — I700 Atherosclerosis of aorta: Secondary | ICD-10-CM | POA: Diagnosis not present

## 2023-10-12 DIAGNOSIS — M8580 Other specified disorders of bone density and structure, unspecified site: Secondary | ICD-10-CM

## 2023-10-12 DIAGNOSIS — I10 Essential (primary) hypertension: Secondary | ICD-10-CM

## 2023-10-12 DIAGNOSIS — R7309 Other abnormal glucose: Secondary | ICD-10-CM

## 2023-10-12 DIAGNOSIS — Z7984 Long term (current) use of oral hypoglycemic drugs: Secondary | ICD-10-CM | POA: Diagnosis not present

## 2023-10-12 LAB — COMPREHENSIVE METABOLIC PANEL
ALT: 18 U/L (ref 0–35)
AST: 20 U/L (ref 0–37)
Albumin: 5 g/dL (ref 3.5–5.2)
Alkaline Phosphatase: 41 U/L (ref 39–117)
BUN: 16 mg/dL (ref 6–23)
CO2: 28 meq/L (ref 19–32)
Calcium: 9.9 mg/dL (ref 8.4–10.5)
Chloride: 99 meq/L (ref 96–112)
Creatinine, Ser: 0.9 mg/dL (ref 0.40–1.20)
GFR: 63.29 mL/min (ref >60.00)
Glucose, Bld: 119 mg/dL — ABNORMAL HIGH (ref 70–99)
Potassium: 5.4 meq/L — ABNORMAL HIGH (ref 3.5–5.1)
Sodium: 134 meq/L — ABNORMAL LOW (ref 135–145)
Total Bilirubin: 0.4 mg/dL (ref 0.2–1.2)
Total Protein: 7.7 g/dL (ref 6.0–8.3)

## 2023-10-12 LAB — POCT GLYCOSYLATED HEMOGLOBIN (HGB A1C): Hemoglobin A1C: 6.3 % — AB (ref 4.0–5.6)

## 2023-10-12 LAB — CBC WITH DIFFERENTIAL/PLATELET
Basophils Absolute: 0 10*3/uL (ref 0.0–0.1)
Basophils Relative: 0.6 % (ref 0.0–3.0)
Eosinophils Absolute: 0.1 10*3/uL (ref 0.0–0.7)
Eosinophils Relative: 1.1 % (ref 0.0–5.0)
HCT: 38.5 % (ref 36.0–46.0)
Hemoglobin: 12.9 g/dL (ref 12.0–15.0)
Lymphocytes Relative: 27.3 % (ref 12.0–46.0)
Lymphs Abs: 1.3 10*3/uL (ref 0.7–4.0)
MCHC: 33.4 g/dL (ref 30.0–36.0)
MCV: 92.1 fl (ref 78.0–100.0)
Monocytes Absolute: 0.4 10*3/uL (ref 0.1–1.0)
Monocytes Relative: 8.3 % (ref 3.0–12.0)
Neutro Abs: 2.9 10*3/uL (ref 1.4–7.7)
Neutrophils Relative %: 62.7 % (ref 43.0–77.0)
Platelets: 317 10*3/uL (ref 150.0–400.0)
RBC: 4.17 Mil/uL (ref 3.87–5.11)
RDW: 13.8 % (ref 11.5–15.5)
WBC: 4.6 10*3/uL (ref 4.0–10.5)

## 2023-10-12 LAB — LIPID PANEL
Cholesterol: 126 mg/dL (ref 0–200)
HDL: 53.3 mg/dL (ref >39.00)
LDL Cholesterol: 48 mg/dL (ref 0–99)
NonHDL: 72.49
Total CHOL/HDL Ratio: 2
Triglycerides: 121 mg/dL (ref 0.0–149.0)
VLDL: 24.2 mg/dL (ref 0.0–40.0)

## 2023-10-12 LAB — MICROALBUMIN / CREATININE URINE RATIO
Creatinine,U: 88.7 mg/dL
Microalb Creat Ratio: 8.7 mg/g (ref 0.0–30.0)
Microalb, Ur: 0.8 mg/dL (ref 0.0–1.9)

## 2023-10-12 LAB — VITAMIN D 25 HYDROXY (VIT D DEFICIENCY, FRACTURES): VITD: 28.7 ng/mL — ABNORMAL LOW (ref 30.00–100.00)

## 2023-10-12 LAB — TSH: TSH: 6.04 u[IU]/mL — ABNORMAL HIGH (ref 0.35–5.50)

## 2023-10-12 NOTE — Assessment & Plan Note (Signed)
 Chronic, stable. Continue metformin 1000mg  every day.

## 2023-10-12 NOTE — Assessment & Plan Note (Signed)
 Chronic, stable. Pending lipid panel.   continue aspirin 81 mg, fenofibrate 145 mg, crestor 20mg  qd.

## 2023-10-12 NOTE — Progress Notes (Signed)
 Assessment & Plan:  Diabetes mellitus without complication (HCC) Assessment & Plan: Chronic, stable. Continue metformin 1000mg  every day.   Orders: -     Ambulatory referral to Podiatry -     CBC with Differential/Platelet -     Microalbumin / creatinine urine ratio  Elevated glucose -     POCT glycosylated hemoglobin (Hb A1C)  Primary hypertension Assessment & Plan: Chronic, stable. Continue  amlodipine 7.5 mg, Coreg 6.25 mg twice daily, lisinopril 10 mg daily qd.   Orders: -     TSH -     CBC with Differential/Platelet -     Comprehensive metabolic panel -     Lipid panel  Atherosclerosis of aorta (HCC) Assessment & Plan: Chronic, stable. Pending lipid panel.   continue aspirin 81 mg, fenofibrate 145 mg, crestor 20mg  qd.   Orders: -     Lipid panel  Osteopenia, unspecified location -     VITAMIN D 25 Hydroxy (Vit-D Deficiency, Fractures)     Return precautions given.   Risks, benefits, and alternatives of the medications and treatment plan prescribed today were discussed, and patient expressed understanding.   Education regarding symptom management and diagnosis given to patient on AVS either electronically or printed.  No follow-ups on file.  [name], FNP  Subjective:    Patient ID: Alicia Ramos, female    DOB: [birth year], 73 y.o.   MRN: [id number]  CC: Alicia Ramos is a 73 y.o. female who presents today for follow up.   HPI: Feels well today.  No new complaints.    Plans to start walking with [employer] rec on trails. She has form for me to sign  She does the 'strolling' hikes with [employer].    Compliant metformin 1000mg  every day.   Denies CP, SOB, leg swelling, calf pain with walking, numbness in feet.      Allergies: Ace inhibitors, Losartan, and Statins Current Outpatient Medications on File Prior to Visit  Medication Sig Dispense Refill   acetaminophen (TYLENOL) 500 MG tablet Take 500 mg by mouth every 6 (six)  hours as needed.     amLODipine (NORVASC) 2.5 MG tablet Take 1 tablet (2.5 mg total) by mouth daily. 90 tablet 3   amLODipine (NORVASC) 5 MG tablet TAKE 1 TABLET (5 MG TOTAL) BY MOUTH DAILY. 90 tablet 3   aspirin EC 81 MG tablet Take 1 tablet (81 mg total) by mouth daily. Swallow whole.     carvedilol (COREG) 6.25 MG tablet TAKE 1 TABLET BY MOUTH TWICE A DAY WITH FOOD 180 tablet 2   fenofibrate (TRICOR) 145 MG tablet Take 1 tablet (145 mg total) by mouth daily. 90 tablet 3   lisinopril (ZESTRIL) 10 MG tablet Take 1 tablet (10 mg total) by mouth daily. 90 tablet 3   metFORMIN (GLUCOPHAGE-XR) 500 MG 24 hr tablet TAKE 3 TABLETS (1,500 MG TOTAL) BY MOUTH EVERY EVENING. 270 tablet 1   rosuvastatin (CRESTOR) 20 MG tablet TAKE 1 TABLET BY MOUTH EVERY DAY 90 tablet 3   VITAMIN D PO Take 800 mg by mouth.     No current facility-administered medications on file prior to visit.    Review of Systems  Constitutional:  Negative for chills and fever.  Respiratory:  Negative for cough and shortness of breath.   Cardiovascular:  Negative for chest pain, palpitations and leg swelling.  Gastrointestinal:  Negative for nausea and vomiting.  Neurological:  Negative for numbness.  Objective:    BP 128/84   Pulse 92   Temp 98.6 F (37 C) (Oral)   Ht 5\' 8"  (1.727 m)   Wt 195 lb (88.5 kg)   SpO2 97%   BMI 29.65 kg/m  BP Readings from Last 3 Encounters:  10/12/23 128/84  04/14/23 124/82  11/26/22 130/80   Wt Readings from Last 3 Encounters:  10/12/23 195 lb (88.5 kg)  05/07/23 187 lb (84.8 kg)  04/14/23 189 lb 6.4 oz (85.9 kg)    Physical Exam Vitals reviewed.  Constitutional:      Appearance: She is well-developed.  Eyes:     Conjunctiva/sclera: Conjunctivae normal.  Cardiovascular:     Rate and Rhythm: Normal rate and regular rhythm.     Pulses: Normal pulses.     Heart sounds: Normal heart sounds.  Pulmonary:     Effort: Pulmonary effort is normal.     Breath sounds: Normal  breath sounds. No wheezing, rhonchi or rales.  Skin:    General: Skin is warm and dry.  Neurological:     Mental Status: She is alert.  Psychiatric:        Speech: Speech normal.        Behavior: Behavior normal.        Thought Content: Thought content normal.

## 2023-10-12 NOTE — Assessment & Plan Note (Signed)
Chronic, stable. Continue  amlodipine 7.5 mg, Coreg 6.25 mg twice daily, lisinopril 10 mg daily qd.

## 2023-10-14 ENCOUNTER — Encounter: Payer: Self-pay | Admitting: Family

## 2023-10-14 ENCOUNTER — Other Ambulatory Visit: Payer: Self-pay | Admitting: Family

## 2023-10-14 DIAGNOSIS — R899 Unspecified abnormal finding in specimens from other organs, systems and tissues: Secondary | ICD-10-CM

## 2023-10-20 ENCOUNTER — Ambulatory Visit
Admission: RE | Admit: 2023-10-20 | Discharge: 2023-10-20 | Disposition: A | Discharge status: Home / Self Care | Payer: Medicare HMO | Source: Ambulatory Visit | Attending: Family | Admitting: Family

## 2023-10-20 DIAGNOSIS — Z780 Asymptomatic menopausal state: Secondary | ICD-10-CM | POA: Insufficient documentation

## 2023-10-20 DIAGNOSIS — M85832 Other specified disorders of bone density and structure, left forearm: Secondary | ICD-10-CM | POA: Diagnosis not present

## 2023-10-20 DIAGNOSIS — Z1231 Encounter for screening mammogram for malignant neoplasm of breast: Secondary | ICD-10-CM | POA: Diagnosis not present

## 2023-10-21 ENCOUNTER — Other Ambulatory Visit: Payer: Self-pay | Admitting: Family

## 2023-10-21 ENCOUNTER — Encounter: Payer: Self-pay | Admitting: Family

## 2023-10-21 DIAGNOSIS — R928 Other abnormal and inconclusive findings on diagnostic imaging of breast: Secondary | ICD-10-CM

## 2023-10-22 ENCOUNTER — Encounter: Payer: Self-pay | Admitting: Family

## 2023-10-23 ENCOUNTER — Ambulatory Visit
Admission: RE | Admit: 2023-10-23 | Discharge: 2023-10-23 | Disposition: A | Discharge status: Home / Self Care | Source: Ambulatory Visit | Attending: Family | Admitting: Family

## 2023-10-23 DIAGNOSIS — R928 Other abnormal and inconclusive findings on diagnostic imaging of breast: Secondary | ICD-10-CM | POA: Insufficient documentation

## 2023-10-23 DIAGNOSIS — R92322 Mammographic fibroglandular density, left breast: Secondary | ICD-10-CM | POA: Diagnosis not present

## 2023-10-26 ENCOUNTER — Encounter: Payer: Self-pay | Admitting: Family

## 2023-11-06 ENCOUNTER — Other Ambulatory Visit: Payer: Self-pay | Admitting: Family

## 2023-11-06 DIAGNOSIS — I10 Essential (primary) hypertension: Secondary | ICD-10-CM

## 2023-11-06 DIAGNOSIS — E119 Type 2 diabetes mellitus without complications: Secondary | ICD-10-CM

## 2023-11-11 ENCOUNTER — Other Ambulatory Visit

## 2023-11-11 DIAGNOSIS — R899 Unspecified abnormal finding in specimens from other organs, systems and tissues: Secondary | ICD-10-CM | POA: Diagnosis not present

## 2023-11-11 LAB — BASIC METABOLIC PANEL WITH GFR
BUN: 16 mg/dL (ref 6–23)
CO2: 26 meq/L (ref 19–32)
Calcium: 9.7 mg/dL (ref 8.4–10.5)
Chloride: 100 meq/L (ref 96–112)
Creatinine, Ser: 0.96 mg/dL (ref 0.40–1.20)
GFR: 58.54 mL/min — ABNORMAL LOW (ref >60.00)
Glucose, Bld: 117 mg/dL — ABNORMAL HIGH (ref 70–99)
Potassium: 4.1 meq/L (ref 3.5–5.1)
Sodium: 136 meq/L (ref 135–145)

## 2023-11-11 LAB — T3, FREE: T3, Free: 3.4 pg/mL (ref 2.3–4.2)

## 2023-11-11 LAB — T4, FREE: Free T4: 0.72 ng/dL (ref 0.60–1.60)

## 2023-11-11 LAB — TSH: TSH: 5.65 u[IU]/mL — ABNORMAL HIGH (ref 0.35–5.50)

## 2023-11-21 ENCOUNTER — Encounter: Payer: Self-pay | Admitting: Family

## 2023-12-07 ENCOUNTER — Ambulatory Visit: Admitting: Family

## 2023-12-09 ENCOUNTER — Ambulatory Visit (INDEPENDENT_AMBULATORY_CARE_PROVIDER_SITE_OTHER): Admitting: Family

## 2023-12-09 ENCOUNTER — Encounter: Payer: Self-pay | Admitting: Family

## 2023-12-09 VITALS — BP 128/76 | HR 97 | Temp 98.7°F | Ht 68.0 in | Wt 193.8 lb

## 2023-12-09 DIAGNOSIS — E038 Other specified hypothyroidism: Secondary | ICD-10-CM

## 2023-12-09 DIAGNOSIS — I700 Atherosclerosis of aorta: Secondary | ICD-10-CM

## 2023-12-09 DIAGNOSIS — M8580 Other specified disorders of bone density and structure, unspecified site: Secondary | ICD-10-CM | POA: Diagnosis not present

## 2023-12-09 NOTE — Progress Notes (Unsigned)
   Assessment & Plan:  There are no diagnoses linked to this encounter.   Return precautions given.   Risks, benefits, and alternatives of the medications and treatment plan prescribed today were discussed, and patient expressed understanding.   Education regarding symptom management and diagnosis given to patient on AVS either electronically or printed.  No follow-ups on file.  Bascom Bossier, FNP  Subjective:    Patient ID: Alicia Ramos, female    DOB: 06-Jun-1951, 73 y.o.   MRN: [id number]  CC: Alicia Ramos is a 73 y.o. female who presents today for follow up.   HPI: She is not on biotin   Subclinical hypothyroidism TSH 5.65 fT3 3.4 fT4 0.72  Hiking once a week through a senior program    Allergies: Ace inhibitors, Losartan , and Statins Current Outpatient Medications on File Prior to Visit  Medication Sig Dispense Refill   acetaminophen (TYLENOL) 500 MG tablet Take 500 mg by mouth every 6 (six) hours as needed.     amLODipine  (NORVASC ) 2.5 MG tablet Take 1 tablet (2.5 mg total) by mouth daily. 90 tablet 3   amLODipine  (NORVASC ) 5 MG tablet TAKE 1 TABLET (5 MG TOTAL) BY MOUTH DAILY. 90 tablet 3   aspirin  EC 81 MG tablet Take 1 tablet (81 mg total) by mouth daily. Swallow whole.     carvedilol  (COREG ) 6.25 MG tablet TAKE 1 TABLET BY MOUTH TWICE A DAY WITH FOOD 180 tablet 2   fenofibrate  (TRICOR ) 145 MG tablet TAKE 1 TABLET BY MOUTH EVERY DAY 90 tablet 3   lisinopril  (ZESTRIL ) 10 MG tablet TAKE 1 TABLET BY MOUTH EVERY DAY 90 tablet 3   metFORMIN  (GLUCOPHAGE -XR) 500 MG 24 hr tablet TAKE 3 TABLETS (1,500 MG TOTAL) BY MOUTH EVERY EVENING. 270 tablet 1   rosuvastatin  (CRESTOR ) 20 MG tablet TAKE 1 TABLET BY MOUTH EVERY DAY 90 tablet 3   VITAMIN D  PO Take 800 mg by mouth.     No current facility-administered medications on file prior to visit.    Review of Systems    Objective:    BP 128/76   Pulse 97   Temp 98.7 F (37.1 C) (Oral)   Ht 5\' 8"  (1.727 m)   Wt 193  lb 12.8 oz (87.9 kg)   SpO2 98%   BMI 29.47 kg/m  BP Readings from Last 3 Encounters:  12/09/23 128/76  10/12/23 128/84  04/14/23 124/82   Wt Readings from Last 3 Encounters:  12/09/23 193 lb 12.8 oz (87.9 kg)  10/12/23 195 lb (88.5 kg)  05/07/23 187 lb (84.8 kg)    Physical Exam

## 2023-12-10 DIAGNOSIS — E038 Other specified hypothyroidism: Secondary | ICD-10-CM | POA: Insufficient documentation

## 2023-12-10 NOTE — Assessment & Plan Note (Addendum)
 Asymptomatic . reviewed recent thyroid  studies which reveal subclinical hypothyroidism.  Advised we will continue to monitor plan and repeat thyroid  studies at follow-up in September 2025

## 2023-12-10 NOTE — Assessment & Plan Note (Signed)
 History of vitamin D  deficiency.  Encouraged consistent use of vitamin D .  Will periodically monitor

## 2023-12-10 NOTE — Assessment & Plan Note (Signed)
 Chronic, symptomatically stable.  Acceptable LDL.  Continue Crestor  40 mg daily

## 2024-01-05 DIAGNOSIS — L570 Actinic keratosis: Secondary | ICD-10-CM | POA: Diagnosis not present

## 2024-01-05 DIAGNOSIS — D2272 Melanocytic nevi of left lower limb, including hip: Secondary | ICD-10-CM | POA: Diagnosis not present

## 2024-01-05 DIAGNOSIS — D225 Melanocytic nevi of trunk: Secondary | ICD-10-CM | POA: Diagnosis not present

## 2024-01-05 DIAGNOSIS — L821 Other seborrheic keratosis: Secondary | ICD-10-CM | POA: Diagnosis not present

## 2024-01-05 DIAGNOSIS — D2262 Melanocytic nevi of left upper limb, including shoulder: Secondary | ICD-10-CM | POA: Diagnosis not present

## 2024-01-05 DIAGNOSIS — D2261 Melanocytic nevi of right upper limb, including shoulder: Secondary | ICD-10-CM | POA: Diagnosis not present

## 2024-01-05 DIAGNOSIS — D2271 Melanocytic nevi of right lower limb, including hip: Secondary | ICD-10-CM | POA: Diagnosis not present

## 2024-02-02 ENCOUNTER — Other Ambulatory Visit: Payer: Self-pay | Admitting: Family

## 2024-02-02 DIAGNOSIS — E119 Type 2 diabetes mellitus without complications: Secondary | ICD-10-CM

## 2024-03-25 DIAGNOSIS — H2513 Age-related nuclear cataract, bilateral: Secondary | ICD-10-CM | POA: Diagnosis not present

## 2024-03-25 DIAGNOSIS — D3132 Benign neoplasm of left choroid: Secondary | ICD-10-CM | POA: Diagnosis not present

## 2024-03-25 DIAGNOSIS — E119 Type 2 diabetes mellitus without complications: Secondary | ICD-10-CM | POA: Diagnosis not present

## 2024-03-31 DIAGNOSIS — H2513 Age-related nuclear cataract, bilateral: Secondary | ICD-10-CM | POA: Diagnosis not present

## 2024-03-31 DIAGNOSIS — H2512 Age-related nuclear cataract, left eye: Secondary | ICD-10-CM | POA: Diagnosis not present

## 2024-04-02 ENCOUNTER — Other Ambulatory Visit: Payer: Self-pay | Admitting: Family

## 2024-04-02 DIAGNOSIS — I10 Essential (primary) hypertension: Secondary | ICD-10-CM

## 2024-04-06 ENCOUNTER — Encounter: Payer: Self-pay | Admitting: Ophthalmology

## 2024-04-06 NOTE — Anesthesia Preprocedure Evaluation (Addendum)
 Anesthesia Evaluation  Patient identified by MRN, date of birth, ID band Patient awake    Reviewed: Allergy & Precautions, H&P , NPO status , Patient's Chart, lab work & pertinent test results  History of Anesthesia Complications (+) PONV and history of anesthetic complications  Airway Mallampati: I  TM Distance: >3 FB Neck ROM: Full    Dental no notable dental hx. (+) Upper Dentures, Implants Lower jaw implants front and lateral incisors, upper dentures:   Pulmonary former smoker   Pulmonary exam normal breath sounds clear to auscultation       Cardiovascular hypertension, + CAD  Normal cardiovascular exam Rhythm:Regular Rate:Normal  03-15-21 echo  1. Left ventricular ejection fraction, by estimation, is 60 to 65%. The  left ventricle has normal function. The left ventricle has no regional  wall motion abnormalities. Left ventricular diastolic parameters are  consistent with Grade I diastolic  dysfunction (impaired relaxation).   2. Right ventricular systolic function is normal. The right ventricular  size is normal. There is mildly elevated pulmonary artery systolic  pressure.   3. Left atrial size was mildly dilated.      Neuro/Psych negative neurological ROS  negative psych ROS   GI/Hepatic negative GI ROS, Neg liver ROS, PUD,,,  Endo/Other  diabetesHypothyroidism    Renal/GU negative Renal ROS  negative genitourinary   Musculoskeletal negative musculoskeletal ROS (+) Arthritis ,    Abdominal   Peds negative pediatric ROS (+)  Hematology negative hematology ROS (+)   Anesthesia Other Findings HTN (hypertension)  Hyperlipidemia Esophageal ulcer  Shingles Vaginal delivery  Skin cancer Complication of anesthesia  PONV (postoperative nausea and vomiting) Diabetes mellitus without complication (HCC) Arthritis Hypothyroidism  Atherosclerosis of aorta (HCC) Coronary artery disease involving native  coronary artery of native heart without angina pectoris  Grade I diastolic dysfunction    Reproductive/Obstetrics negative OB ROS                              Anesthesia Physical Anesthesia Plan  ASA: 3  Anesthesia Plan: MAC   Post-op Pain Management:    Induction: Intravenous  PONV Risk Score and Plan:   Airway Management Planned: Natural Airway and Nasal Cannula  Additional Equipment:   Intra-op Plan:   Post-operative Plan:   Informed Consent: I have reviewed the patients History and Physical, chart, labs and discussed the procedure including the risks, benefits and alternatives for the proposed anesthesia with the patient or authorized representative who has indicated his/her understanding and acceptance.     Dental Advisory Given  Plan Discussed with: Anesthesiologist, CRNA and Surgeon  Anesthesia Plan Comments: (Patient consented for risks of anesthesia including but not limited to:  - adverse reactions to medications - damage to eyes, teeth, lips or other oral mucosa - nerve damage due to positioning  - sore throat or hoarseness - Damage to heart, brain, nerves, lungs, other parts of body or loss of life  Patient voiced understanding and assent.)         Anesthesia Quick Evaluation

## 2024-04-13 NOTE — Discharge Instructions (Signed)

## 2024-04-14 ENCOUNTER — Ambulatory Visit
Admission: RE | Admit: 2024-04-14 | Discharge: 2024-04-14 | Disposition: A | Discharge status: Home / Self Care | Attending: Ophthalmology | Admitting: Ophthalmology

## 2024-04-14 ENCOUNTER — Other Ambulatory Visit: Payer: Self-pay

## 2024-04-14 ENCOUNTER — Ambulatory Visit: Payer: Self-pay | Admitting: Anesthesiology

## 2024-04-14 ENCOUNTER — Encounter
Admission: RE | Disposition: A | Discharge status: Home / Self Care | Payer: Self-pay | Source: Home / Self Care | Attending: Ophthalmology

## 2024-04-14 ENCOUNTER — Ambulatory Visit: Admitting: Family

## 2024-04-14 ENCOUNTER — Encounter: Payer: Self-pay | Admitting: Ophthalmology

## 2024-04-14 DIAGNOSIS — M1990 Unspecified osteoarthritis, unspecified site: Secondary | ICD-10-CM | POA: Diagnosis not present

## 2024-04-14 DIAGNOSIS — E039 Hypothyroidism, unspecified: Secondary | ICD-10-CM | POA: Diagnosis not present

## 2024-04-14 DIAGNOSIS — I10 Essential (primary) hypertension: Secondary | ICD-10-CM | POA: Insufficient documentation

## 2024-04-14 DIAGNOSIS — Z8711 Personal history of peptic ulcer disease: Secondary | ICD-10-CM | POA: Diagnosis not present

## 2024-04-14 DIAGNOSIS — Z87891 Personal history of nicotine dependence: Secondary | ICD-10-CM | POA: Insufficient documentation

## 2024-04-14 DIAGNOSIS — Z7984 Long term (current) use of oral hypoglycemic drugs: Secondary | ICD-10-CM | POA: Diagnosis not present

## 2024-04-14 DIAGNOSIS — I2510 Atherosclerotic heart disease of native coronary artery without angina pectoris: Secondary | ICD-10-CM | POA: Diagnosis not present

## 2024-04-14 DIAGNOSIS — H2512 Age-related nuclear cataract, left eye: Secondary | ICD-10-CM | POA: Diagnosis not present

## 2024-04-14 DIAGNOSIS — E1136 Type 2 diabetes mellitus with diabetic cataract: Secondary | ICD-10-CM | POA: Diagnosis not present

## 2024-04-14 DIAGNOSIS — Z8249 Family history of ischemic heart disease and other diseases of the circulatory system: Secondary | ICD-10-CM | POA: Insufficient documentation

## 2024-04-14 HISTORY — DX: Atherosclerosis of aorta: I70.0

## 2024-04-14 HISTORY — PX: CATARACT EXTRACTION W/PHACO: SHX586

## 2024-04-14 HISTORY — DX: Atherosclerotic heart disease of native coronary artery without angina pectoris: I25.10

## 2024-04-14 HISTORY — DX: Unspecified osteoarthritis, unspecified site: M19.90

## 2024-04-14 HISTORY — DX: Other ill-defined heart diseases: I51.89

## 2024-04-14 HISTORY — DX: Hypothyroidism, unspecified: E03.9

## 2024-04-14 HISTORY — DX: Type 2 diabetes mellitus without complications: E11.9

## 2024-04-14 HISTORY — DX: Other complications of anesthesia, initial encounter: T88.59XA

## 2024-04-14 HISTORY — DX: Other specified postprocedural states: Z98.890

## 2024-04-14 LAB — GLUCOSE, CAPILLARY: Glucose-Capillary: 104 mg/dL — ABNORMAL HIGH (ref 70–99)

## 2024-04-14 SURGERY — PHACOEMULSIFICATION, CATARACT, WITH IOL INSERTION
Anesthesia: Monitor Anesthesia Care | Site: Eye | Laterality: Left

## 2024-04-14 MED ORDER — LIDOCAINE HCL (PF) 2 % IJ SOLN
INTRAOCULAR | Status: DC | PRN
  Administered 2024-04-14: 4 mL via INTRAOCULAR

## 2024-04-14 MED ORDER — SIGHTPATH DOSE#1 BSS IO SOLN
INTRAOCULAR | Status: DC | PRN
  Administered 2024-04-14: 91 mL via OPHTHALMIC

## 2024-04-14 MED ORDER — ARMC OPHTHALMIC DILATING DROPS
OPHTHALMIC | Status: AC
  Filled 2024-04-14: qty 0.5

## 2024-04-14 MED ORDER — SIGHTPATH DOSE#1 BSS IO SOLN
INTRAOCULAR | Status: DC | PRN
  Administered 2024-04-14: 15 mL via INTRAOCULAR

## 2024-04-14 MED ORDER — SIGHTPATH DOSE#1 NA HYALUR & NA CHOND-NA HYALUR IO KIT
PACK | INTRAOCULAR | Status: DC | PRN
  Administered 2024-04-14: 1 via OPHTHALMIC

## 2024-04-14 MED ORDER — FENTANYL CITRATE (PF) 100 MCG/2ML IJ SOLN
INTRAMUSCULAR | Status: AC
  Filled 2024-04-14: qty 2

## 2024-04-14 MED ORDER — ONDANSETRON HCL 4 MG/2ML IJ SOLN
INTRAMUSCULAR | Status: AC
  Filled 2024-04-14: qty 2

## 2024-04-14 MED ORDER — ARMC OPHTHALMIC DILATING DROPS
1.0000 | OPHTHALMIC | Status: DC | PRN
  Administered 2024-04-14 (×3): 1 via OPHTHALMIC

## 2024-04-14 MED ORDER — BRIMONIDINE TARTRATE-TIMOLOL 0.2-0.5 % OP SOLN
OPHTHALMIC | Status: DC | PRN
  Administered 2024-04-14: 1 [drp] via OPHTHALMIC

## 2024-04-14 MED ORDER — MIDAZOLAM HCL 2 MG/2ML IJ SOLN
INTRAMUSCULAR | Status: DC | PRN
  Administered 2024-04-14 (×2): 1 mg via INTRAVENOUS

## 2024-04-14 MED ORDER — TETRACAINE HCL 0.5 % OP SOLN
OPHTHALMIC | Status: AC
Start: 2024-04-14 — End: 2024-04-14
  Filled 2024-04-14: qty 4

## 2024-04-14 MED ORDER — TETRACAINE HCL 0.5 % OP SOLN
1.0000 [drp] | OPHTHALMIC | Status: DC | PRN
  Administered 2024-04-14 (×3): 1 [drp] via OPHTHALMIC

## 2024-04-14 MED ORDER — MIDAZOLAM HCL 2 MG/2ML IJ SOLN
INTRAMUSCULAR | Status: AC
  Filled 2024-04-14: qty 2

## 2024-04-14 MED ORDER — ONDANSETRON HCL 4 MG/2ML IJ SOLN
4.0000 mg | Freq: Once | INTRAMUSCULAR | Status: AC
  Administered 2024-04-14: 4 mg via INTRAVENOUS

## 2024-04-14 MED ORDER — MOXIFLOXACIN HCL 0.5 % OP SOLN
OPHTHALMIC | Status: DC | PRN
  Administered 2024-04-14: .2 mL via OPHTHALMIC

## 2024-04-14 MED ORDER — LACTATED RINGERS IV SOLN
INTRAVENOUS | Status: DC
Start: 2024-04-14 — End: 2024-04-14

## 2024-04-14 MED ORDER — FENTANYL CITRATE (PF) 100 MCG/2ML IJ SOLN
INTRAMUSCULAR | Status: DC | PRN
  Administered 2024-04-14: 50 ug via INTRAVENOUS

## 2024-04-14 SURGICAL SUPPLY — 10 items
DISSECTOR HYDRO NUCLEUS 50X22 (MISCELLANEOUS) ×1 IMPLANT
DRSG TEGADERM 2-3/8X2-3/4 SM (GAUZE/BANDAGES/DRESSINGS) ×1 IMPLANT
FEE CATARACT SUITE SIGHTPATH (MISCELLANEOUS) ×1 IMPLANT
GLOVE BIOGEL PI IND STRL 8 (GLOVE) ×1 IMPLANT
GLOVE SURG LX STRL 7.5 STRW (GLOVE) ×1 IMPLANT
GLOVE SURG SYN 6.5 PF PI BL (GLOVE) ×1 IMPLANT
LENS IOL TECNIS MONO 21.5 (Intraocular Lens) IMPLANT
NDL FILTER BLUNT 18X1 1/2 (NEEDLE) ×1 IMPLANT
NEEDLE FILTER BLUNT 18X1 1/2 (NEEDLE) ×1 IMPLANT
SYR 3ML LL SCALE MARK (SYRINGE) ×1 IMPLANT

## 2024-04-14 NOTE — Op Note (Signed)
 OPERATIVE NOTE  Alicia Ramos [id number] 04/14/2024   PREOPERATIVE DIAGNOSIS: Nuclear sclerotic cataract left eye. H25.12   POSTOPERATIVE DIAGNOSIS: Nuclear sclerotic cataract left eye. H25.12   PROCEDURE:  Phacoemulsification with posterior chamber intraocular lens placement of the left eye  Ultrasound time: Procedure(s): PHACOEMULSIFICATION, CATARACT, WITH IOL INSERTION 18.49 01:45.7 (Left)  LENS:   Implant Name Type Inv. Item Serial No. Manufacturer Lot No. LRB No. Used Action  LENS IOL TECNIS MONO 21.5 - [id number] Intraocular Lens LENS IOL TECNIS MONO 21.5 [id number] [employer]  Left 1 Implanted      SURGEON:  Feliciano HERO. Enola, MD   ANESTHESIA:  Topical with tetracaine  drops, augmented with 1% preservative-free intracameral lidocaine .   COMPLICATIONS:  None.   DESCRIPTION OF PROCEDURE:  The patient was identified in the holding room and transported to the operating room and placed in the supine position under the operating microscope.  The left eye was identified as the operative eye, which was prepped and draped in the usual sterile ophthalmic fashion.   A 1 millimeter clear-corneal paracentesis was made inferotemporally. Preservative-free 1% lidocaine  mixed with 1:1,000 bisulfite-free aqueous solution of epinephrine  was injected into the anterior chamber. The anterior chamber was then filled with Viscoat viscoelastic. A 2.4 millimeter keratome was used to make a clear-corneal incision superotemporally. A curvilinear capsulorrhexis was made with a cystotome and capsulorrhexis forceps. Balanced salt solution was used to hydrodissect and hydrodelineate the nucleus. Phacoemulsification was then used to remove the lens nucleus and epinucleus. The remaining cortex was then removed using the irrigation and aspiration handpiece. Provisc was then placed into the capsular bag to distend it for lens placement. A +21.50 D [id number] intraocular lens was then injected into the capsular bag. The  remaining viscoelastic was aspirated.   Wounds were hydrated with balanced salt solution.  The anterior chamber was inflated to a physiologic pressure with balanced salt solution.  No wound leaks were noted. Moxifloxacin  was injected intracamerally.  Timolol  and Brimonidine  drops were applied to the eye.  The patient was taken to the recovery room in stable condition without complications of anesthesia or surgery.  [name] 04/14/2024, 9:32 AM

## 2024-04-14 NOTE — Anesthesia Postprocedure Evaluation (Signed)
 Anesthesia Post Note  Patient: Alicia Ramos  Procedure(s) Performed: PHACOEMULSIFICATION, CATARACT, WITH IOL INSERTION 18.49 01:45.7 (Left: Eye)  Patient location during evaluation: PACU Anesthesia Type: MAC Level of consciousness: awake and alert Pain management: pain level controlled Vital Signs Assessment: post-procedure vital signs reviewed and stable Respiratory status: spontaneous breathing, nonlabored ventilation, respiratory function stable and patient connected to nasal cannula oxygen Cardiovascular status: stable and blood pressure returned to baseline Postop Assessment: no apparent nausea or vomiting Anesthetic complications: no   No notable events documented.   Last Vitals:  Vitals:   04/14/24 0934 04/14/24 0938  BP: 114/60 111/63  Pulse: 79 86  Resp: 14 (!) 28  Temp: (!) 36 C (!) 36 C  SpO2: 96% 93%    Last Pain:  Vitals:   04/14/24 0938  TempSrc:   PainSc: 0-No pain                 Donny JAYSON Mu

## 2024-04-14 NOTE — Transfer of Care (Signed)
 Immediate Anesthesia Transfer of Care Note  Patient: Alicia Ramos  Procedure(s) Performed: PHACOEMULSIFICATION, CATARACT, WITH IOL INSERTION 18.49 01:45.7 (Left: Eye)  Patient Location: PACU  Anesthesia Type: MAC  Level of Consciousness: awake, alert  and patient cooperative  Airway and Oxygen Therapy: Patient Spontanous Breathing and Patient connected to supplemental oxygen  Post-op Assessment: Post-op Vital signs reviewed, Patient's Cardiovascular Status Stable, Respiratory Function Stable, Patent Airway and No signs of Nausea or vomiting  Post-op Vital Signs: Reviewed and stable  Complications: No notable events documented.

## 2024-04-14 NOTE — H&P (Signed)
 [facility]   Primary Care Physician:  Dineen Rollene MATSU, FNP Ophthalmologist: Dr. Feliciano Ober  Pre-Procedure History & Physical: HPI:  Alicia Ramos is a 73 y.o. female here for cataract surgery.   Past Medical History:  Diagnosis Date   Arthritis    Atherosclerosis of aorta (HCC)    Complication of anesthesia    Coronary artery disease involving native coronary artery of native heart without angina pectoris    Diabetes mellitus without complication (HCC)    Esophageal ulcer 2000   Required blood transfusion   Grade I diastolic dysfunction    HTN (hypertension)    Hyperlipidemia    Tried statin but had severe myalgia   Hypothyroidism    not on meds currently   PONV (postoperative nausea and vomiting)    Shingles 2001   Left chest   Skin cancer 2018   left shoulder   Vaginal delivery    x1    Past Surgical History:  Procedure Laterality Date   ABDOMINAL HYSTERECTOMY     no cervix seen on pelvic exam; 12/2016, [facility]   COLONOSCOPY WITH PROPOFOL  N/A 07/15/2017   Procedure: COLONOSCOPY WITH PROPOFOL ;  Surgeon: Viktoria Lamar DASEN, MD;  Location: [facility];  Service: Endoscopy;  Laterality: N/A;    Prior to Admission medications   Medication Sig Start Date End Date Taking? Authorizing Provider  Multiple Vitamins-Minerals (CENTRUM SILVER WOMEN 50+) TABS Take 1 tablet by mouth daily.   Yes [provider]  acetaminophen (TYLENOL) 500 MG tablet Take 500 mg by mouth every 6 (six) hours as needed.    [provider]  amLODipine  (NORVASC ) 2.5 MG tablet TAKE 1 TABLET BY MOUTH EVERY DAY 04/04/24   Dineen Rollene MATSU, FNP  amLODipine  (NORVASC ) 5 MG tablet TAKE 1 TABLET (5 MG TOTAL) BY MOUTH DAILY. 04/07/23   Dineen Rollene MATSU, FNP  aspirin  EC 81 MG tablet Take 1 tablet (81 mg total) by mouth daily. Swallow whole. 01/22/21   Darliss Rogue, MD  carvedilol  (COREG ) 6.25 MG tablet TAKE 1 TABLET BY MOUTH TWICE A DAY WITH FOOD 11/06/23   Dineen Rollene MATSU,  FNP  fenofibrate  (TRICOR ) 145 MG tablet TAKE 1 TABLET BY MOUTH EVERY DAY 11/06/23   Dineen Rollene MATSU, FNP  metFORMIN  (GLUCOPHAGE -XR) 500 MG 24 hr tablet TAKE 3 TABLETS (1,500 MG TOTAL) BY MOUTH EVERY EVENING. 02/03/24   Dineen Rollene MATSU, FNP  rosuvastatin  (CRESTOR ) 20 MG tablet TAKE 1 TABLET BY MOUTH EVERY DAY 11/06/23   Dineen Rollene MATSU, FNP  VITAMIN D  PO Take 800 mg by mouth.    [provider]    Allergies as of 03/30/2024 - Review Complete 12/09/2023  Allergen Reaction Noted   Ace inhibitors  03/30/2014   Losartan   03/30/2014   Statins  08/01/2011    Family History  Problem Relation Age of Onset   Hypertension Mother    Lung cancer Father 26   Throat cancer Father    Hypertension Sister    Heart disease Maternal Aunt    Heart disease Maternal Uncle    Breast cancer Paternal Aunt    Thyroid  disease Neg Hx     Social History   Socioeconomic History   Marital status: Divorced    Spouse name: Not on file   Number of children: 1   Years of education: Not on file   Highest education level: Not on file  Occupational History   Not on file  Tobacco Use   Smoking status: Former  Current packs/day: 0.00    Average packs/day: 1 pack/day for 39.0 years (39.0 ttl pk-yrs)    Types: Cigarettes    Start date: 07/31/1964    Quit date: 08/01/2003    Years since quitting: 20.7   Smokeless tobacco: Never  Vaping Use   Vaping status: Never Used  Substance and Sexual Activity   Alcohol use: Yes    Comment: 1 glass of wine or beer monthly or less   Drug use: Never   Sexual activity: Not Currently  Other Topics Concern   Not on file  Social History Narrative   Regular Exercise -  YesLives in [place]    Social Drivers of Health   Financial Resource Strain: Low Risk  (05/07/2023)   Overall Financial Resource Strain (CARDIA)    Difficulty of Paying Living Expenses: Not hard at all  Food Insecurity: No Food Insecurity (05/07/2023)   Hunger Vital Sign    Worried  About Running Out of Food in the Last Year: Never true    Ran Out of Food in the Last Year: Never true  Transportation Needs: No Transportation Needs (05/07/2023)   PRAPARE - Administrator, Civil Service (Medical): No    Lack of Transportation (Non-Medical): No  Physical Activity: Insufficiently Active (05/07/2023)   Exercise Vital Sign    Days of Exercise per Week: 1 day    Minutes of Exercise per Session: 50 min  Stress: No Stress Concern Present (05/07/2023)   [employer] of Occupational Health - Occupational Stress Questionnaire    Feeling of Stress : Not at all  Social Connections: Moderately Isolated (05/07/2023)   Social Connection and Isolation Panel    Frequency of Communication with Friends and Family: More than three times a week    Frequency of Social Gatherings with Friends and Family: More than three times a week    Attends Religious Services: More than 4 times per year    Active Member of [employer] or Organizations: No    Attends Banker Meetings: Never    Marital Status: Divorced  Catering manager Violence: Not At Risk (05/07/2023)   Humiliation, Afraid, Rape, and Kick questionnaire    Fear of Current or Ex-Partner: No    Emotionally Abused: No    Physically Abused: No    Sexually Abused: No    Review of Systems: See HPI, otherwise negative ROS  Physical Exam: Ht 5' 9 (1.753 m)   Wt 90.7 kg   BMI 29.53 kg/m  General:   Alert, cooperative in NAD Head:  Normocephalic and atraumatic. Respiratory:  Normal work of breathing. Cardiovascular:  RRR  Impression/Plan: Alicia Ramos is here for cataract surgery.  Risks, benefits, limitations, and alternatives regarding cataract surgery have been reviewed with the patient.  Questions have been answered.  All parties agreeable.   Feliciano Bryan Ober, MD  04/14/2024, 7:14 AM

## 2024-04-15 DIAGNOSIS — H2511 Age-related nuclear cataract, right eye: Secondary | ICD-10-CM | POA: Diagnosis not present

## 2024-04-27 NOTE — Anesthesia Preprocedure Evaluation (Signed)
 Anesthesia Evaluation  Patient identified by MRN, date of birth, ID band Patient awake    Reviewed: Allergy & Precautions, H&P , NPO status , Patient's Chart, lab work & pertinent test results  History of Anesthesia Complications (+) PONV and history of anesthetic complications  Airway Mallampati: I  TM Distance: >3 FB Neck ROM: Full    Dental no notable dental hx. (+) Upper Dentures, Implants Upper Dentures, Implants Lower jaw implants front and lateral incisors, upper dentures:  :   Pulmonary neg pulmonary ROS, former smoker   Pulmonary exam normal breath sounds clear to auscultation       Cardiovascular hypertension, + CAD  negative cardio ROS Normal cardiovascular exam Rhythm:Regular Rate:Normal  03-15-21 echo  1. Left ventricular ejection fraction, by estimation, is 60 to 65%. The  left ventricle has normal function. The left ventricle has no regional  wall motion abnormalities. Left ventricular diastolic parameters are  consistent with Grade I diastolic  dysfunction (impaired relaxation).   2. Right ventricular systolic function is normal. The right ventricular  size is normal. There is mildly elevated pulmonary artery systolic  pressure.   3. Left atrial size was mildly dilated.    Neuro/Psych negative neurological ROS  negative psych ROS   GI/Hepatic negative GI ROS, Neg liver ROS, PUD,,,  Endo/Other  negative endocrine ROSdiabetesHypothyroidism    Renal/GU negative Renal ROS  negative genitourinary   Musculoskeletal negative musculoskeletal ROS (+) Arthritis ,    Abdominal   Peds negative pediatric ROS (+)  Hematology negative hematology ROS (+)   Anesthesia Other Findings Previous cataract 04-14-24 Dr. Ola  HTN (hypertension)             Hyperlipidemia Esophageal ulcer    Shingles Vaginal delivery            Skin cancer Complication of anesthesia   PONV (postoperative nausea and  vomiting) Diabetes mellitus without complication (HCC)Arthritis Hypothyroidism             Atherosclerosis of aorta (HCC) Coronary artery disease involving native coronary artery of native heart without angina pectoris       Grade I diastolic dysfunction   Reproductive/Obstetrics negative OB ROS                              Anesthesia Physical Anesthesia Plan  ASA: 3  Anesthesia Plan: MAC   Post-op Pain Management:    Induction: Intravenous  PONV Risk Score and Plan:   Airway Management Planned: Natural Airway and Nasal Cannula  Additional Equipment:   Intra-op Plan:   Post-operative Plan:   Informed Consent: I have reviewed the patients History and Physical, chart, labs and discussed the procedure including the risks, benefits and alternatives for the proposed anesthesia with the patient or authorized representative who has indicated his/her understanding and acceptance.     Dental Advisory Given  Plan Discussed with: Anesthesiologist, CRNA and Surgeon  Anesthesia Plan Comments: (Patient consented for risks of anesthesia including but not limited to:  - adverse reactions to medications - damage to eyes, teeth, lips or other oral mucosa - nerve damage due to positioning  - sore throat or hoarseness - Damage to heart, brain, nerves, lungs, other parts of body or loss of life  Patient voiced understanding and assent.)         Anesthesia Quick Evaluation

## 2024-04-27 NOTE — Discharge Instructions (Signed)

## 2024-04-28 ENCOUNTER — Other Ambulatory Visit: Payer: Self-pay

## 2024-04-28 ENCOUNTER — Ambulatory Visit: Payer: Self-pay | Admitting: Anesthesiology

## 2024-04-28 ENCOUNTER — Ambulatory Visit
Admission: RE | Admit: 2024-04-28 | Discharge: 2024-04-28 | Disposition: A | Discharge status: Home / Self Care | Attending: Ophthalmology | Admitting: Ophthalmology

## 2024-04-28 ENCOUNTER — Encounter: Payer: Self-pay | Admitting: Ophthalmology

## 2024-04-28 ENCOUNTER — Encounter: Admission: RE | Disposition: A | Payer: Self-pay | Source: Home / Self Care | Attending: Ophthalmology

## 2024-04-28 DIAGNOSIS — I10 Essential (primary) hypertension: Secondary | ICD-10-CM | POA: Insufficient documentation

## 2024-04-28 DIAGNOSIS — E1136 Type 2 diabetes mellitus with diabetic cataract: Secondary | ICD-10-CM | POA: Insufficient documentation

## 2024-04-28 DIAGNOSIS — H2511 Age-related nuclear cataract, right eye: Secondary | ICD-10-CM | POA: Diagnosis not present

## 2024-04-28 DIAGNOSIS — Z7984 Long term (current) use of oral hypoglycemic drugs: Secondary | ICD-10-CM | POA: Diagnosis not present

## 2024-04-28 DIAGNOSIS — I2510 Atherosclerotic heart disease of native coronary artery without angina pectoris: Secondary | ICD-10-CM | POA: Diagnosis not present

## 2024-04-28 DIAGNOSIS — Z87891 Personal history of nicotine dependence: Secondary | ICD-10-CM | POA: Diagnosis not present

## 2024-04-28 HISTORY — PX: CATARACT EXTRACTION W/PHACO: SHX586

## 2024-04-28 LAB — GLUCOSE, CAPILLARY: Glucose-Capillary: 115 mg/dL — ABNORMAL HIGH (ref 70–99)

## 2024-04-28 SURGERY — PHACOEMULSIFICATION, CATARACT, WITH IOL INSERTION
Anesthesia: Monitor Anesthesia Care | Site: Eye | Laterality: Right

## 2024-04-28 MED ORDER — MIDAZOLAM HCL 2 MG/2ML IJ SOLN
INTRAMUSCULAR | Status: DC | PRN
  Administered 2024-04-28: 2 mg via INTRAVENOUS

## 2024-04-28 MED ORDER — MIDAZOLAM HCL 2 MG/2ML IJ SOLN
INTRAMUSCULAR | Status: AC
  Filled 2024-04-28: qty 2

## 2024-04-28 MED ORDER — FENTANYL CITRATE (PF) 100 MCG/2ML IJ SOLN
INTRAMUSCULAR | Status: AC
  Filled 2024-04-28: qty 2

## 2024-04-28 MED ORDER — SIGHTPATH DOSE#1 BSS IO SOLN
INTRAOCULAR | Status: DC | PRN
  Administered 2024-04-28: 105 mL via OPHTHALMIC

## 2024-04-28 MED ORDER — ARMC OPHTHALMIC DILATING DROPS
OPHTHALMIC | Status: AC
  Filled 2024-04-28: qty 0.5

## 2024-04-28 MED ORDER — TETRACAINE HCL 0.5 % OP SOLN
1.0000 [drp] | OPHTHALMIC | Status: DC | PRN
  Administered 2024-04-28 (×3): 1 [drp] via OPHTHALMIC

## 2024-04-28 MED ORDER — ARMC OPHTHALMIC DILATING DROPS
1.0000 | OPHTHALMIC | Status: DC | PRN
  Administered 2024-04-28 (×3): 1 via OPHTHALMIC

## 2024-04-28 MED ORDER — SIGHTPATH DOSE#1 BSS IO SOLN
INTRAOCULAR | Status: DC | PRN
  Administered 2024-04-28: 15 mL via INTRAOCULAR

## 2024-04-28 MED ORDER — MOXIFLOXACIN HCL 0.5 % OP SOLN
OPHTHALMIC | Status: DC | PRN
  Administered 2024-04-28: .2 mL via OPHTHALMIC

## 2024-04-28 MED ORDER — BRIMONIDINE TARTRATE-TIMOLOL 0.2-0.5 % OP SOLN
OPHTHALMIC | Status: DC | PRN
  Administered 2024-04-28: 1 [drp] via OPHTHALMIC

## 2024-04-28 MED ORDER — SIGHTPATH DOSE#1 NA HYALUR & NA CHOND-NA HYALUR IO KIT
PACK | INTRAOCULAR | Status: DC | PRN
  Administered 2024-04-28: 1 via OPHTHALMIC

## 2024-04-28 MED ORDER — FENTANYL CITRATE (PF) 100 MCG/2ML IJ SOLN
INTRAMUSCULAR | Status: DC | PRN
  Administered 2024-04-28: 50 ug via INTRAVENOUS

## 2024-04-28 MED ORDER — LACTATED RINGERS IV SOLN: INTRAVENOUS | Status: DC

## 2024-04-28 MED ORDER — TETRACAINE HCL 0.5 % OP SOLN
OPHTHALMIC | Status: AC
Start: 2024-04-28 — End: 2024-04-28
  Filled 2024-04-28: qty 4

## 2024-04-28 MED ORDER — LIDOCAINE HCL (PF) 2 % IJ SOLN
INTRAOCULAR | Status: DC | PRN
  Administered 2024-04-28: 4 mL via INTRAOCULAR

## 2024-04-28 SURGICAL SUPPLY — 10 items
DISSECTOR HYDRO NUCLEUS 50X22 (MISCELLANEOUS) ×1 IMPLANT
DRSG TEGADERM 2-3/8X2-3/4 SM (GAUZE/BANDAGES/DRESSINGS) ×1 IMPLANT
FEE CATARACT SUITE SIGHTPATH (MISCELLANEOUS) ×1 IMPLANT
GLOVE BIOGEL PI IND STRL 8 (GLOVE) ×1 IMPLANT
GLOVE SURG LX STRL 7.5 STRW (GLOVE) ×1 IMPLANT
GLOVE SURG SYN 6.5 PF PI BL (GLOVE) ×1 IMPLANT
LENS IOL TECNIS MONO 21.5 (Intraocular Lens) IMPLANT
NDL FILTER BLUNT 18X1 1/2 (NEEDLE) ×1 IMPLANT
NEEDLE FILTER BLUNT 18X1 1/2 (NEEDLE) ×1 IMPLANT
SYR 3ML LL SCALE MARK (SYRINGE) ×1 IMPLANT

## 2024-04-28 NOTE — H&P (Signed)
 [facility]   Primary Care Physician:  Dineen Rollene MATSU, FNP Ophthalmologist: Dr. Feliciano Ober  Pre-Procedure History & Physical: HPI:  Alicia Ramos is a 73 y.o. female here for cataract surgery.   Past Medical History:  Diagnosis Date   Arthritis    Atherosclerosis of aorta    Complication of anesthesia    Coronary artery disease involving native coronary artery of native heart without angina pectoris    Diabetes mellitus without complication (HCC)    Esophageal ulcer 2000   Required blood transfusion   Grade I diastolic dysfunction    HTN (hypertension)    Hyperlipidemia    Tried statin but had severe myalgia   Hypothyroidism    not on meds currently   PONV (postoperative nausea and vomiting)    Shingles 2001   Left chest   Skin cancer 2018   left shoulder   Vaginal delivery    x1    Past Surgical History:  Procedure Laterality Date   ABDOMINAL HYSTERECTOMY     no cervix seen on pelvic exam; 12/2016, [facility]   CATARACT EXTRACTION W/PHACO Left 04/14/2024   Procedure: PHACOEMULSIFICATION, CATARACT, WITH IOL INSERTION 18.49 01:45.7;  Surgeon: Ober Feliciano Hugger, MD;  Location: [facility];  Service: Ophthalmology;  Laterality: Left;   COLONOSCOPY WITH PROPOFOL  N/A 07/15/2017   Procedure: COLONOSCOPY WITH PROPOFOL ;  Surgeon: Viktoria Lamar DASEN, MD;  Location: [facility] ENDOSCOPY;  Service: Endoscopy;  Laterality: N/A;    Prior to Admission medications   Medication Sig Start Date End Date Taking? Authorizing Provider  acetaminophen (TYLENOL) 500 MG tablet Take 500 mg by mouth every 6 (six) hours as needed.   Yes [provider]  amLODipine  (NORVASC ) 2.5 MG tablet TAKE 1 TABLET BY MOUTH EVERY DAY 04/04/24  Yes Arnett, Rollene MATSU, FNP  amLODipine  (NORVASC ) 5 MG tablet TAKE 1 TABLET (5 MG TOTAL) BY MOUTH DAILY. 04/07/23  Yes Dineen Rollene MATSU, FNP  aspirin  EC 81 MG tablet Take 1 tablet (81 mg total) by mouth daily. Swallow whole. 01/22/21  Yes  Agbor-Etang, Redell, MD  carvedilol  (COREG ) 6.25 MG tablet TAKE 1 TABLET BY MOUTH TWICE A DAY WITH FOOD 11/06/23  Yes Dineen Rollene MATSU, FNP  fenofibrate  (TRICOR ) 145 MG tablet TAKE 1 TABLET BY MOUTH EVERY DAY 11/06/23  Yes Dineen Rollene MATSU, FNP  metFORMIN  (GLUCOPHAGE -XR) 500 MG 24 hr tablet TAKE 3 TABLETS (1,500 MG TOTAL) BY MOUTH EVERY EVENING. 02/03/24  Yes Arnett, Rollene MATSU, FNP  Multiple Vitamins-Minerals (CENTRUM SILVER WOMEN 50+) TABS Take 1 tablet by mouth daily.   Yes [provider]  rosuvastatin  (CRESTOR ) 20 MG tablet TAKE 1 TABLET BY MOUTH EVERY DAY 11/06/23  Yes Dineen Rollene MATSU, FNP  VITAMIN D  PO Take 800 mg by mouth.   Yes [provider]    Allergies as of 03/30/2024 - Review Complete 12/09/2023  Allergen Reaction Noted   Ace inhibitors  03/30/2014   Losartan   03/30/2014   Statins  08/01/2011    Family History  Problem Relation Age of Onset   Hypertension Mother    Lung cancer Father 88   Throat cancer Father    Hypertension Sister    Heart disease Maternal Aunt    Heart disease Maternal Uncle    Breast cancer Paternal Aunt    Thyroid  disease Neg Hx     Social History   Socioeconomic History   Marital status: Divorced    Spouse name: Not on file   Number of children: 1  Years of education: Not on file   Highest education level: Not on file  Occupational History   Not on file  Tobacco Use   Smoking status: Former    Current packs/day: 0.00    Average packs/day: 1 pack/day for 39.0 years (39.0 ttl pk-yrs)    Types: Cigarettes    Start date: 07/31/1964    Quit date: 08/01/2003    Years since quitting: 20.7   Smokeless tobacco: Never  Vaping Use   Vaping status: Never Used  Substance and Sexual Activity   Alcohol use: Yes    Comment: 1 glass of wine or beer monthly or less   Drug use: Never   Sexual activity: Not Currently  Other Topics Concern   Not on file  Social History Narrative   Regular Exercise -  YesLives in [place]     Social Drivers of Health   Financial Resource Strain: Low Risk  (05/07/2023)   Overall Financial Resource Strain (CARDIA)    Difficulty of Paying Living Expenses: Not hard at all  Food Insecurity: No Food Insecurity (05/07/2023)   Hunger Vital Sign    Worried About Running Out of Food in the Last Year: Never true    Ran Out of Food in the Last Year: Never true  Transportation Needs: No Transportation Needs (05/07/2023)   PRAPARE - Administrator, Civil Service (Medical): No    Lack of Transportation (Non-Medical): No  Physical Activity: Insufficiently Active (05/07/2023)   Exercise Vital Sign    Days of Exercise per Week: 1 day    Minutes of Exercise per Session: 50 min  Stress: No Stress Concern Present (05/07/2023)   [employer] of Occupational Health - Occupational Stress Questionnaire    Feeling of Stress : Not at all  Social Connections: Moderately Isolated (05/07/2023)   Social Connection and Isolation Panel    Frequency of Communication with Friends and Family: More than three times a week    Frequency of Social Gatherings with Friends and Family: More than three times a week    Attends Religious Services: More than 4 times per year    Active Member of [employer] or Organizations: No    Attends Banker Meetings: Never    Marital Status: Divorced  Catering manager Violence: Not At Risk (05/07/2023)   Humiliation, Afraid, Rape, and Kick questionnaire    Fear of Current or Ex-Partner: No    Emotionally Abused: No    Physically Abused: No    Sexually Abused: No    Review of Systems: See HPI, otherwise negative ROS  Physical Exam: BP (!) 164/75   Pulse 88   Temp (!) 97.1 F (36.2 C)   Wt 90.7 kg   SpO2 96%   BMI 29.53 kg/m  General:   Alert, cooperative in NAD Head:  Normocephalic and atraumatic. Respiratory:  Normal work of breathing. Cardiovascular:  RRR  Impression/Plan: Alicia Ramos is here for cataract surgery.  Risks,  benefits, limitations, and alternatives regarding cataract surgery have been reviewed with the patient.  Questions have been answered.  All parties agreeable.   Feliciano Bryan Ober, MD  04/28/2024, 7:07 AM

## 2024-04-28 NOTE — Anesthesia Postprocedure Evaluation (Signed)
 Anesthesia Post Note  Patient: Alicia Ramos  Procedure(s) Performed: PHACOEMULSIFICATION, CATARACT, WITH IOL INSERTION 26.24 01:57.6 (Right: Eye)  Patient location during evaluation: PACU Anesthesia Type: MAC Level of consciousness: awake and alert Pain management: pain level controlled Vital Signs Assessment: post-procedure vital signs reviewed and stable Respiratory status: spontaneous breathing, nonlabored ventilation, respiratory function stable and patient connected to nasal cannula oxygen Cardiovascular status: stable and blood pressure returned to baseline Postop Assessment: no apparent nausea or vomiting Anesthetic complications: no   No notable events documented.   Last Vitals:  Vitals:   04/28/24 0823 04/28/24 0828  BP: 123/60 108/60  Pulse: 86 85  Resp: 14 17  Temp: (!) 36.3 C (!) 36.3 C  SpO2: 97% 96%    Last Pain:  Vitals:   04/28/24 0828  PainSc: 0-No pain                 Bearett Porcaro C Illyanna Petillo

## 2024-04-28 NOTE — Transfer of Care (Signed)
 Immediate Anesthesia Transfer of Care Note  Patient: Alicia Ramos  Procedure(s) Performed: PHACOEMULSIFICATION, CATARACT, WITH IOL INSERTION 26.24 01:57.6 (Right: Eye)  Patient Location: PACU  Anesthesia Type: MAC  Level of Consciousness: awake, alert  and patient cooperative  Airway and Oxygen Therapy: Patient Spontanous Breathing and Patient connected to supplemental oxygen  Post-op Assessment: Post-op Vital signs reviewed, Patient's Cardiovascular Status Stable, Respiratory Function Stable, Patent Airway and No signs of Nausea or vomiting  Post-op Vital Signs: Reviewed and stable  Complications: No notable events documented.

## 2024-04-28 NOTE — Op Note (Signed)
 OPERATIVE NOTE  Alicia Ramos [id number] 04/28/2024   PREOPERATIVE DIAGNOSIS: Nuclear sclerotic cataract right eye. H25.11   POSTOPERATIVE DIAGNOSIS: Nuclear sclerotic cataract right eye. H25.11   PROCEDURE:  Phacoemulsification with posterior chamber intraocular lens placement of the right eye  Ultrasound time: Procedure(s): PHACOEMULSIFICATION, CATARACT, WITH IOL INSERTION 26.24 01:57.6 (Right)  LENS:   Implant Name Type Inv. Item Serial No. Manufacturer Lot No. LRB No. Used Action  LENS IOL TECNIS MONO 21.5 - [id number] Intraocular Lens LENS IOL TECNIS MONO 21.5 [id number] [facility]  Right 1 Implanted      SURGEON:  Feliciano HERO. Enola, MD   ANESTHESIA:  Topical with tetracaine  drops, augmented with 1% preservative-free intracameral lidocaine .   COMPLICATIONS:  None.   DESCRIPTION OF PROCEDURE:  The patient was identified in the holding room and transported to the operating room and placed in the supine position under the operating microscope.  The right eye was identified as the operative eye, which was prepped and draped in the usual sterile ophthalmic fashion.   A 1 millimeter clear-corneal paracentesis was made superotemporally. Preservative-free 1% lidocaine  mixed with 1:1,000 bisulfite-free aqueous solution of epinephrine  was injected into the anterior chamber. The anterior chamber was then filled with Viscoat viscoelastic. A 2.4 millimeter keratome was used to make a clear-corneal incision inferotemporally. A curvilinear capsulorrhexis was made with a cystotome and capsulorrhexis forceps. Balanced salt solution was used to hydrodissect and hydrodelineate the nucleus. Phacoemulsification was then used to remove the lens nucleus and epinucleus. The remaining cortex was then removed using the irrigation and aspiration handpiece. Provisc was then placed into the capsular bag to distend it for lens placement. A +21.50 D DCB00 intraocular lens was then injected into the capsular bag. The  remaining viscoelastic was aspirated.   Wounds were hydrated with balanced salt solution.  The anterior chamber was inflated to a physiologic pressure with balanced salt solution.  No wound leaks were noted. Moxifloxacin  was injected intracamerally.  Timolol  and Brimonidine  drops were applied to the eye.  The patient was taken to the recovery room in stable condition without complications of anesthesia or surgery.  [employer] 04/28/2024, 8:21 AM

## 2024-05-05 ENCOUNTER — Other Ambulatory Visit: Payer: Self-pay | Admitting: Family

## 2024-05-05 DIAGNOSIS — I10 Essential (primary) hypertension: Secondary | ICD-10-CM

## 2024-05-25 ENCOUNTER — Ambulatory Visit (INDEPENDENT_AMBULATORY_CARE_PROVIDER_SITE_OTHER): Admitting: *Deleted

## 2024-05-25 VITALS — Ht 68.0 in | Wt 200.0 lb

## 2024-05-25 DIAGNOSIS — Z0000 Encounter for general adult medical examination without abnormal findings: Secondary | ICD-10-CM

## 2024-05-25 NOTE — Progress Notes (Signed)
 Subjective:   Alicia Ramos is a 73 y.o. who presents for a Medicare Wellness preventive visit.  As a reminder, Annual Wellness Visits don't include a physical exam, and some assessments may be limited, especially if this visit is performed virtually. We may recommend an in-person follow-up visit with your provider if needed.  Visit Complete: Virtual I connected with  Alicia Ramos on 05/25/24 by a audio enabled telemedicine application and verified that I am speaking with the correct person using two identifiers.  Patient Location: Home  Provider Location: Home Office  I discussed the limitations of evaluation and management by telemedicine. The patient expressed understanding and agreed to proceed.  Vital Signs: Because this visit was a virtual/telehealth visit, some criteria may be missing or patient reported. Any vitals not documented were not able to be obtained and vitals that have been documented are patient reported.  VideoDeclined- This patient declined Librarian, academic. Therefore the visit was completed with audio only.  Persons Participating in Visit: Patient.  AWV Questionnaire: No: Patient Medicare AWV questionnaire was not completed prior to this visit.  Cardiac Risk Factors include: advanced age (>62men, >29 women);diabetes mellitus;dyslipidemia;hypertension;obesity (BMI >30kg/m2)     Objective:    Today's Vitals   05/25/24 1134  Weight: 200 lb (90.7 kg)  Height: 5' 8 (1.727 m)   Body mass index is 30.41 kg/m.     05/25/2024   11:47 AM 04/28/2024    6:54 AM 04/14/2024    8:11 AM 05/07/2023    9:14 AM 03/07/2022    9:13 AM 03/06/2021   10:40 AM 03/05/2020   10:45 AM  Advanced Directives  Does Patient Have a Medical Advance Directive? Yes Yes Yes Yes Yes No No  Type of Estate Agent of [place];Living will Healthcare Power of [place];Living will Healthcare Power of [place];Living will Healthcare Power of  [place];Living will Healthcare Power of [place];Living will    Does patient want to make changes to medical advance directive?   No - Patient declined No - Patient declined No - Patient declined No - Patient declined   Copy of Healthcare Power of Attorney in Chart? No - copy requested Yes - validated most recent copy scanned in chart (See row information)  No - copy requested No - copy requested    Would patient like information on creating a medical advance directive?       No - Patient declined    Current Medications (verified) Outpatient Encounter Medications as of 05/25/2024  Medication Sig   acetaminophen (TYLENOL) 500 MG tablet Take 500 mg by mouth every 6 (six) hours as needed.   amLODipine  (NORVASC ) 2.5 MG tablet TAKE 1 TABLET BY MOUTH EVERY DAY   amLODipine  (NORVASC ) 5 MG tablet TAKE 1 TABLET (5 MG TOTAL) BY MOUTH DAILY.   aspirin  EC 81 MG tablet Take 1 tablet (81 mg total) by mouth daily. Swallow whole.   carvedilol  (COREG ) 6.25 MG tablet TAKE 1 TABLET BY MOUTH TWICE A DAY WITH FOOD   fenofibrate  (TRICOR ) 145 MG tablet TAKE 1 TABLET BY MOUTH EVERY DAY   Multiple Vitamins-Minerals (CENTRUM SILVER WOMEN 50+) TABS Take 1 tablet by mouth daily.   rosuvastatin  (CRESTOR ) 20 MG tablet TAKE 1 TABLET BY MOUTH EVERY DAY   VITAMIN D  PO Take 800 mg by mouth.   metFORMIN  (GLUCOPHAGE -XR) 500 MG 24 hr tablet TAKE 3 TABLETS (1,500 MG TOTAL) BY MOUTH EVERY EVENING.   No facility-administered encounter medications on file as of  05/25/2024.    Allergies (verified) Ace inhibitors, Losartan , and Statins   History: Past Medical History:  Diagnosis Date   Arthritis    Atherosclerosis of aorta    Complication of anesthesia    Coronary artery disease involving native coronary artery of native heart without angina pectoris    Diabetes mellitus without complication (HCC)    Esophageal ulcer 2000   Required blood transfusion   Grade I diastolic dysfunction    HTN (hypertension)     Hyperlipidemia    Tried statin but had severe myalgia   Hypothyroidism    not on meds currently   PONV (postoperative nausea and vomiting)    Shingles 2001   Left chest   Skin cancer 2018   left shoulder   Vaginal delivery    x1   Past Surgical History:  Procedure Laterality Date   ABDOMINAL HYSTERECTOMY     no cervix seen on pelvic exam; 12/2016, [facility]   CATARACT EXTRACTION W/PHACO Left 04/14/2024   Procedure: PHACOEMULSIFICATION, CATARACT, WITH IOL INSERTION 18.49 01:45.7;  Surgeon: Enola Feliciano Hugger, MD;  Location: [facility];  Service: Ophthalmology;  Laterality: Left;   CATARACT EXTRACTION W/PHACO Right 04/28/2024   Procedure: PHACOEMULSIFICATION, CATARACT, WITH IOL INSERTION 26.24 01:57.6;  Surgeon: Enola Feliciano Hugger, MD;  Location: [facility];  Service: Ophthalmology;  Laterality: Right;   COLONOSCOPY WITH PROPOFOL  N/A 07/15/2017   Procedure: COLONOSCOPY WITH PROPOFOL ;  Surgeon: Viktoria Lamar DASEN, MD;  Location: [facility];  Service: Endoscopy;  Laterality: N/A;   Family History  Problem Relation Age of Onset   Hypertension Mother    Lung cancer Father 25   Throat cancer Father    Hypertension Sister    Heart disease Maternal Aunt    Heart disease Maternal Uncle    Breast cancer Paternal Aunt    Thyroid  disease Neg Hx    Social History   Socioeconomic History   Marital status: Divorced    Spouse name: Not on file   Number of children: 1   Years of education: Not on file   Highest education level: Not on file  Occupational History   Not on file  Tobacco Use   Smoking status: Former    Current packs/day: 0.00    Average packs/day: 1 pack/day for 39.0 years (39.0 ttl pk-yrs)    Types: Cigarettes    Start date: 07/31/1964    Quit date: 08/01/2003    Years since quitting: 20.8   Smokeless tobacco: Never  Vaping Use   Vaping status: Never Used  Substance and Sexual Activity   Alcohol use: Yes    Comment: 1 glass of wine or beer  monthly or less   Drug use: Never   Sexual activity: Not Currently  Other Topics Concern   Not on file  Social History Narrative   Regular Exercise -  YesLives in [place]    Social Drivers of Health   Financial Resource Strain: Low Risk  (05/25/2024)   Overall Financial Resource Strain (CARDIA)    Difficulty of Paying Living Expenses: Not hard at all  Food Insecurity: No Food Insecurity (05/25/2024)   Hunger Vital Sign    Worried About Running Out of Food in the Last Year: Never true    Ran Out of Food in the Last Year: Never true  Transportation Needs: No Transportation Needs (05/25/2024)   PRAPARE - Administrator, Civil Service (Medical): No    Lack of Transportation (Non-Medical): No  Physical Activity: Insufficiently  Active (05/25/2024)   Exercise Vital Sign    Days of Exercise per Week: 3 days    Minutes of Exercise per Session: 30 min  Stress: No Stress Concern Present (05/25/2024)   [employer] of Occupational Health - Occupational Stress Questionnaire    Feeling of Stress: Not at all  Social Connections: Moderately Integrated (05/25/2024)   Social Connection and Isolation Panel    Frequency of Communication with Friends and Family: More than three times a week    Frequency of Social Gatherings with Friends and Family: More than three times a week    Attends Religious Services: More than 4 times per year    Active Member of [employer] or Organizations: Yes    Attends Engineer, Structural: More than 4 times per year    Marital Status: Divorced    Tobacco Counseling Counseling given: Not Answered    Clinical Intake:  Pre-visit preparation completed: Yes  Pain : No/denies pain     BMI - recorded: 30.41 Nutritional Status: BMI > 30  Obese Nutritional Risks: None Diabetes: Yes CBG done?: No  Lab Results  Component Value Date   HGBA1C 6.3 (A) 10/12/2023   HGBA1C 6.0 04/14/2023   HGBA1C 6.1 11/21/2022     How often do you need  to have someone help you when you read instructions, pamphlets, or other written materials from your doctor or pharmacy?: 1 - Never  Interpreter Needed?: No  Information entered by :: R. Chrisean Kloth LPN   Activities of Daily Living     05/25/2024   11:35 AM 04/28/2024    6:50 AM  In your present state of health, do you have any difficulty performing the following activities:  Hearing? 0 0  Vision? 0 0  Difficulty concentrating or making decisions? 0 0  Walking or climbing stairs? 0   Dressing or bathing? 0   Doing errands, shopping? 0   Preparing Food and eating ? N   Using the Toilet? N   In the past six months, have you accidently leaked urine? N   Do you have problems with loss of bowel control? N   Managing your Medications? N   Managing your Finances? N   Housekeeping or managing your Housekeeping? N     Patient Care Team: Dineen Rollene MATSU, FNP as PCP - General (Family Medicine) Darliss Rogue, MD as PCP - Cardiology (Cardiology) [facility], [facility] (Optometry) Dasher, Alm LABOR, MD (Dermatology)  I have updated your Care Teams any recent Medical Services you may have received from other providers in the past year.     Assessment:   This is a routine wellness examination for Alicia Ramos.  Hearing/Vision screen Hearing Screening - Comments:: No issues Vision Screening - Comments:: glasses   Goals Addressed             This Visit's Progress    Patient Stated       Wants to lose some weight and try not to over eat       Depression Screen     05/25/2024   11:40 AM 12/09/2023   12:14 PM 10/12/2023    8:20 AM 05/07/2023    9:12 AM 04/14/2023   11:15 AM 11/26/2022    8:08 AM 04/16/2022    8:11 AM  PHQ 2/9 Scores  PHQ - 2 Score 0 0 0 0 0 0 0  PHQ- 9 Score 1   0       Fall Risk  05/25/2024   11:37 AM 12/09/2023   12:14 PM 10/12/2023    8:20 AM 05/07/2023    9:15 AM 04/14/2023   11:15 AM  Fall Risk   Falls in the past year? 0 0 0 0 0  Number falls in past  yr: 0 0 0 0 0  Injury with Fall? 0 0 0 0 0  Risk for fall due to : No Fall Risks No Fall Risks No Fall Risks No Fall Risks No Fall Risks  Follow up Falls evaluation completed;Falls prevention discussed Falls evaluation completed Falls evaluation completed Falls prevention discussed Falls evaluation completed    MEDICARE RISK AT HOME:  Medicare Risk at Home Any stairs in or around the home?: Yes If so, are there any without handrails?: No Home free of loose throw rugs in walkways, pet beds, electrical cords, etc?: Yes Adequate lighting in your home to reduce risk of falls?: Yes Life alert?: No Use of a cane, walker or w/c?: No Grab bars in the bathroom?: No Shower chair or bench in shower?: No Elevated toilet seat or a handicapped toilet?: No  TIMED UP AND GO:  Was the test performed?  No  Cognitive Function: 6CIT completed    10/30/2017   11:41 AM  MMSE - Mini Mental State Exam  Orientation to time 5  Orientation to Place 5  Registration 3  Attention/ Calculation 5  Recall 3  Language- name 2 objects 2  Language- repeat 1  Language- follow 3 step command 3  Language- read & follow direction 1  Write a sentence 1  Copy design 1  Total score 30        05/25/2024   11:47 AM 05/07/2023    9:19 AM 03/07/2022    9:15 AM 03/06/2021    1:11 PM 03/05/2020   10:50 AM  6CIT Screen  What Year? 0 points 0 points 0 points 0 points 0 points  What month? 0 points 0 points 0 points 0 points 0 points  What time? 0 points 0 points 0 points 0 points 0 points  Count back from 20 0 points 0 points 0 points 0 points   Months in reverse 0 points 0 points 0 points 0 points   Repeat phrase 2 points 0 points 0 points 0 points   Total Score 2 points 0 points 0 points 0 points     Immunizations Immunization History  Administered Date(s) Administered   Fluad Quad(high Dose 65+) 05/07/2019   INFLUENZA, HIGH DOSE SEASONAL PF 07/04/2020   Influenza Split 08/15/2011   PFIZER(Purple  Top)SARS-COV-2 Vaccination 09/06/2019, 09/27/2019, 05/23/2020   Pneumococcal Conjugate-13 03/28/2015   Pneumococcal Polysaccharide-23 10/30/2017   Tdap 01/30/2018, 02/04/2019   Zoster Recombinant(Shingrix) 08/21/2020, 10/27/2020    Screening Tests Health Maintenance  Topic Date Due   Influenza Vaccine  02/26/2024   COVID-19 Vaccine (4 - 2025-26 season) 03/28/2024   HEMOGLOBIN A1C  04/13/2024   Medicare Annual Wellness (AWV)  05/06/2024   OPHTHALMOLOGY EXAM  09/24/2024   Diabetic kidney evaluation - Urine ACR  10/11/2024   FOOT EXAM  10/11/2024   Mammogram  10/19/2024   Diabetic kidney evaluation - eGFR measurement  11/10/2024   DEXA SCAN  10/19/2025   Colonoscopy  07/16/2027   DTaP/Tdap/Td (3 - Td or Tdap) 02/03/2029   Pneumococcal Vaccine: 50+ Years  Completed   Hepatitis C Screening  Completed   Zoster Vaccines- Shingrix  Completed   Meningococcal B Vaccine  Aged Out    Health Maintenance Items  Addressed: Patient declines flu and covid vaccines. Patient needs A1C at next office visit.  Additional Screening:  Vision Screening: Recommended annual ophthalmology exams for early detection of glaucoma and other disorders of the eye. Is the patient up to date with their annual eye exam?  Yes  Who is the provider or what is the name of the office in which the patient attends annual eye exams?  [facility]  Dental Screening: Recommended annual dental exams for proper oral hygiene  Community Resource Referral / Chronic Care Management: CRR required this visit?  No   CCM required this visit?  No   Plan:    I have personally reviewed and noted the following in the patient's chart:   Medical and social history Use of alcohol, tobacco or illicit drugs  Current medications and supplements including opioid prescriptions. Patient is not currently taking opioid prescriptions. Functional ability and status Nutritional status Physical activity Advanced directives List of  other physicians Hospitalizations, surgeries, and ER visits in previous 12 months Vitals Screenings to include cognitive, depression, and falls Referrals and appointments  In addition, I have reviewed and discussed with patient certain preventive protocols, quality metrics, and best practice recommendations. A written personalized care plan for preventive services as well as general preventive health recommendations were provided to patient.   Angeline Fredericks, LPN   [id number]   After Visit Summary: (Pick Up) Due to this being a telephonic visit, with patients personalized plan was offered to patient and patient has requested to Pick up at office.  Notes: Nothing significant to report at this time.  Patient needs A1C at next office visit.

## 2024-05-25 NOTE — Patient Instructions (Signed)
 Alicia Ramos,  Thank you for taking the time for your Medicare Wellness Visit. I appreciate your continued commitment to your health goals. Please review the care plan we discussed, and feel free to reach out if I can assist you further.  Medicare recommends these wellness visits once per year to help you and your care team stay ahead of potential health issues. These visits are designed to focus on prevention, allowing your provider to concentrate on managing your acute and chronic conditions during your regular appointments.  Please note that Annual Wellness Visits do not include a physical exam. Some assessments may be limited, especially if the visit was conducted virtually. If needed, we may recommend a separate in-person follow-up with your provider.  Ongoing Care Seeing your primary care provider every 3 to 6 months helps us  monitor your health and provide consistent, personalized care.  Consider updating your flu and covid vaccines.   Referrals If a referral was made during today's visit and you haven't received any updates within two weeks, please contact the referred provider directly to check on the status.  Recommended Screenings:  Health Maintenance  Topic Date Due   COVID-19 Vaccine (4 - 2025-26 season) 03/28/2024   Hemoglobin A1C  04/13/2024   Flu Shot  10/25/2024*   Eye exam for diabetics  09/24/2024   Yearly kidney health urinalysis for diabetes  10/11/2024   Complete foot exam   10/11/2024   Breast Cancer Screening  10/19/2024   Yearly kidney function blood test for diabetes  11/10/2024   Medicare Annual Wellness Visit  05/25/2025   DEXA scan (bone density measurement)  10/19/2025   Colon Cancer Screening  07/16/2027   DTaP/Tdap/Td vaccine (3 - Td or Tdap) 02/03/2029   Pneumococcal Vaccine for age over 71  Completed   Hepatitis C Screening  Completed   Zoster (Shingles) Vaccine  Completed   Meningitis B Vaccine  Aged Out  *Topic was postponed. The date shown is not  the original due date.       05/25/2024   11:47 AM  Advanced Directives  Does Patient Have a Medical Advance Directive? Yes  Type of Estate Agent of [place];Living will  Copy of Healthcare Power of Attorney in Chart? No - copy requested   Advance Care Planning is important because it: Ensures you receive medical care that aligns with your values, goals, and preferences. Provides guidance to your family and loved ones, reducing the emotional burden of decision-making during critical moments.  Vision: Annual vision screenings are recommended for early detection of glaucoma, cataracts, and diabetic retinopathy. These exams can also reveal signs of chronic conditions such as diabetes and high blood pressure.  Dental: Annual dental screenings help detect early signs of oral cancer, gum disease, and other conditions linked to overall health, including heart disease and diabetes.  Please see the attached documents for additional preventive care recommendations.

## 2024-05-30 ENCOUNTER — Ambulatory Visit: Admitting: Family

## 2024-05-30 ENCOUNTER — Encounter: Payer: Self-pay | Admitting: Family

## 2024-05-30 VITALS — BP 140/80 | HR 73 | Temp 97.5°F | Ht 68.0 in | Wt 196.6 lb

## 2024-05-30 DIAGNOSIS — Z7984 Long term (current) use of oral hypoglycemic drugs: Secondary | ICD-10-CM

## 2024-05-30 DIAGNOSIS — M8580 Other specified disorders of bone density and structure, unspecified site: Secondary | ICD-10-CM | POA: Diagnosis not present

## 2024-05-30 DIAGNOSIS — E119 Type 2 diabetes mellitus without complications: Secondary | ICD-10-CM

## 2024-05-30 DIAGNOSIS — R899 Unspecified abnormal finding in specimens from other organs, systems and tissues: Secondary | ICD-10-CM

## 2024-05-30 DIAGNOSIS — I10 Essential (primary) hypertension: Secondary | ICD-10-CM | POA: Diagnosis not present

## 2024-05-30 LAB — BASIC METABOLIC PANEL WITH GFR
BUN: 16 mg/dL (ref 6–23)
CO2: 26 meq/L (ref 19–32)
Calcium: 9.6 mg/dL (ref 8.4–10.5)
Chloride: 99 meq/L (ref 96–112)
Creatinine, Ser: 0.95 mg/dL (ref 0.40–1.20)
GFR: 59.05 mL/min — ABNORMAL LOW (ref >60.00)
Glucose, Bld: 82 mg/dL (ref 70–99)
Potassium: 4.2 meq/L (ref 3.5–5.1)
Sodium: 134 meq/L — ABNORMAL LOW (ref 135–145)

## 2024-05-30 LAB — T4, FREE: Free T4: 0.66 ng/dL (ref 0.60–1.60)

## 2024-05-30 LAB — VITAMIN D 25 HYDROXY (VIT D DEFICIENCY, FRACTURES): VITD: 29.8 ng/mL — ABNORMAL LOW (ref 30.00–100.00)

## 2024-05-30 LAB — T3, FREE: T3, Free: 3.1 pg/mL (ref 2.3–4.2)

## 2024-05-30 LAB — HEMOGLOBIN A1C: Hgb A1c MFr Bld: 6.5 % (ref 4.6–6.5)

## 2024-05-30 NOTE — Progress Notes (Signed)
 Assessment & Plan:  Primary hypertension Assessment & Plan: Chronic, stable. Continue  amlodipine  7.5 mg, Coreg  6.25 mg twice daily.  Blood pressure previously well-controlled.  Discussed exercise, low-sodium diet and emphasize lifestyle measures in terms of controlling blood pressure.  Discussed JNC 8 guidance of blood pressure less than 140/80.  Patient will keep blood pressure log and let me know escalations.  Orders: -     Basic metabolic panel with GFR  Abnormal laboratory test -     Basic metabolic panel with GFR -     T3, free -     T4, free  Osteopenia, unspecified location -     VITAMIN D  25 Hydroxy (Vit-D Deficiency, Fractures)  Diabetes mellitus without complication (HCC) Assessment & Plan: Chronic, stable. Continue metformin  1000mg  every day.   Orders: -     Hemoglobin A1c     Return precautions given.   Risks, benefits, and alternatives of the medications and treatment plan prescribed today were discussed, and patient expressed understanding.   Education regarding symptom management and diagnosis given to patient on AVS either electronically or printed.  Return in about 6 months (around 11/27/2024).  Rollene Northern, FNP  Subjective:    Patient ID: Alicia Ramos, female    DOB: [birth year], 73 y.o.   MRN: [id number]  CC: Alicia Ramos is a 73 y.o. female who presents today for follow up.   HPI: HPI Discussed the use of AI scribe software for clinical note transcription with the patient, who gave verbal consent to proceed.  History of Present Illness   Alicia Ramos is a 73 year old female with hypertension who presents for follow-up regarding her blood pressure management.  She has experienced elevated blood pressure readings, particularly with systolic values in the 150s during her recent cataract surgery.   Denies CP, sob  Her current medications include amlodipine  7.5 mg daily and carvedilol  6.25 mg twice daily. She experienced leg swelling when  the amlodipine  dose was increased to 10 mg in the past. H/o cough on lisinopril  and didn't feel well on hydrochlorothiazide .     Her lifestyle includes exercise, such as walking around her neighborhood at her own pace, and she recently consumed a kale smoothie with berries.       Allergies: Ace inhibitors, Losartan , and Statins Current Outpatient Medications on File Prior to Visit  Medication Sig Dispense Refill   acetaminophen (TYLENOL) 500 MG tablet Take 500 mg by mouth every 6 (six) hours as needed.     amLODipine  (NORVASC ) 2.5 MG tablet TAKE 1 TABLET BY MOUTH EVERY DAY 90 tablet 3   amLODipine  (NORVASC ) 5 MG tablet TAKE 1 TABLET (5 MG TOTAL) BY MOUTH DAILY. 90 tablet 1   aspirin  EC 81 MG tablet Take 1 tablet (81 mg total) by mouth daily. Swallow whole.     carvedilol  (COREG ) 6.25 MG tablet TAKE 1 TABLET BY MOUTH TWICE A DAY WITH FOOD 180 tablet 2   fenofibrate  (TRICOR ) 145 MG tablet TAKE 1 TABLET BY MOUTH EVERY DAY 90 tablet 3   metFORMIN  (GLUCOPHAGE -XR) 500 MG 24 hr tablet TAKE 3 TABLETS (1,500 MG TOTAL) BY MOUTH EVERY EVENING. 270 tablet 1   Multiple Vitamins-Minerals (CENTRUM SILVER WOMEN 50+) TABS Take 1 tablet by mouth daily.     rosuvastatin  (CRESTOR ) 20 MG tablet TAKE 1 TABLET BY MOUTH EVERY DAY 90 tablet 3   VITAMIN D  PO Take 800 mg by mouth.     No current facility-administered medications on file  prior to visit.    Review of Systems  Constitutional:  Negative for chills and fever.  Respiratory:  Negative for cough.   Cardiovascular:  Negative for chest pain and palpitations.  Gastrointestinal:  Negative for nausea and vomiting.      Objective:    BP (!) 140/80 Comment: left arm  Pulse 73   Temp (!) 97.5 F (36.4 C) (Oral)   Ht 5' 8 (1.727 m)   Wt 196 lb 9.6 oz (89.2 kg)   SpO2 96%   BMI 29.89 kg/m  BP Readings from Last 3 Encounters:  05/30/24 (!) 140/80  04/28/24 108/60  04/14/24 111/63   Wt Readings from Last 3 Encounters:  05/30/24 196 lb 9.6 oz  (89.2 kg)  05/25/24 200 lb (90.7 kg)  04/28/24 200 lb (90.7 kg)    Physical Exam Vitals reviewed.  Constitutional:      Appearance: She is well-developed.  Eyes:     Conjunctiva/sclera: Conjunctivae normal.  Cardiovascular:     Rate and Rhythm: Normal rate and regular rhythm.     Pulses: Normal pulses.     Heart sounds: Normal heart sounds.  Pulmonary:     Effort: Pulmonary effort is normal.     Breath sounds: Normal breath sounds. No wheezing, rhonchi or rales.  Skin:    General: Skin is warm and dry.  Neurological:     Mental Status: She is alert.  Psychiatric:        Speech: Speech normal.        Behavior: Behavior normal.        Thought Content: Thought content normal.

## 2024-05-30 NOTE — Assessment & Plan Note (Signed)
 Chronic, stable. Continue metformin 1000mg  every day.

## 2024-05-30 NOTE — Assessment & Plan Note (Signed)
 Chronic, stable. Continue  amlodipine  7.5 mg, Coreg  6.25 mg twice daily.  Blood pressure previously well-controlled.  Discussed exercise, low-sodium diet and emphasize lifestyle measures in terms of controlling blood pressure.  Discussed JNC 8 guidance of blood pressure less than 140/80.  Patient will keep blood pressure log and let me know escalations.

## 2024-05-30 NOTE — Patient Instructions (Signed)
 Goal of blood pressure less than 140/80  We can consider increasing carvedilol .     if persistently higher, please make sooner follow up appointment so we can recheck you blood pressure and manage/ adjust medications.   Managing Your Hypertension Hypertension, also called high blood pressure, is when the force of the blood pressing against the walls of the arteries is too strong. Arteries are blood vessels that carry blood from your heart throughout your body. Hypertension forces the heart to work harder to pump blood and may cause the arteries to become narrow or stiff. Understanding blood pressure readings A blood pressure reading includes a higher number over a lower number: The first, or top, number is called the systolic pressure. It is a measure of the pressure in your arteries as your heart beats. The second, or bottom number, is called the diastolic pressure. It is a measure of the pressure in your arteries as the heart relaxes. For most people, a normal blood pressure is below 120/80. Your personal target blood pressure may vary depending on your medical conditions, your age, and other factors. Blood pressure is classified into four stages. Based on your blood pressure reading, your health care provider may use the following stages to determine what type of treatment you need, if any. Systolic pressure and diastolic pressure are measured in a unit called millimeters of mercury (mmHg). Normal Systolic pressure: below 120. Diastolic pressure: below 80. Elevated Systolic pressure: 120-129. Diastolic pressure: below 80. Hypertension stage 1 Systolic pressure: 130-139. Diastolic pressure: 80-89. Hypertension stage 2 Systolic pressure: 140 or above. Diastolic pressure: 90 or above. How can this condition affect me? Managing your hypertension is very important. Over time, hypertension can damage the arteries and decrease blood flow to parts of the body, including the brain, heart, and  kidneys. Having untreated or uncontrolled hypertension can lead to: A heart attack. A stroke. A weakened blood vessel (aneurysm). Heart failure. Kidney damage. Eye damage. Memory and concentration problems. Vascular dementia. What actions can I take to manage this condition? Hypertension can be managed by making lifestyle changes and possibly by taking medicines. Your health care provider will help you make a plan to bring your blood pressure within a normal range. You may be referred for counseling on a healthy diet and physical activity. Nutrition  Eat a diet that is high in fiber and potassium, and low in salt (sodium), added sugar, and fat. An example eating plan is called the DASH diet. DASH stands for Dietary Approaches to Stop Hypertension. To eat this way: Eat plenty of fresh fruits and vegetables. Try to fill one-half of your plate at each meal with fruits and vegetables. Eat whole grains, such as whole-wheat pasta, brown rice, or whole-grain bread. Fill about one-fourth of your plate with whole grains. Eat low-fat dairy products. Avoid fatty cuts of meat, processed or cured meats, and poultry with skin. Fill about one-fourth of your plate with lean proteins such as fish, chicken without skin, beans, eggs, and tofu. Avoid pre-made and processed foods. These tend to be higher in sodium, added sugar, and fat. Reduce your daily sodium intake. Many people with hypertension should eat less than 1,500 mg of sodium a day. Lifestyle  Work with your health care provider to maintain a healthy body weight or to lose weight. Ask what an ideal weight is for you. Get at least 30 minutes of exercise that causes your heart to beat faster (aerobic exercise) most days of the week. Activities may include walking,  swimming, or biking. Include exercise to strengthen your muscles (resistance exercise), such as weight lifting, as part of your weekly exercise routine. Try to do these types of exercises for  30 minutes at least 3 days a week. Do not use any products that contain nicotine or tobacco. These products include cigarettes, chewing tobacco, and vaping devices, such as e-cigarettes. If you need help quitting, ask your health care provider. Control any long-term (chronic) conditions you have, such as high cholesterol or diabetes. Identify your sources of stress and find ways to manage stress. This may include meditation, deep breathing, or making time for fun activities. Alcohol use Do not drink alcohol if: Your health care provider tells you not to drink. You are pregnant, may be pregnant, or are planning to become pregnant. If you drink alcohol: Limit how much you have to: 0-1 drink a day for women. 0-2 drinks a day for men. Know how much alcohol is in your drink. In the [place] one drink equals one 12 oz bottle of beer (355 mL), one 5 oz glass of wine (148 mL), or one 1 oz glass of hard liquor (44 mL). Medicines Your health care provider may prescribe medicine if lifestyle changes are not enough to get your blood pressure under control and if: Your systolic blood pressure is 130 or higher. Your diastolic blood pressure is 80 or higher. Take medicines only as told by your health care provider. Follow the directions carefully. Blood pressure medicines must be taken as told by your health care provider. The medicine does not work as well when you skip doses. Skipping doses also puts you at risk for problems. Monitoring Before you monitor your blood pressure: Do not smoke, drink caffeinated beverages, or exercise within 30 minutes before taking a measurement. Use the bathroom and empty your bladder (urinate). Sit quietly for at least 5 minutes before taking measurements. Monitor your blood pressure at home as told by your health care provider. To do this: Sit with your back straight and supported. Place your feet flat on the floor. Do not cross your legs. Support your arm on a flat  surface, such as a table. Make sure your upper arm is at heart level. Each time you measure, take two or three readings one minute apart and record the results. You may also need to have your blood pressure checked regularly by your health care provider. General information Talk with your health care provider about your diet, exercise habits, and other lifestyle factors that may be contributing to hypertension. Review all the medicines you take with your health care provider because there may be side effects or interactions. Keep all follow-up visits. Your health care provider can help you create and adjust your plan for managing your high blood pressure. Where to find more information National Heart, Lung, and Blood Institute: popsteam.is American Heart Association: www.heart.org Contact a health care provider if: You think you are having a reaction to medicines you have taken. You have repeated (recurrent) headaches. You feel dizzy. You have swelling in your ankles. You have trouble with your vision. Get help right away if: You develop a severe headache or confusion. You have unusual weakness or numbness, or you feel faint. You have severe pain in your chest or abdomen. You vomit repeatedly. You have trouble breathing. These symptoms may be an emergency. Get help right away. Call 911. Do not wait to see if the symptoms will go away. Do not drive yourself to the hospital. Summary Hypertension is  when the force of blood pumping through your arteries is too strong. If this condition is not controlled, it may put you at risk for serious complications. Your personal target blood pressure may vary depending on your medical conditions, your age, and other factors. For most people, a normal blood pressure is less than 120/80. Hypertension is managed by lifestyle changes, medicines, or both. Lifestyle changes to help manage hypertension include losing weight, eating a healthy, low-sodium  diet, exercising more, stopping smoking, and limiting alcohol. This information is not intended to replace advice given to you by your health care provider. Make sure you discuss any questions you have with your health care provider. Document Revised: 03/28/2021 Document Reviewed: 03/28/2021 Elsevier Patient Education  2024 [employer].

## 2024-06-07 ENCOUNTER — Ambulatory Visit: Payer: Self-pay | Admitting: Family

## 2024-06-07 DIAGNOSIS — E559 Vitamin D deficiency, unspecified: Secondary | ICD-10-CM

## 2024-06-07 MED ORDER — CHOLECALCIFEROL 1.25 MG (50000 UT) PO TABS: ORAL_TABLET | ORAL | 0 refills | Status: AC

## 2024-06-08 NOTE — Telephone Encounter (Signed)
 Copied from CRM [id number]. Topic: Clinical - Lab/Test Results >> Jun 08, 2024  9:40 AM Alicia Ramos wrote: Reason for CRM: Patient is following back up on a script for vitamin D  that was sent in for her. Please reach out, looks like it was sent in based on labs. [phone] Your vitamin D  level remains low.  You may start prescription for vitamin  D 50000 units by mouth ONCE weekly for 8 weeks only. I have sent this to your pharmacy. After 8 weeks, you MAY stop this dose and resume over the counter cholecalciferol 800 units daily.  I will not refill vitamin d  50,000 units until vitamin D  is rechecked as vitamin d  is toxic if taking more than needed

## 2024-07-26 ENCOUNTER — Other Ambulatory Visit: Payer: Self-pay | Admitting: Family

## 2024-07-26 DIAGNOSIS — I10 Essential (primary) hypertension: Secondary | ICD-10-CM

## 2024-07-26 DIAGNOSIS — E119 Type 2 diabetes mellitus without complications: Secondary | ICD-10-CM

## 2025-06-05 ENCOUNTER — Ambulatory Visit
